# Patient Record
Sex: Male | Born: 1945 | Race: Black or African American | Hispanic: No | Smoking: Former smoker
Health system: Southern US, Community
[De-identification: ages and names within clinical notes are randomized; demographics above are authoritative.]

## PROBLEM LIST (undated history)

## (undated) DIAGNOSIS — E119 Type 2 diabetes mellitus without complications: Secondary | ICD-10-CM

## (undated) DIAGNOSIS — N400 Enlarged prostate without lower urinary tract symptoms: Secondary | ICD-10-CM

## (undated) DIAGNOSIS — I10 Essential (primary) hypertension: Secondary | ICD-10-CM

## (undated) DIAGNOSIS — C61 Malignant neoplasm of prostate: Secondary | ICD-10-CM

## (undated) DIAGNOSIS — Z87442 Personal history of urinary calculi: Secondary | ICD-10-CM

## (undated) DIAGNOSIS — Z720 Tobacco use: Secondary | ICD-10-CM

## (undated) DIAGNOSIS — F1010 Alcohol abuse, uncomplicated: Secondary | ICD-10-CM

## (undated) HISTORY — DX: Alcohol abuse, uncomplicated: F10.10

## (undated) HISTORY — DX: Essential (primary) hypertension: I10

## (undated) HISTORY — PX: APPENDECTOMY: SHX54

## (undated) HISTORY — DX: Malignant neoplasm of prostate: C61

## (undated) HISTORY — DX: Personal history of urinary calculi: Z87.442

## (undated) HISTORY — PX: RADIOACTIVE SEED IMPLANT: SHX5150

## (undated) HISTORY — PX: LITHOTRIPSY: SUR834

## (undated) HISTORY — DX: Benign prostatic hyperplasia without lower urinary tract symptoms: N40.0

## (undated) HISTORY — DX: Tobacco use: Z72.0

---

## 2003-04-27 ENCOUNTER — Ambulatory Visit: Admission: RE | Admit: 2003-04-27 | Discharge: 2003-07-26 | Payer: Self-pay | Admitting: Radiation Oncology

## 2003-05-06 ENCOUNTER — Encounter: Admission: RE | Admit: 2003-05-06 | Discharge: 2003-05-06 | Payer: Self-pay | Admitting: Urology

## 2003-06-18 DIAGNOSIS — C61 Malignant neoplasm of prostate: Secondary | ICD-10-CM

## 2003-06-18 HISTORY — DX: Malignant neoplasm of prostate: C61

## 2003-06-23 ENCOUNTER — Ambulatory Visit (HOSPITAL_COMMUNITY): Admission: RE | Admit: 2003-06-23 | Discharge: 2003-06-23 | Payer: Self-pay | Admitting: Urology

## 2003-06-23 ENCOUNTER — Ambulatory Visit (HOSPITAL_BASED_OUTPATIENT_CLINIC_OR_DEPARTMENT_OTHER): Admission: RE | Admit: 2003-06-23 | Discharge: 2003-06-23 | Payer: Self-pay | Admitting: Urology

## 2003-07-09 ENCOUNTER — Emergency Department (HOSPITAL_COMMUNITY): Admission: EM | Admit: 2003-07-09 | Discharge: 2003-07-09 | Payer: Self-pay | Admitting: Emergency Medicine

## 2006-02-14 ENCOUNTER — Inpatient Hospital Stay (HOSPITAL_COMMUNITY): Admission: AC | Admit: 2006-02-14 | Discharge: 2006-02-17 | Payer: Self-pay

## 2008-03-01 ENCOUNTER — Observation Stay (HOSPITAL_COMMUNITY): Admission: EM | Admit: 2008-03-01 | Discharge: 2008-03-01 | Payer: Self-pay | Admitting: Emergency Medicine

## 2008-07-26 ENCOUNTER — Encounter: Admission: RE | Admit: 2008-07-26 | Discharge: 2008-07-26 | Payer: Self-pay | Admitting: Family Medicine

## 2009-05-29 ENCOUNTER — Emergency Department (HOSPITAL_COMMUNITY): Admission: EM | Admit: 2009-05-29 | Discharge: 2009-05-29 | Payer: Self-pay | Admitting: Emergency Medicine

## 2010-02-19 ENCOUNTER — Observation Stay (HOSPITAL_COMMUNITY): Admission: EM | Admit: 2010-02-19 | Discharge: 2010-02-21 | Payer: Self-pay | Admitting: Emergency Medicine

## 2010-02-19 ENCOUNTER — Ambulatory Visit: Payer: Self-pay | Admitting: Internal Medicine

## 2010-02-20 ENCOUNTER — Encounter: Payer: Self-pay | Admitting: Internal Medicine

## 2010-02-20 LAB — CONVERTED CEMR LAB: PSA: 0.04 ng/mL

## 2010-02-23 ENCOUNTER — Encounter: Payer: Self-pay | Admitting: Internal Medicine

## 2010-02-23 DIAGNOSIS — C61 Malignant neoplasm of prostate: Secondary | ICD-10-CM | POA: Insufficient documentation

## 2010-02-23 DIAGNOSIS — F1010 Alcohol abuse, uncomplicated: Secondary | ICD-10-CM | POA: Insufficient documentation

## 2010-02-23 DIAGNOSIS — I10 Essential (primary) hypertension: Secondary | ICD-10-CM | POA: Insufficient documentation

## 2010-02-23 DIAGNOSIS — F17200 Nicotine dependence, unspecified, uncomplicated: Secondary | ICD-10-CM

## 2010-02-23 DIAGNOSIS — Z87898 Personal history of other specified conditions: Secondary | ICD-10-CM

## 2010-02-23 DIAGNOSIS — R55 Syncope and collapse: Secondary | ICD-10-CM

## 2010-02-26 ENCOUNTER — Ambulatory Visit: Payer: Self-pay | Admitting: Internal Medicine

## 2010-03-09 ENCOUNTER — Ambulatory Visit: Payer: Self-pay | Admitting: Internal Medicine

## 2010-03-10 LAB — CONVERTED CEMR LAB
HDL: 63 mg/dL (ref >39)
Triglycerides: 126 mg/dL (ref <150)

## 2010-03-27 ENCOUNTER — Telehealth: Payer: Self-pay | Admitting: Internal Medicine

## 2010-04-09 ENCOUNTER — Telehealth: Payer: Self-pay | Admitting: Internal Medicine

## 2010-07-17 NOTE — Progress Notes (Signed)
Summary: med refill/gp  Phone Note Refill Request Message from:  Hospice nurse on April 09, 2010 9:50 AM  Refills Requested: Medication #1:  METOPROLOL TARTRATE 50 MG TABS Take 1 tablet by mouth two times a day  Method Requested: Electronic Initial call taken by: Chinita Pester RN,  April 09, 2010 9:50 AM  Follow-up for Phone Call        Rx faxed to pharmacy Follow-up by: Mariea Stable MD,  April 09, 2010 10:38 AM    Prescriptions: METOPROLOL TARTRATE 50 MG TABS (METOPROLOL TARTRATE) Take 1 tablet by mouth two times a day  #60 x 2   Entered and Authorized by:   Mariea Stable MD   Signed by:   Mariea Stable MD on 04/09/2010   Method used:   [facility]        [facility]  [address] [phone]* (retail)       [address]       [place], [place]  [id number]       Ph: [phone] or [phone]       Fax: [phone]   RxID:   [id number]

## 2010-07-17 NOTE — Miscellaneous (Signed)
Summary: Clinical Problems Update - from hospital discharge 02/21/10  Clinical Lists Changes  Problems: Added new problem of ALCOHOL ABUSE (ICD-305.00) Added new problem of ESSENTIAL HYPERTENSION (ICD-401.9) Added new problem of BENIGN PROSTATIC HYPERTROPHY, HX OF (ICD-V13.8) Added new problem of Hospitalized for  SYNCOPE (ICD-780.2) - Thought to be vasovagal and/or orthostatic in nature secondary to increased dosage of doxazosin and dehydration Added new problem of ADENOCARCINOMA, PROSTATE (ICD-185) Added new problem of TOBACCO ABUSE (ICD-305.1) Medications: Added new medication of FLOMAX 0.4 MG CAPS (TAMSULOSIN HCL) Take 1 tablet by mouth once a day Added new medication of FINASTERIDE 5 MG TABS (FINASTERIDE) Take 1 tablet by mouth once a day Added new medication of METOPROLOL TARTRATE 50 MG TABS (METOPROLOL TARTRATE) Take 1 tablet by mouth two times a day Added new medication of LISINOPRIL 40 MG TABS (LISINOPRIL) Take 1 tablet by mouth once a day Observations: Added new observation of NKA: T (02/23/2010 15:46) Added new observation of PSA: 0.04 ng/mL (02/20/2010 15:46)

## 2010-07-17 NOTE — Progress Notes (Signed)
Summary: med refill/gp  Phone Note Refill Request Message from:  Fax from Pharmacy on March 27, 2010 9:52 AM  Refills Requested: Medication #1:  FINASTERIDE 5 MG TABS Take 1 tablet by mouth once a day   Last Refilled: 02/21/2010  Medication #2:  FLOMAX 0.4 MG CAPS Take 1 tablet by mouth once a day   Last Refilled: 02/21/2010 Last appt/ 9/12.   Method Requested: Electronic Initial call taken by: Chinita Pester RN,  March 27, 2010 9:52 AM  Follow-up for Phone Call        Rx faxed to pharmacy Follow-up by: Mariea Stable MD,  March 27, 2010 10:04 AM    Prescriptions: FINASTERIDE 5 MG TABS (FINASTERIDE) Take 1 tablet by mouth once a day  #30 x 2   Entered and Authorized by:   Mariea Stable MD   Signed by:   Mariea Stable MD on 03/27/2010   Method used:   [facility]        [facility] [phone]* (retail)       [address]       [place], [place]  [address]       Ph: [phone] or [phone]       Fax: [phone]   RxID:   [id number] FLOMAX 0.4 MG CAPS (TAMSULOSIN HCL) Take 1 tablet by mouth once a day  #30 x 2   Entered and Authorized by:   Mariea Stable MD   Signed by:   Mariea Stable MD on 03/27/2010   Method used:   [facility]        [facility]  [address] [phone]* (retail)       [address]       [place], [place]  [id number]       Ph: [phone] or [phone]       Fax: [phone]   RxID:   [id number]

## 2010-07-17 NOTE — Assessment & Plan Note (Signed)
Summary: HFU for syncope, HTN, BPH   Vital Signs:  Patient profile:   65 year old male Height:      66 inches (167.64 cm) Weight:      136.1 pounds (61.86 kg) BMI:     22.05 Temp:     97.0 degrees F (36.11 degrees C) oral Pulse rate:   68 / minute BP supine:   136 / 88 BP sitting:   138 / 86  (left arm) BP standing:   128 / 82 Cuff size:   regular  Vitals Entered By: Theotis Barrio NT II (February 26, 2010 2:08 PM) CC: Hospital follow-up of syncope, requesting flu shot today.  Is Patient Diabetic? No Pain Assessment Patient in pain? no      Nutritional Status BMI of 19 -24 = normal  Have you ever been in a relationship where you felt threatened, hurt or afraid?No   Does patient need assistance? Functional Status Self care Ambulation Normal Comments HOSPITAL FOLLOW UP APPT   Primary Care Provider:  Johnette Abraham DO  CC:  Hospital follow-up of syncope and requesting flu shot today. Marland Kitchen  History of Present Illness: Patient is a 65 year old man with a PMHx of HTN, BPH, remote history of prostate cancer, and recent hospitalization for syncope 09/05-09/07/11, who presents to clinic today to establish care and for hospital follow-up of the following conditions:  1) Hospital follow-up: Patient was hospitalized from 09/05-09/07/11 at [facility] for workup of syncope x2 episodes that occured after patient urinated. Prior to admission patient admitted to increasing dosage of Doxazosin to 8mg  twice daily, and had multiple episodes of diarrhea and vomiting after syncopal episodes. Cardiac enzymes negative x 3 . Orthostatic vital signs inpatient were positive, and syncopal episodes contributed to by increased medication and orthostatic hypotension due to volume depletion (secondary to diarrhea and vomiting). Medications were adjusted inpatient (discontinued of Amlodipine 10mg  daily, Metoprolol-HCTZ 50-25mg  daily, Doxazosin 4mg  qHS, Flexeril), Patient also notes he did not continue with  discharge Lisinopril because he was not given a prescription for this medication.  Since discharge, patient states he is feeling much better without recurrent episodes of dizziness or lightheadedness, no syncopal episodes. No diarrhea, vomiting, abdominal pain.  2) BPH - states previously having difficulty with urination, which is why he had increased his Doxazosin dosage. However, since changing to Finasteride and Tamsulosin, he is having good stream, no difficulty voiding, no feeling of urinary retention, dysuria, hematuria.  3) Hypertension - taking Metoprolol two times a day, does not check blood pressure regularly. No chest pain, difficulty breathing, shortness of breath, claudication.   Preventive Screening-Counseling & Management  Alcohol-Tobacco     Smoking Status: current  Caffeine-Diet-Exercise     Does Patient Exercise: yes     Type of exercise: WALKING     Times/week: 5      Drug Use:  no.    Current Medications (verified): 1)  Flomax 0.4 Mg Caps (Tamsulosin Hcl) .... Take 1 Tablet By Mouth Once A Day 2)  Finasteride 5 Mg Tabs (Finasteride) .... Take 1 Tablet By Mouth Once A Day 3)  Metoprolol Tartrate 50 Mg Tabs (Metoprolol Tartrate) .... Take 1 Tablet By Mouth Two Times A Day  Allergies (verified): No Known Drug Allergies  Past History:  Past Medical History: 1)  Hypertension since 2005 2)  Prostate Cancer - diagnosed 2005 Clinical stage T1C, Gleason grade 6.  - Status-post Transperineal radioactive seed implantation with I-125 isotope in 2005. - Followed yearly by oncology,  Dr. Laverle Patter, at [facility]  3)  Benign Prostatic Hypertrophy since 2000-2001 4)  Tobacco Abuse - off and on x 4-5 years (2005 start) - 1 pack per month 5)  Remote history of Alcohol abuse since teenage years - since 2010 drinking 40 oz beer daily (greater amount previously) 6)  Remote history of nephrolithiasis requiring lithotripsy - unknown date  Past Surgical History: None  Family  History: Father - deceased, unknown Mother - deceased, unknown 23 siblings - unknown medical problems Maternal 1st cousin - leukemia 5 kids (3 daughters, 2 sons) - no known medical problems   Social History: Occupation: Administrator, Civil Service - Corporate investment banker Married, 5 children Current Smoker Alcohol use-yes Drug use-no Regular exercise-yes Smoking Status:  current Does Patient Exercise:  yes Drug Use:  no  Review of Systems       Per HPI  Physical Exam  General:  Well-developed,well-nourished,in no acute distress; alert,appropriate and cooperative throughout examination Head:  Normocephalic and atraumatic without obvious abnormalities. No apparent alopecia or balding. Neck:  No deformities, masses, or tenderness noted. Lungs:  Normal respiratory effort, chest expands symmetrically. Lungs are clear to auscultation, no crackles or wheezes. Heart:  Normal rate and regular rhythm. S1 and S2 normal without gallop, murmur, click, rub or other extra sounds. Abdomen:  Bowel sounds positive,abdomen soft and non-tender without masses, organomegaly or hernias noted. Msk:  No deformity or scoliosis noted of thoracic or lumbar spine.   Pulses:  R and L carotid,radial,femoral,dorsalis pedis and posterior tibial pulses are full and equal bilaterally   Impression & Recommendations:  Problem # 1:  Hosp for SYNCOPE (ICD-780.2) No further syncopal episodes, dizziness, or lightheadedness. As detailed in discharge summary, syncopal episode was likely secondary to vasovagal versus orthostatic hypotension secondary to medication misuse and/or volume depletion secondary to diarrhea and vomiting. - Patient instructed to follow medication prescriptions as instructed and to avoid increasing medications without medical professional approval.  Problem # 2:  ESSENTIAL HYPERTENSION (ICD-401.9) Well controlled.  - Will continue to monitor and consider adding back Lisinopril as needed if blood pressure becomes  poorly controlled. - Will add Aspirin 81mg  TBEC daily  The following medications were removed from the medication list:    Lisinopril 40 Mg Tabs (Lisinopril) .Marland Kitchen... Take 1 tablet by mouth once a day His updated medication list for this problem includes:    Metoprolol Tartrate 50 Mg Tabs (Metoprolol tartrate) .Marland Kitchen... Take 1 tablet by mouth two times a day  Problem # 3:  BENIGN PROSTATIC HYPERTROPHY, HX OF (ICD-V13.8) Well controlled. Followed by Urology.  - Patient instructed to let Urologist know about medication changes encountered during hospitalization, since patient on multiple medications for BPH currently, may consider changing to either Finasteride or Tamsulosin per discretion of Urologist.  - Recommend keep urology follow-up 04/2010 - Requested paperwork from Urologist regarding PSA testing  Problem # 4:  TOBACCO ABUSE (ICD-305.1) Recommended to stop smoking and patient does not request medication help at this time, will let me know.  Problem # 5:  PREVENTIVE HEALTH CARE (ICD-V70.0) Requested paperwork from PCP, Urologist regarding health screening (including prior colonoscopy, PSA, tetanus, PPD results) - Will schedule for appropriate labs/ testing as indicated after records received and reviewed.  - Influenza and Pneumococcal vaccinations today - Plan Fasting Lipid Profile prior to next visit in 3 months.   Future Orders: T-Lipid Profile [phone]) ... 05/28/2010  Complete Medication List: 1)  Flomax 0.4 Mg Caps (Tamsulosin hcl) .... Take 1 tablet by mouth once a  day 2)  Finasteride 5 Mg Tabs (Finasteride) .... Take 1 tablet by mouth once a day 3)  Metoprolol Tartrate 50 Mg Tabs (Metoprolol tartrate) .... Take 1 tablet by mouth two times a day 4)  Aspir-low 81 Mg Tbec (Aspirin) .... Take 1 tablet by mouth once a day  Other Orders: Influenza Vaccine NON MCR (04540) Pneumococcal Vaccine (98119) Admin 1st Vaccine (14782)  Patient Instructions: 1)  Please follow-up with  your Urologist Dr. Laverle Patter at [facility] regarding your prostate cancer, prostate enlargement, and the medication changes that were made to your prostate medications.  2)  Please send paperwork from your Urology visit to our clinic. 3)  We will request paperwork to be sent from your old primary care provider, at which time we can determine when your next screening tests (including colonoscopy, PSA, etc.) need to be scheduled.  4)  Please continue current medications as directed. 5)  As discussed, please stop smoking, as it is very bad for your health. 6)  Please consider decreasing the amount of alcohol your currently use daily, as it can be bad for your health and liver.  7)  Please follow-up with me in 3 months time for a routine follow-up, before the visit, please get a fasting cholesterol panel (so you cannot eat or drink anything after midnight the night before your visit, then come in the morning for a visit). Prescriptions: ASPIR-LOW 81 MG TBEC (ASPIRIN) Take 1 tablet by mouth once a day  #30 x 0   Entered and Authorized by:   Johnette Abraham DO   Signed by:   Johnette Abraham DO on 02/26/2010   Method used:   Telephoned to ...         RxID:   [id number]  Process Orders Check Orders Results:     [facility]: ABN not required for this insurance Tests Sent for requisitioning (February 27, 2010 6:01 PM):     05/28/2010: [facility] -- T-Lipid Profile [phone] (signed)     Prevention & Chronic Care Immunizations   Influenza vaccine: Fluvax Non-MCR  (02/26/2010)    Tetanus booster: Not documented    Pneumococcal vaccine: Pneumovax  (02/26/2010)    H. zoster vaccine: Not documented  Colorectal Screening   Hemoccult: Not documented    Colonoscopy: Not documented  Other Screening   PSA: 0.04  (02/20/2010)   Smoking status: current  (02/26/2010)  Lipids   Total Cholesterol: Not documented   LDL: Not documented   LDL  Direct: Not documented   HDL: Not documented   Triglycerides: Not documented  Hypertension   Last Blood Pressure: 138 / 86  (02/26/2010)   Serum creatinine: Not documented   Serum potassium Not documented  Self-Management Support :   Personal Goals (by the next clinic visit) :      Personal blood pressure goal: 140/90  (02/26/2010)   Patient will work on the following items until the next clinic visit to reach self-care goals:     Medications and monitoring: take my medicines every day  (02/26/2010)     Eating: eat more vegetables, use fresh or frozen vegetables, eat foods that are low in salt, eat baked foods instead of fried foods, eat fruit for snacks and desserts, limit or avoid alcohol  (02/26/2010)     Activity: take a 30 minute walk every day  (02/26/2010)    Hypertension self-management support: Resources for patients handout  (02/26/2010)    Self-management comments: JPATIENT STATES THAT HE WALKS TO  WORK EVERYDAY      Resource handout printed.   Nursing Instructions: Give Flu vaccine today Give Pneumovax today    Pneumovax Vaccine    Vaccine Type: Pneumovax    Site: right deltoid    Mfr: Merck    Dose: 0.5 ml    Route: IM    Given by: Chinita Pester RN    Exp. Date: 08/30/2011    Lot #: [id number]    VIS given: 05/22/09 version given February 26, 2010.  Influenza Vaccine    Vaccine Type: Fluvax Non-MCR    Site: left deltoid    Mfr: GlaxoSmithKline    Dose: 0.5 ml    Route: IM    Given by: Chinita Pester RN    Exp. Date: 12/15/2010    Lot #: [id number]    VIS given: 01/09/10 version given February 26, 2010.  Flu Vaccine Consent Questions    Do you have a history of severe allergic reactions to this vaccine? no    Any prior history of allergic reactions to egg and/or gelatin? no    Do you have a sensitivity to the preservative Thimersol? no    Do you have a past history of Guillan-Barre Syndrome? no    Do you currently have an acute febrile illness? no     Have you ever had a severe reaction to latex? no    Vaccine information given and explained to patient? yes

## 2010-08-16 ENCOUNTER — Other Ambulatory Visit: Payer: Self-pay | Admitting: Internal Medicine

## 2010-08-30 LAB — BASIC METABOLIC PANEL
BUN: 9 mg/dL (ref 6–23)
Chloride: 99 mEq/L (ref 96–112)
GFR calc non Af Amer: >60 mL/min (ref >60)
Potassium: 3.7 mEq/L (ref 3.5–5.1)
Sodium: 138 mEq/L (ref 135–145)

## 2010-08-30 LAB — RAPID URINE DRUG SCREEN, HOSP PERFORMED
Amphetamines: NOT DETECTED
Barbiturates: NOT DETECTED
Benzodiazepines: NOT DETECTED
Cocaine: NOT DETECTED
Opiates: NOT DETECTED
Tetrahydrocannabinol: NOT DETECTED

## 2010-08-30 LAB — CBC
HCT: 38.7 % — ABNORMAL LOW (ref 39.0–52.0)
HCT: 40.4 % (ref 39.0–52.0)
Hemoglobin: 13.6 g/dL (ref 13.0–17.0)
Hemoglobin: 13.8 g/dL (ref 13.0–17.0)
MCH: 33.7 pg (ref 26.0–34.0)
MCH: 34.3 pg — ABNORMAL HIGH (ref 26.0–34.0)
MCHC: 34.2 g/dL (ref 30.0–36.0)
MCHC: 35.1 g/dL (ref 30.0–36.0)
MCV: 97.7 fL (ref 78.0–100.0)
MCV: 98.5 fL (ref 78.0–100.0)
Platelets: 186 K/uL (ref 150–400)
Platelets: 188 K/uL (ref 150–400)
RBC: 3.96 MIL/uL — ABNORMAL LOW (ref 4.22–5.81)
RBC: 4.1 MIL/uL — ABNORMAL LOW (ref 4.22–5.81)
RDW: 13 % (ref 11.5–15.5)
RDW: 13.2 % (ref 11.5–15.5)
WBC: 4.7 K/uL (ref 4.0–10.5)
WBC: 4.8 10*3/uL (ref 4.0–10.5)

## 2010-08-30 LAB — POCT CARDIAC MARKERS
CKMB, poc: <1 ng/mL — ABNORMAL LOW (ref 1.0–8.0)
Myoglobin, poc: 112 ng/mL (ref 12–200)
Troponin i, poc: <0.05 ng/mL (ref 0.00–0.09)

## 2010-08-30 LAB — CARDIAC PANEL(CRET KIN+CKTOT+MB+TROPI)
CK, MB: 1.9 ng/mL (ref 0.3–4.0)
CK, MB: 2.6 ng/mL (ref 0.3–4.0)
Relative Index: 1 (ref 0.0–2.5)
Relative Index: 1.1 (ref 0.0–2.5)
Total CK: 194 U/L (ref 7–232)
Total CK: 240 U/L — ABNORMAL HIGH (ref 7–232)
Troponin I: 0.01 ng/mL (ref 0.00–0.06)
Troponin I: 0.02 ng/mL (ref 0.00–0.06)

## 2010-08-30 LAB — BASIC METABOLIC PANEL WITH GFR
CO2: 32 meq/L (ref 19–32)
Calcium: 9.1 mg/dL (ref 8.4–10.5)
Creatinine, Ser: 1.1 mg/dL (ref 0.4–1.5)
GFR calc Af Amer: >60 mL/min (ref >60)
Glucose, Bld: 110 mg/dL — ABNORMAL HIGH (ref 70–99)

## 2010-08-30 LAB — ETHANOL: Alcohol, Ethyl (B): <5 mg/dL (ref 0–10)

## 2010-08-30 LAB — COMPREHENSIVE METABOLIC PANEL WITH GFR
ALT: 15 U/L (ref 0–53)
Albumin: 3.6 g/dL (ref 3.5–5.2)
Alkaline Phosphatase: 40 U/L (ref 39–117)
BUN: 11 mg/dL (ref 6–23)
Chloride: 98 meq/L (ref 96–112)
Glucose, Bld: 94 mg/dL (ref 70–99)
Potassium: 4 meq/L (ref 3.5–5.1)
Total Bilirubin: 0.7 mg/dL (ref 0.3–1.2)

## 2010-08-30 LAB — COMPREHENSIVE METABOLIC PANEL
AST: 24 U/L (ref 0–37)
CO2: 28 mEq/L (ref 19–32)
Calcium: 9 mg/dL (ref 8.4–10.5)
Creatinine, Ser: 1.15 mg/dL (ref 0.4–1.5)
GFR calc Af Amer: >60 mL/min (ref >60)
GFR calc non Af Amer: >60 mL/min (ref >60)
Sodium: 137 mEq/L (ref 135–145)
Total Protein: 6.6 g/dL (ref 6.0–8.3)

## 2010-08-30 LAB — DIFFERENTIAL
Basophils Absolute: 0 10*3/uL (ref 0.0–0.1)
Basophils Relative: 0 % (ref 0–1)
Eosinophils Absolute: 0.1 K/uL (ref 0.0–0.7)
Eosinophils Relative: 2 % (ref 0–5)
Lymphocytes Relative: 15 % (ref 12–46)
Lymphs Abs: 0.7 10*3/uL (ref 0.7–4.0)
Monocytes Absolute: 0.4 10*3/uL (ref 0.1–1.0)
Monocytes Relative: 7 % (ref 3–12)
Neutro Abs: 3.6 10*3/uL (ref 1.7–7.7)
Neutrophils Relative %: 75 % (ref 43–77)

## 2010-08-30 LAB — TROPONIN I: Troponin I: 0.01 ng/mL (ref 0.00–0.06)

## 2010-08-30 LAB — GLUCOSE, CAPILLARY: Glucose-Capillary: 123 mg/dL — ABNORMAL HIGH (ref 70–99)

## 2010-08-30 LAB — VITAMIN B12: Vitamin B-12: 225 pg/mL (ref 211–911)

## 2010-08-30 LAB — CK TOTAL AND CKMB (NOT AT ARMC)
CK, MB: 1.6 ng/mL (ref 0.3–4.0)
Total CK: 185 U/L (ref 7–232)

## 2010-09-18 LAB — CBC
HCT: 44 % (ref 39.0–52.0)
MCHC: 34.4 g/dL (ref 30.0–36.0)
Platelets: 141 10*3/uL — ABNORMAL LOW (ref 150–400)
RDW: 13.3 % (ref 11.5–15.5)

## 2010-09-18 LAB — COMPREHENSIVE METABOLIC PANEL
Albumin: 3.8 g/dL (ref 3.5–5.2)
Alkaline Phosphatase: 54 U/L (ref 39–117)
BUN: 15 mg/dL (ref 6–23)
Calcium: 9.4 mg/dL (ref 8.4–10.5)
Creatinine, Ser: 1.4 mg/dL (ref 0.4–1.5)
Glucose, Bld: 224 mg/dL — ABNORMAL HIGH (ref 70–99)
Potassium: 2.9 mEq/L — ABNORMAL LOW (ref 3.5–5.1)
Total Protein: 7.9 g/dL (ref 6.0–8.3)

## 2010-09-18 LAB — URINE MICROSCOPIC-ADD ON

## 2010-09-18 LAB — URINALYSIS, ROUTINE W REFLEX MICROSCOPIC
Glucose, UA: NEGATIVE mg/dL
Hgb urine dipstick: NEGATIVE
Ketones, ur: 15 mg/dL — AB
Leukocytes, UA: NEGATIVE
Nitrite: NEGATIVE
Protein, ur: 30 mg/dL — AB
Specific Gravity, Urine: 1.026 (ref 1.005–1.030)
Urobilinogen, UA: 1 mg/dL (ref 0.0–1.0)
pH: 5.5 (ref 5.0–8.0)

## 2010-09-18 LAB — DIFFERENTIAL
Lymphocytes Relative: 5 % — ABNORMAL LOW (ref 12–46)
Lymphs Abs: 0.3 10*3/uL — ABNORMAL LOW (ref 0.7–4.0)
Monocytes Absolute: 0.1 10*3/uL (ref 0.1–1.0)
Monocytes Relative: 2 % — ABNORMAL LOW (ref 3–12)
Neutro Abs: 5.1 10*3/uL (ref 1.7–7.7)
Neutrophils Relative %: 93 % — ABNORMAL HIGH (ref 43–77)

## 2010-09-18 LAB — MAGNESIUM: Magnesium: 1.5 mg/dL (ref 1.5–2.5)

## 2010-10-06 ENCOUNTER — Other Ambulatory Visit: Payer: Self-pay | Admitting: Internal Medicine

## 2010-10-11 ENCOUNTER — Other Ambulatory Visit: Payer: Self-pay | Admitting: Internal Medicine
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
## 2010-11-02 NOTE — Op Note (Signed)
NAMEVERDELL, DYKMAN                           ACCOUNT NO.:  [id number]   MEDICAL RECORD NO.:  [id number]                   PATIENT TYPE:  AMB   LOCATION:  [facility]                                 FACILITY:  [facility]   PHYSICIAN:  Claudette Laws, M.D.               DATE OF BIRTH:  05-26-1946   DATE OF PROCEDURE:  06/23/2003  DATE OF DISCHARGE:                                 OPERATIVE REPORT   PREOPERATIVE DIAGNOSIS:  Clinical stage T1C, Gleason grade 6 carcinoma of  the prostate gland.   POSTOPERATIVE DIAGNOSIS:  Clinical stage T1C, Gleason grade 6 carcinoma of  the prostate gland.   OPERATION:  1. Transperineal radioactive seed implantation with I-125 isotope.  2. Cystoscopy.   SURGEON:  Claudette Laws, M.D.   PROCEDURE:  The patient was prepped and draped in the dorsal lithotomy  position under spinal anesthesia.  A Foley catheter was placed.  A rectal  tube was in place, and then with Dr. Chipper Herb, radiation oncologist,  using the preop volume study, seeds were placed transperineal in the  standard fashion using the ultrasound rectal probe and over-arm C-arm for  placement.  A total of 27 needles were used and 86 seeds.  The procedure  went well.  At the end of the procedure cystoscopy was performed.  He had a  normal anterior urethra and normal prostate.  The bladder itself looked  normal, no tumors, no calculi, no seeds in the gland.  Normal ureteral  orifices.   I replaced a #16 [place], 5 mL Foley catheter, which the patient will remove  tomorrow.  He was then taken back to the recovery room in satisfactory  condition.                                               Claudette Laws, M.D.    RFS/MEDQ  D:  06/23/2003  T:  06/23/2003  Job:  [id number]
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
## 2010-11-20 ENCOUNTER — Encounter: Payer: Self-pay | Admitting: Internal Medicine

## 2010-11-20 ENCOUNTER — Other Ambulatory Visit: Payer: Self-pay | Admitting: Internal Medicine

## 2010-11-27 ENCOUNTER — Other Ambulatory Visit: Payer: Self-pay | Admitting: Internal Medicine

## 2011-01-04 ENCOUNTER — Other Ambulatory Visit: Payer: Self-pay | Admitting: Internal Medicine

## 2011-03-18 LAB — BASIC METABOLIC PANEL WITH GFR
BUN: 7
CO2: 28
Calcium: 9
Chloride: 105
Creatinine, Ser: 1.13
GFR calc non Af Amer: >60
Glucose, Bld: 114 — ABNORMAL HIGH
Potassium: 3.5
Sodium: 141

## 2011-03-18 LAB — CBC
Hemoglobin: 11.9 — ABNORMAL LOW
MCHC: 34.9
MCV: 101.3 — ABNORMAL HIGH
RBC: 3.37 — ABNORMAL LOW
WBC: 5.4

## 2011-03-20 LAB — POCT I-STAT, CHEM 8
BUN: 12
Calcium, Ion: 1.04 — ABNORMAL LOW
Chloride: 104
Glucose, Bld: 109 — ABNORMAL HIGH
HCT: 40
Potassium: 3.5

## 2011-03-20 LAB — PROTIME-INR
INR: 1
Prothrombin Time: 13.2

## 2012-03-24 ENCOUNTER — Emergency Department (INDEPENDENT_AMBULATORY_CARE_PROVIDER_SITE_OTHER)
Admission: EM | Admit: 2012-03-24 | Discharge: 2012-03-24 | Disposition: A | Discharge status: Home / Self Care | Payer: Medicare Other | Source: Home / Self Care | Attending: Family Medicine | Admitting: Family Medicine

## 2012-03-24 ENCOUNTER — Encounter (HOSPITAL_COMMUNITY): Payer: Self-pay | Admitting: Emergency Medicine

## 2012-03-24 DIAGNOSIS — I10 Essential (primary) hypertension: Secondary | ICD-10-CM

## 2012-03-24 LAB — POCT I-STAT, CHEM 8
BUN: 8 mg/dL (ref 6–23)
Calcium, Ion: 1.13 mmol/L (ref 1.13–1.30)
Chloride: 103 mEq/L (ref 96–112)
Creatinine, Ser: 1.2 mg/dL (ref 0.50–1.35)
Glucose, Bld: 95 mg/dL (ref 70–99)
HCT: 43 % (ref 39.0–52.0)
Hemoglobin: 14.6 g/dL (ref 13.0–17.0)
Sodium: 139 mEq/L (ref 135–145)

## 2012-03-24 MED ORDER — METOPROLOL TARTRATE 50 MG PO TABS: 50.0000 mg | ORAL_TABLET | Freq: Two times a day (BID) | ORAL | Status: DC

## 2012-03-24 MED ORDER — CLONIDINE HCL 0.1 MG PO TABS
0.1000 mg | ORAL_TABLET | Freq: Once | ORAL | Status: AC
  Administered 2012-03-24: 0.1 mg via ORAL

## 2012-03-24 MED ORDER — FUROSEMIDE 40 MG PO TABS
40.0000 mg | ORAL_TABLET | Freq: Once | ORAL | Status: AC
  Administered 2012-03-24: 40 mg via ORAL

## 2012-03-24 MED ORDER — LISINOPRIL-HYDROCHLOROTHIAZIDE 20-12.5 MG PO TABS: 1.0000 | ORAL_TABLET | Freq: Every day | ORAL | Status: DC

## 2012-03-24 MED ORDER — CLONIDINE HCL 0.1 MG PO TABS
ORAL_TABLET | ORAL | Status: AC
  Filled 2012-03-24: qty 1

## 2012-03-24 MED ORDER — FUROSEMIDE 40 MG PO TABS
ORAL_TABLET | ORAL | Status: AC
  Filled 2012-03-24: qty 1

## 2012-03-24 NOTE — ED Provider Notes (Signed)
History     CSN: [id number]  Arrival date & time 03/24/12  1055   First MD Initiated Contact with Patient 03/24/12 1058      Chief Complaint  Patient presents with  . Hypertension    (Consider location/radiation/quality/duration/timing/severity/associated sxs/prior treatment) Patient is a 66 y.o. male presenting with hypertension. The history is provided by the patient.  Hypertension This is a chronic problem. The current episode started more than 1 week ago (has been out of bp med for over 1 mo, found today to have very high bp and sl headache.). The problem has been gradually worsening. Associated symptoms include headaches.    Past Medical History  Diagnosis Date  . Hypertension   . Prostate cancer 2005  . BPH (benign prostatic hypertrophy) 2000-2001  . Tobacco abuse   . Alcohol abuse   . History of nephrolithiasis   . Kidney stones     Past Surgical History  Procedure Date  . Lithotripsy   . Appendectomy     Family History  Problem Relation Age of Onset  . Cancer Cousin     History  Substance Use Topics  . Smoking status: Current Every Day Smoker  . Smokeless tobacco: Not on file  . Alcohol Use: Yes      Review of Systems  Constitutional: Negative.   Respiratory: Negative.   Cardiovascular: Negative.   Gastrointestinal: Negative.   Neurological: Positive for headaches.    Allergies  Review of patient's allergies indicates no known allergies.  Home Medications   Current Outpatient Rx  Name Route Sig Dispense Refill  . ASPIRIN 81 MG PO TBEC Oral Take 81 mg by mouth daily.      Marland Kitchen FINASTERIDE 5 MG PO TABS  TAKE 1 TABLET BY MOUTH EVERY DAY 30 tablet 2  . LISINOPRIL-HYDROCHLOROTHIAZIDE 20-12.5 MG PO TABS Oral Take 1 tablet by mouth daily. 30 tablet 1  . METOPROLOL TARTRATE 50 MG PO TABS  TAKE 1 TABLET BY MOUTH TWICE A DAY 60 tablet 2  . METOPROLOL TARTRATE 50 MG PO TABS Oral Take 1 tablet (50 mg total) by mouth 2 (two) times daily. 30 tablet 1  .  TAMSULOSIN HCL 0.4 MG PO CAPS Oral Take by mouth daily.      Marland Kitchen TAMSULOSIN HCL 0.4 MG PO CAPS  TAKE ONE CAPSULE BY MOUTH EVERY DAY 30 capsule 2  . TAMSULOSIN HCL 0.4 MG PO CAPS  TAKE ONE CAPSULE BY MOUTH EVERY DAY 30 capsule 2    BP 229/124  Pulse 84  Temp 97.9 F (36.6 C) (Oral)  Resp 18  SpO2 98%  Physical Exam  Nursing note and vitals reviewed. Constitutional: He is oriented to person, place, and time. He appears well-developed and well-nourished.  HENT:  Head: Normocephalic.  Left Ear: External ear normal.  Mouth/Throat: Oropharynx is clear and moist.  Eyes: Conjunctivae normal are normal. Pupils are equal, round, and reactive to light.  Neck: Normal range of motion. Neck supple.  Cardiovascular: Normal rate, regular rhythm, normal heart sounds and intact distal pulses.   Pulmonary/Chest: Effort normal and breath sounds normal.  Musculoskeletal: He exhibits no edema.  Neurological: He is alert and oriented to person, place, and time.  Skin: Skin is warm and dry.  Psychiatric: He has a normal mood and affect.    ED Course  Procedures (including critical care time)   Labs Reviewed  POCT I-STAT, CHEM 8   No results found.   1. Hypertension, essential  MDM  i-stat wnl.        Linna Hoff, MD 03/24/12 [phone]

## 2012-03-24 NOTE — ED Notes (Signed)
Patient he has been out of blood pressure medicine for a month.  [facility]- sent patient here for evaluation.  C/o minor headache for a few weeks

## 2012-06-18 NOTE — ED Notes (Signed)
Call from friend, asking for BP medication refill; was advised to f/u w MD at hospice for his continued medication refills as this is not a service we provide

## 2012-08-03 ENCOUNTER — Emergency Department (INDEPENDENT_AMBULATORY_CARE_PROVIDER_SITE_OTHER)
Admission: EM | Admit: 2012-08-03 | Discharge: 2012-08-03 | Disposition: A | Discharge status: Home / Self Care | Payer: Medicare Other | Source: Home / Self Care

## 2012-08-03 ENCOUNTER — Encounter (HOSPITAL_COMMUNITY): Payer: Self-pay | Admitting: Emergency Medicine

## 2012-08-03 DIAGNOSIS — I10 Essential (primary) hypertension: Secondary | ICD-10-CM

## 2012-08-03 MED ORDER — LISINOPRIL-HYDROCHLOROTHIAZIDE 20-12.5 MG PO TABS: 1.0000 | ORAL_TABLET | Freq: Every day | ORAL | Status: DC

## 2012-08-03 MED ORDER — METOPROLOL TARTRATE 50 MG PO TABS: ORAL_TABLET | ORAL | Status: DC

## 2012-08-03 NOTE — ED Notes (Signed)
Adv Hayden Rasmussen, NP of abn vitals

## 2012-08-03 NOTE — ED Notes (Signed)
Waiting for provider to sign up for pt to give abn vitals.

## 2012-08-03 NOTE — ED Notes (Signed)
Pt is here for hypertension and to have meds refilled.  Denies any medical problems Takes Toprol 50mg  and Lisinopril/hctz 20/12.5mg   He is alert w/no signs of acute distress.

## 2012-08-03 NOTE — ED Provider Notes (Signed)
History     CSN: [id number]  Arrival date & time 08/03/12  1111   First MD Initiated Contact with Patient 08/03/12 1252      Chief Complaint  Patient presents with  . Hypertension    (Consider location/radiation/quality/duration/timing/severity/associated sxs/prior treatment) HPI Comments: 67 year old male who states he is here for his routine monthly refills of antihypertensives. He had been seeing a physician at another location and for whatever reason came to the urgent care in October was given prescriptions for his medications by another provider at [facility] urgent care center. He Is requesting refills of lisinopril and metoprolol. His only complaint is that of a mild headache on review of systems. Otherwise no neurologic complaints or deficits. Denies chest pain, shortness of breath, abdominal pain, back pain, edema or other complaints.  Patient is a 67 y.o. male presenting with hypertension.  Hypertension Associated symptoms include headaches. Pertinent negatives include no chest pain and no shortness of breath.    Past Medical History  Diagnosis Date  . Hypertension   . Prostate cancer 2005  . BPH (benign prostatic hypertrophy) 2000-2001  . Tobacco abuse   . Alcohol abuse   . History of nephrolithiasis   . Kidney stones     Past Surgical History  Procedure Laterality Date  . Lithotripsy    . Appendectomy      Family History  Problem Relation Age of Onset  . Cancer Cousin     History  Substance Use Topics  . Smoking status: Current Every Day Smoker  . Smokeless tobacco: Not on file  . Alcohol Use: Yes      Review of Systems  Constitutional: Negative for fever, diaphoresis, activity change and fatigue.  HENT: Negative for ear pain, congestion, sore throat, neck pain and neck stiffness.   Eyes: Negative.   Respiratory: Negative for cough, chest tightness, shortness of breath and wheezing.   Cardiovascular: Negative for chest pain, palpitations and  leg swelling.  Gastrointestinal: Negative.   Genitourinary: Negative.   Skin: Negative for color change and rash.  Neurological: Positive for headaches.       Minor headache today only.  Psychiatric/Behavioral: Negative.  Negative for behavioral problems.    Allergies  Review of patient's allergies indicates no known allergies.  Home Medications   Current Outpatient Rx  Name  Route  Sig  Dispense  Refill  . lisinopril-hydrochlorothiazide (PRINZIDE,ZESTORETIC) 20-12.5 MG per tablet   Oral   Take 1 tablet by mouth daily.   30 tablet   1   . metoprolol (LOPRESSOR) 50 MG tablet      TAKE 1 TABLET BY MOUTH TWICE A DAY   60 tablet   2   . aspirin (ASPIR-LOW) 81 MG EC tablet   Oral   Take 81 mg by mouth daily.           . finasteride (PROSCAR) 5 MG tablet      TAKE 1 TABLET BY MOUTH EVERY DAY   30 tablet   2   . lisinopril-hydrochlorothiazide (PRINZIDE) 20-12.5 MG per tablet   Oral   Take 1 tablet by mouth daily.   30 tablet   0   . metoprolol (LOPRESSOR) 50 MG tablet   Oral   Take 1 tablet (50 mg total) by mouth 2 (two) times daily.   30 tablet   1   . metoprolol (LOPRESSOR) 50 MG tablet      Take one tablet BID for BP   60 tablet   0   .  Tamsulosin HCl (FLOMAX) 0.4 MG CAPS   Oral   Take by mouth daily.           . Tamsulosin HCl (FLOMAX) 0.4 MG CAPS      TAKE ONE CAPSULE BY MOUTH EVERY DAY   30 capsule   2   . Tamsulosin HCl (FLOMAX) 0.4 MG CAPS      TAKE ONE CAPSULE BY MOUTH EVERY DAY   30 capsule   2     BP 175/95  Pulse 80  Temp(Src) 98.6 F (37 C) (Oral)  Resp 20  SpO2 99%  Physical Exam  Nursing note and vitals reviewed. Constitutional: He is oriented to person, place, and time. He appears well-developed and well-nourished. No distress.  HENT:  Head: Normocephalic and atraumatic.  Eyes: Conjunctivae and EOM are normal.  Neck: Normal range of motion. Neck supple.  Cardiovascular: Normal rate, regular rhythm and normal heart  sounds.   Pulmonary/Chest: Effort normal and breath sounds normal. No respiratory distress. He has no wheezes. He has no rales.  Musculoskeletal: Normal range of motion. He exhibits no edema and no tenderness.  Lymphadenopathy:    He has no cervical adenopathy.  Neurological: He is alert and oriented to person, place, and time. No cranial nerve deficit.  Skin: Skin is warm and dry. No rash noted.  Psychiatric: He has a normal mood and affect.    ED Course  Procedures (including critical care time)  Labs Reviewed - No data to display No results found.   1. HTN (hypertension)       MDM  Patient is stable discharged asymptomatic. Refill Zestoretic 20/12.5 one daily for blood pressure Metoprolol tartrate 50 mg twice a day for blood pressure He is to obtain a primary care physician as soon as possible. He does have Medicare and Medicaid to my understanding.          Hayden Rasmussen, NP 08/03/12 1330

## 2012-08-03 NOTE — ED Provider Notes (Signed)
Medical screening examination/treatment/procedure(s) were performed by non-physician practitioner and as supervising physician I was immediately available for consultation/collaboration.  Leslee Home, M.D.  Reuben Likes, MD 08/03/12 2138

## 2014-09-01 ENCOUNTER — Other Ambulatory Visit: Payer: Self-pay

## 2014-09-01 ENCOUNTER — Encounter (HOSPITAL_COMMUNITY): Payer: Self-pay

## 2014-09-01 ENCOUNTER — Emergency Department (HOSPITAL_COMMUNITY): Payer: Medicare PPO

## 2014-09-01 ENCOUNTER — Emergency Department (HOSPITAL_COMMUNITY)
Admission: EM | Admit: 2014-09-01 | Discharge: 2014-09-02 | Disposition: A | Discharge status: Home / Self Care | Payer: Medicare PPO | Attending: Emergency Medicine | Admitting: Emergency Medicine

## 2014-09-01 DIAGNOSIS — Y9289 Other specified places as the place of occurrence of the external cause: Secondary | ICD-10-CM | POA: Insufficient documentation

## 2014-09-01 DIAGNOSIS — R079 Chest pain, unspecified: Secondary | ICD-10-CM

## 2014-09-01 DIAGNOSIS — F10120 Alcohol abuse with intoxication, uncomplicated: Secondary | ICD-10-CM | POA: Diagnosis not present

## 2014-09-01 DIAGNOSIS — S29001A Unspecified injury of muscle and tendon of front wall of thorax, initial encounter: Secondary | ICD-10-CM | POA: Insufficient documentation

## 2014-09-01 DIAGNOSIS — Z720 Tobacco use: Secondary | ICD-10-CM | POA: Insufficient documentation

## 2014-09-01 DIAGNOSIS — N400 Enlarged prostate without lower urinary tract symptoms: Secondary | ICD-10-CM | POA: Diagnosis not present

## 2014-09-01 DIAGNOSIS — Z79899 Other long term (current) drug therapy: Secondary | ICD-10-CM | POA: Diagnosis not present

## 2014-09-01 DIAGNOSIS — Z7982 Long term (current) use of aspirin: Secondary | ICD-10-CM | POA: Insufficient documentation

## 2014-09-01 DIAGNOSIS — Y998 Other external cause status: Secondary | ICD-10-CM | POA: Insufficient documentation

## 2014-09-01 DIAGNOSIS — Z8546 Personal history of malignant neoplasm of prostate: Secondary | ICD-10-CM | POA: Diagnosis not present

## 2014-09-01 DIAGNOSIS — F1010 Alcohol abuse, uncomplicated: Secondary | ICD-10-CM

## 2014-09-01 DIAGNOSIS — Z87442 Personal history of urinary calculi: Secondary | ICD-10-CM | POA: Diagnosis not present

## 2014-09-01 DIAGNOSIS — Y9389 Activity, other specified: Secondary | ICD-10-CM | POA: Diagnosis not present

## 2014-09-01 LAB — COMPREHENSIVE METABOLIC PANEL
ALT: 37 U/L (ref 0–53)
AST: 51 U/L — ABNORMAL HIGH (ref 0–37)
Albumin: 4.1 g/dL (ref 3.5–5.2)
Alkaline Phosphatase: 40 U/L (ref 39–117)
Anion gap: 12 (ref 5–15)
BUN: 17 mg/dL (ref 6–23)
CALCIUM: 8.9 mg/dL (ref 8.4–10.5)
CHLORIDE: 108 mmol/L (ref 96–112)
CO2: 22 mmol/L (ref 19–32)
CREATININE: 1.52 mg/dL — AB (ref 0.50–1.35)
GFR calc Af Amer: 52 mL/min — ABNORMAL LOW (ref >90)
GFR, EST NON AFRICAN AMERICAN: 45 mL/min — AB (ref >90)
Glucose, Bld: 103 mg/dL — ABNORMAL HIGH (ref 70–99)
POTASSIUM: 3.8 mmol/L (ref 3.5–5.1)
SODIUM: 142 mmol/L (ref 135–145)
Total Bilirubin: 0.4 mg/dL (ref 0.3–1.2)
Total Protein: 7.5 g/dL (ref 6.0–8.3)

## 2014-09-01 LAB — TROPONIN I: Troponin I: <0.03 ng/mL (ref <0.031)

## 2014-09-01 LAB — CBC
HCT: 39.5 % (ref 39.0–52.0)
Hemoglobin: 13.1 g/dL (ref 13.0–17.0)
MCH: 32.8 pg (ref 26.0–34.0)
MCHC: 33.2 g/dL (ref 30.0–36.0)
MCV: 99 fL (ref 78.0–100.0)
Platelets: 205 10*3/uL (ref 150–400)
RBC: 3.99 MIL/uL — AB (ref 4.22–5.81)
RDW: 14.3 % (ref 11.5–15.5)
WBC: 4.7 10*3/uL (ref 4.0–10.5)

## 2014-09-01 LAB — ETHANOL: ALCOHOL ETHYL (B): 287 mg/dL — AB (ref 0–9)

## 2014-09-01 LAB — I-STAT TROPONIN, ED: TROPONIN I, POC: 0.01 ng/mL (ref 0.00–0.08)
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
## 2014-09-01 NOTE — ED Notes (Signed)
Patient transported to X-ray 

## 2014-09-02 NOTE — Discharge Instructions (Signed)
Alcohol Intoxication °Alcohol intoxication occurs when the amount of alcohol that a person has consumed impairs his or her ability to mentally and physically function. Alcohol directly impairs the normal chemical activity of the brain. Drinking large amounts of alcohol can lead to changes in mental function and behavior, and it can cause many physical effects that can be harmful.  °Alcohol intoxication can range in severity from mild to very severe. Various factors can affect the level of intoxication that occurs, such as the person's age, gender, weight, frequency of alcohol consumption, and the presence of other medical conditions (such as diabetes, seizures, or heart conditions). Dangerous levels of alcohol intoxication may occur when people drink large amounts of alcohol in a short period (binge drinking). Alcohol can also be especially dangerous when combined with certain prescription medicines or "recreational" drugs. °SIGNS AND SYMPTOMS °Some common signs and symptoms of mild alcohol intoxication include: °· Loss of coordination. °· Changes in mood and behavior. °· Impaired judgment. °· Slurred speech. °As alcohol intoxication progresses to more severe levels, other signs and symptoms will appear. These may include: °· Vomiting. °· Confusion and impaired memory. °· Slowed breathing. °· Seizures. °· Loss of consciousness. °DIAGNOSIS  °Your health care provider will take a medical history and perform a physical exam. You will be asked about the amount and type of alcohol you have consumed. Blood tests will be done to measure the concentration of alcohol in your blood. In many places, your blood alcohol level must be lower than 80 mg/dL (0.08%) to legally drive. However, many dangerous effects of alcohol can occur at much lower levels.  °TREATMENT  °People with alcohol intoxication often do not require treatment. Most of the effects of alcohol intoxication are temporary, and they go away as the alcohol naturally  leaves the body. Your health care provider will monitor your condition until you are stable enough to go home. Fluids are sometimes given through an IV access tube to help prevent dehydration.  °HOME CARE INSTRUCTIONS °· Do not drive after drinking alcohol. °· Stay hydrated. Drink enough water and fluids to keep your urine clear or pale yellow. Avoid caffeine.   °· Only take over-the-counter or prescription medicines as directed by your health care provider.   °SEEK MEDICAL CARE IF:  °· You have persistent vomiting.   °· You do not feel better after a few days. °· You have frequent alcohol intoxication. Your health care provider can help determine if you should see a substance use treatment counselor. °SEEK IMMEDIATE MEDICAL CARE IF:  °· You become shaky or tremble when you try to stop drinking.   °· You shake uncontrollably (seizure).   °· You throw up (vomit) blood. This may be bright red or may look like black coffee grounds.   °· You have blood in your stool. This may be bright red or may appear as a black, tarry, bad smelling stool.   °· You become lightheaded or faint.   °MAKE SURE YOU:  °· Understand these instructions. °· Will watch your condition. °· Will get help right away if you are not doing well or get worse. °Document Released: 03/13/2005 Document Revised: 02/03/2013 Document Reviewed: 11/06/2012 °[employer]® Patient Information ©2015 [employer]. This information is not intended to replace advice given to you by your health care provider. Make sure you discuss any questions you have with your health care provider. ° °Chest Wall Pain °Chest wall pain is pain in or around the bones and muscles of your chest. It may take up to 6 weeks to   get better. It may take longer if you must stay physically active in your work and activities.  °CAUSES  °Chest wall pain may happen on its own. However, it may be caused by: °· A viral illness like the  flu. °· Injury. °· Coughing. °· Exercise. °· Arthritis. °· Fibromyalgia. °· Shingles. °HOME CARE INSTRUCTIONS  °· Avoid overtiring physical activity. Try not to strain or perform activities that cause pain. This includes any activities using your chest or your abdominal and side muscles, especially if heavy weights are used. °· Put ice on the sore area. °¨ Put ice in a plastic bag. °¨ Place a towel between your skin and the bag. °¨ Leave the ice on for 15-20 minutes per hour while awake for the first 2 days. °· Only take over-the-counter or prescription medicines for pain, discomfort, or fever as directed by your caregiver. °SEEK IMMEDIATE MEDICAL CARE IF:  °· Your pain increases, or you are very uncomfortable. °· You have a fever. °· Your chest pain becomes worse. °· You have new, unexplained symptoms. °· You have nausea or vomiting. °· You feel sweaty or lightheaded. °· You have a cough with phlegm (sputum), or you cough up blood. °MAKE SURE YOU:  °· Understand these instructions. °· Will watch your condition. °· Will get help right away if you are not doing well or get worse. °Document Released: 06/03/2005 Document Revised: 08/26/2011 Document Reviewed: 01/28/2011 °ExitCare® Patient Information ©2015 [employer]. This information is not intended to replace advice given to you by your health care provider. Make sure you discuss any questions you have with your health care provider. ° °

## 2015-01-25 ENCOUNTER — Emergency Department (HOSPITAL_COMMUNITY): Payer: Medicare PPO

## 2015-01-25 ENCOUNTER — Encounter (HOSPITAL_COMMUNITY): Payer: Self-pay | Admitting: Emergency Medicine

## 2015-01-25 ENCOUNTER — Inpatient Hospital Stay (HOSPITAL_COMMUNITY)
Admission: EM | Admit: 2015-01-25 | Discharge: 2015-01-29 | DRG: 305 | Disposition: A | Discharge status: Home / Self Care | Payer: Medicare PPO | Attending: Internal Medicine | Admitting: Internal Medicine

## 2015-01-25 DIAGNOSIS — R0789 Other chest pain: Secondary | ICD-10-CM | POA: Diagnosis present

## 2015-01-25 DIAGNOSIS — R079 Chest pain, unspecified: Secondary | ICD-10-CM | POA: Diagnosis present

## 2015-01-25 DIAGNOSIS — L309 Dermatitis, unspecified: Secondary | ICD-10-CM | POA: Diagnosis not present

## 2015-01-25 DIAGNOSIS — R946 Abnormal results of thyroid function studies: Secondary | ICD-10-CM | POA: Diagnosis present

## 2015-01-25 DIAGNOSIS — Z8546 Personal history of malignant neoplasm of prostate: Secondary | ICD-10-CM | POA: Diagnosis not present

## 2015-01-25 DIAGNOSIS — Z79899 Other long term (current) drug therapy: Secondary | ICD-10-CM | POA: Diagnosis not present

## 2015-01-25 DIAGNOSIS — Z87442 Personal history of urinary calculi: Secondary | ICD-10-CM

## 2015-01-25 DIAGNOSIS — Z9119 Patient's noncompliance with other medical treatment and regimen: Secondary | ICD-10-CM | POA: Diagnosis not present

## 2015-01-25 DIAGNOSIS — Z720 Tobacco use: Secondary | ICD-10-CM | POA: Diagnosis not present

## 2015-01-25 DIAGNOSIS — R9431 Abnormal electrocardiogram [ECG] [EKG]: Secondary | ICD-10-CM | POA: Diagnosis present

## 2015-01-25 DIAGNOSIS — R509 Fever, unspecified: Secondary | ICD-10-CM | POA: Diagnosis not present

## 2015-01-25 DIAGNOSIS — N400 Enlarged prostate without lower urinary tract symptoms: Secondary | ICD-10-CM | POA: Diagnosis present

## 2015-01-25 DIAGNOSIS — I10 Essential (primary) hypertension: Principal | ICD-10-CM | POA: Diagnosis present

## 2015-01-25 DIAGNOSIS — N179 Acute kidney failure, unspecified: Secondary | ICD-10-CM | POA: Insufficient documentation

## 2015-01-25 DIAGNOSIS — F1010 Alcohol abuse, uncomplicated: Secondary | ICD-10-CM | POA: Diagnosis present

## 2015-01-25 DIAGNOSIS — R21 Rash and other nonspecific skin eruption: Secondary | ICD-10-CM | POA: Diagnosis not present

## 2015-01-25 DIAGNOSIS — Z91199 Patient's noncompliance with other medical treatment and regimen due to unspecified reason: Secondary | ICD-10-CM | POA: Insufficient documentation

## 2015-01-25 DIAGNOSIS — I160 Hypertensive urgency: Secondary | ICD-10-CM

## 2015-01-25 DIAGNOSIS — F17210 Nicotine dependence, cigarettes, uncomplicated: Secondary | ICD-10-CM | POA: Diagnosis present

## 2015-01-25 DIAGNOSIS — Z9114 Patient's other noncompliance with medication regimen: Secondary | ICD-10-CM | POA: Diagnosis present

## 2015-01-25 LAB — COMPREHENSIVE METABOLIC PANEL
ALT: 11 U/L — ABNORMAL LOW (ref 17–63)
AST: 19 U/L (ref 15–41)
Albumin: 4.2 g/dL (ref 3.5–5.0)
Alkaline Phosphatase: 41 U/L (ref 38–126)
Anion gap: 11 (ref 5–15)
BUN: 12 mg/dL (ref 6–20)
CO2: 23 mmol/L (ref 22–32)
CREATININE: 1.11 mg/dL (ref 0.61–1.24)
Calcium: 9.3 mg/dL (ref 8.9–10.3)
Chloride: 103 mmol/L (ref 101–111)
GLUCOSE: 107 mg/dL — AB (ref 65–99)
Potassium: 3.8 mmol/L (ref 3.5–5.1)
Sodium: 137 mmol/L (ref 135–145)
Total Bilirubin: 0.7 mg/dL (ref 0.3–1.2)
Total Protein: 7.8 g/dL (ref 6.5–8.1)

## 2015-01-25 LAB — CBC
HCT: 41.1 % (ref 39.0–52.0)
Hemoglobin: 14 g/dL (ref 13.0–17.0)
MCH: 32.4 pg (ref 26.0–34.0)
MCHC: 34.1 g/dL (ref 30.0–36.0)
MCV: 95.1 fL (ref 78.0–100.0)
PLATELETS: 197 10*3/uL (ref 150–400)
RBC: 4.32 MIL/uL (ref 4.22–5.81)
RDW: 13.1 % (ref 11.5–15.5)
WBC: 8.8 10*3/uL (ref 4.0–10.5)

## 2015-01-25 LAB — CBC WITH DIFFERENTIAL/PLATELET
BASOS ABS: 0 10*3/uL (ref 0.0–0.1)
BASOS PCT: 0 % (ref 0–1)
Eosinophils Absolute: 0.1 10*3/uL (ref 0.0–0.7)
Eosinophils Relative: 1 % (ref 0–5)
HEMATOCRIT: 41.5 % (ref 39.0–52.0)
Hemoglobin: 13.9 g/dL (ref 13.0–17.0)
Lymphocytes Relative: 19 % (ref 12–46)
Lymphs Abs: 1.4 10*3/uL (ref 0.7–4.0)
MCH: 32.2 pg (ref 26.0–34.0)
MCHC: 33.5 g/dL (ref 30.0–36.0)
MCV: 96.1 fL (ref 78.0–100.0)
Monocytes Absolute: 0.6 10*3/uL (ref 0.1–1.0)
Monocytes Relative: 8 % (ref 3–12)
Neutro Abs: 5.6 10*3/uL (ref 1.7–7.7)
Neutrophils Relative %: 72 % (ref 43–77)
PLATELETS: 201 10*3/uL (ref 150–400)
RBC: 4.32 MIL/uL (ref 4.22–5.81)
RDW: 13 % (ref 11.5–15.5)
WBC: 7.7 10*3/uL (ref 4.0–10.5)

## 2015-01-25 LAB — I-STAT TROPONIN, ED: TROPONIN I, POC: 0 ng/mL (ref 0.00–0.08)

## 2015-01-25 LAB — APTT: aPTT: 29 seconds (ref 24–37)

## 2015-01-25 LAB — PROTIME-INR
INR: 1.05 (ref 0.00–1.49)
Prothrombin Time: 13.9 seconds (ref 11.6–15.2)

## 2015-01-25 LAB — MAGNESIUM: Magnesium: 1.4 mg/dL — ABNORMAL LOW (ref 1.7–2.4)

## 2015-01-25 LAB — CREATININE, SERUM
Creatinine, Ser: 1.12 mg/dL (ref 0.61–1.24)
GFR calc Af Amer: >60 mL/min (ref >60)

## 2015-01-25 LAB — BRAIN NATRIURETIC PEPTIDE: B Natriuretic Peptide: 61.9 pg/mL (ref 0.0–100.0)

## 2015-01-25 LAB — TSH: TSH: 10.286 u[IU]/mL — ABNORMAL HIGH (ref 0.350–4.500)

## 2015-01-25 LAB — PHOSPHORUS: Phosphorus: 2.6 mg/dL (ref 2.5–4.6)

## 2015-01-25 LAB — MRSA PCR SCREENING: MRSA by PCR: NEGATIVE

## 2015-01-25 LAB — TROPONIN I
Troponin I: <0.03 ng/mL (ref <0.031)
Troponin I: <0.03 ng/mL (ref <0.031)

## 2015-01-25 MED ORDER — ONDANSETRON HCL 4 MG/2ML IJ SOLN: 4.0000 mg | Freq: Three times a day (TID) | INTRAMUSCULAR | Status: DC | PRN

## 2015-01-25 MED ORDER — ACETAMINOPHEN 650 MG RE SUPP: 650.0000 mg | Freq: Four times a day (QID) | RECTAL | Status: DC | PRN

## 2015-01-25 MED ORDER — NITROGLYCERIN 0.4 MG SL SUBL
SUBLINGUAL_TABLET | SUBLINGUAL | Status: AC
  Filled 2015-01-25: qty 1

## 2015-01-25 MED ORDER — FENTANYL CITRATE (PF) 100 MCG/2ML IJ SOLN
50.0000 ug | Freq: Once | INTRAMUSCULAR | Status: AC
  Administered 2015-01-25: 50 ug via INTRAVENOUS
  Filled 2015-01-25: qty 2

## 2015-01-25 MED ORDER — ONDANSETRON HCL 4 MG PO TABS: 4.0000 mg | ORAL_TABLET | Freq: Four times a day (QID) | ORAL | Status: DC | PRN

## 2015-01-25 MED ORDER — ACETAMINOPHEN 325 MG PO TABS
650.0000 mg | ORAL_TABLET | Freq: Four times a day (QID) | ORAL | Status: DC | PRN
  Administered 2015-01-26 – 2015-01-28 (×5): 650 mg via ORAL
  Filled 2015-01-25 (×6): qty 2

## 2015-01-25 MED ORDER — ONDANSETRON HCL 4 MG/2ML IJ SOLN
4.0000 mg | Freq: Once | INTRAMUSCULAR | Status: AC
  Administered 2015-01-25: 4 mg via INTRAVENOUS
  Filled 2015-01-25: qty 2

## 2015-01-25 MED ORDER — SODIUM CHLORIDE 0.9 % IV SOLN: INTRAVENOUS | Status: DC

## 2015-01-25 MED ORDER — NITROGLYCERIN 0.4 MG SL SUBL
0.4000 mg | SUBLINGUAL_TABLET | SUBLINGUAL | Status: DC | PRN
  Administered 2015-01-25 – 2015-01-28 (×4): 0.4 mg via SUBLINGUAL
  Filled 2015-01-25 (×2): qty 1

## 2015-01-25 MED ORDER — LABETALOL HCL 5 MG/ML IV SOLN
0.5000 mg/min | INTRAVENOUS | Status: DC
  Administered 2015-01-25: 0.5 mg/min via INTRAVENOUS
  Administered 2015-01-25: 3 mg/min via INTRAVENOUS
  Administered 2015-01-26: 2 mg/min via INTRAVENOUS
  Filled 2015-01-25 (×3): qty 100

## 2015-01-25 MED ORDER — LABETALOL HCL 5 MG/ML IV SOLN
10.0000 mg | Freq: Once | INTRAVENOUS | Status: AC
  Administered 2015-01-25: 10 mg via INTRAVENOUS
  Filled 2015-01-25: qty 4

## 2015-01-25 MED ORDER — SODIUM CHLORIDE 0.9 % IJ SOLN
3.0000 mL | Freq: Two times a day (BID) | INTRAMUSCULAR | Status: DC
  Administered 2015-01-25 – 2015-01-28 (×4): 3 mL via INTRAVENOUS

## 2015-01-25 MED ORDER — ONDANSETRON HCL 4 MG/2ML IJ SOLN
4.0000 mg | Freq: Four times a day (QID) | INTRAMUSCULAR | Status: DC | PRN
  Administered 2015-01-29: 4 mg via INTRAVENOUS
  Filled 2015-01-25: qty 2

## 2015-01-25 MED ORDER — ONDANSETRON HCL 4 MG/2ML IJ SOLN
INTRAMUSCULAR | Status: AC
  Filled 2015-01-25: qty 2

## 2015-01-25 MED ORDER — LABETALOL HCL 5 MG/ML IV SOLN
10.0000 mg | INTRAVENOUS | Status: DC | PRN
  Administered 2015-01-27 – 2015-01-29 (×5): 10 mg via INTRAVENOUS
  Filled 2015-01-25 (×8): qty 4

## 2015-01-25 MED ORDER — SODIUM CHLORIDE 0.9 % IJ SOLN: 3.0000 mL | INTRAMUSCULAR | Status: DC | PRN

## 2015-01-25 MED ORDER — IOHEXOL 350 MG/ML SOLN
100.0000 mL | Freq: Once | INTRAVENOUS | Status: AC | PRN
  Administered 2015-01-25: 100 mL via INTRAVENOUS

## 2015-01-25 MED ORDER — SODIUM CHLORIDE 0.9 % IV SOLN: 250.0000 mL | INTRAVENOUS | Status: DC | PRN

## 2015-01-25 MED ORDER — HYDROMORPHONE HCL 1 MG/ML IJ SOLN
1.0000 mg | INTRAMUSCULAR | Status: AC | PRN
  Administered 2015-01-25: 1 mg via INTRAVENOUS
  Filled 2015-01-25: qty 1

## 2015-01-25 MED ORDER — ENOXAPARIN SODIUM 40 MG/0.4ML ~~LOC~~ SOLN
40.0000 mg | SUBCUTANEOUS | Status: DC
  Administered 2015-01-25 – 2015-01-28 (×4): 40 mg via SUBCUTANEOUS
  Filled 2015-01-25 (×5): qty 0.4

## 2015-01-25 MED ORDER — SODIUM CHLORIDE 0.9 % IV BOLUS (SEPSIS)
1000.0000 mL | Freq: Once | INTRAVENOUS | Status: AC
  Administered 2015-01-25: 1000 mL via INTRAVENOUS

## 2015-01-25 MED ORDER — ASPIRIN 81 MG PO CHEW
324.0000 mg | CHEWABLE_TABLET | Freq: Once | ORAL | Status: AC
  Administered 2015-01-25: 324 mg via ORAL
  Filled 2015-01-25: qty 4

## 2015-01-25 MED ORDER — METOPROLOL TARTRATE 50 MG PO TABS
50.0000 mg | ORAL_TABLET | Freq: Two times a day (BID) | ORAL | Status: DC
  Administered 2015-01-25 – 2015-01-29 (×8): 50 mg via ORAL
  Filled 2015-01-25 (×2): qty 2
  Filled 2015-01-25 (×6): qty 1

## 2015-01-25 MED ORDER — MORPHINE SULFATE 2 MG/ML IJ SOLN
2.0000 mg | INTRAMUSCULAR | Status: DC | PRN
  Administered 2015-01-27 – 2015-01-28 (×2): 2 mg via INTRAVENOUS
  Filled 2015-01-25 (×2): qty 1
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
## 2015-01-25 NOTE — ED Notes (Signed)
UNABLE TO DRAW LABS RN MADE AWARE
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
## 2015-01-25 NOTE — ED Notes (Signed)
Pt c/o throbbing pain in right side of chest that has gotten worse over the past week.  Pt states that pain radiates to right shoulder blade.  Pt has PMH HTN but is out of his BP medications for 3 months.

## 2015-01-25 NOTE — ED Notes (Signed)
Arbie Cookey Agnes(daughter) [phone]

## 2015-01-25 NOTE — ED Notes (Signed)
Patient transported to CT 

## 2015-01-26 ENCOUNTER — Inpatient Hospital Stay (HOSPITAL_COMMUNITY): Payer: Medicare PPO

## 2015-01-26 ENCOUNTER — Encounter (HOSPITAL_COMMUNITY): Payer: Self-pay | Admitting: Cardiology

## 2015-01-26 ENCOUNTER — Other Ambulatory Visit: Payer: Self-pay | Admitting: Cardiology

## 2015-01-26 DIAGNOSIS — N179 Acute kidney failure, unspecified: Secondary | ICD-10-CM

## 2015-01-26 DIAGNOSIS — Z91199 Patient's noncompliance with other medical treatment and regimen due to unspecified reason: Secondary | ICD-10-CM | POA: Insufficient documentation

## 2015-01-26 DIAGNOSIS — Z9119 Patient's noncompliance with other medical treatment and regimen: Secondary | ICD-10-CM | POA: Insufficient documentation

## 2015-01-26 DIAGNOSIS — R079 Chest pain, unspecified: Secondary | ICD-10-CM

## 2015-01-26 DIAGNOSIS — I10 Essential (primary) hypertension: Secondary | ICD-10-CM

## 2015-01-26 LAB — CBC
HEMATOCRIT: 35.6 % — AB (ref 39.0–52.0)
HEMOGLOBIN: 11.6 g/dL — AB (ref 13.0–17.0)
MCH: 31 pg (ref 26.0–34.0)
MCHC: 32.6 g/dL (ref 30.0–36.0)
MCV: 95.2 fL (ref 78.0–100.0)
PLATELETS: 180 10*3/uL (ref 150–400)
RBC: 3.74 MIL/uL — AB (ref 4.22–5.81)
RDW: 13.2 % (ref 11.5–15.5)
WBC: 8.2 10*3/uL (ref 4.0–10.5)

## 2015-01-26 LAB — LIPID PANEL
CHOLESTEROL: 163 mg/dL (ref 0–200)
HDL: 35 mg/dL — ABNORMAL LOW (ref >40)
LDL Cholesterol: 103 mg/dL — ABNORMAL HIGH (ref 0–99)
TRIGLYCERIDES: 127 mg/dL (ref <150)
Total CHOL/HDL Ratio: 4.7 RATIO
VLDL: 25 mg/dL (ref 0–40)

## 2015-01-26 LAB — BASIC METABOLIC PANEL
Anion gap: 10 (ref 5–15)
BUN: 14 mg/dL (ref 6–20)
CALCIUM: 8.6 mg/dL — AB (ref 8.9–10.3)
CO2: 24 mmol/L (ref 22–32)
Chloride: 100 mmol/L — ABNORMAL LOW (ref 101–111)
Creatinine, Ser: 1.61 mg/dL — ABNORMAL HIGH (ref 0.61–1.24)
GFR calc Af Amer: 48 mL/min — ABNORMAL LOW (ref >60)
GFR calc non Af Amer: 42 mL/min — ABNORMAL LOW (ref >60)
GLUCOSE: 144 mg/dL — AB (ref 65–99)
Potassium: 3.6 mmol/L (ref 3.5–5.1)
Sodium: 134 mmol/L — ABNORMAL LOW (ref 135–145)

## 2015-01-26 LAB — T4, FREE: Free T4: 0.73 ng/dL (ref 0.61–1.12)

## 2015-01-26 LAB — TROPONIN I
Troponin I: <0.03 ng/mL (ref <0.031)
Troponin I: <0.03 ng/mL (ref <0.031)

## 2015-01-26 MED ORDER — SODIUM CHLORIDE 0.45 % IV SOLN
INTRAVENOUS | Status: DC
  Administered 2015-01-26 – 2015-01-27 (×3): via INTRAVENOUS

## 2015-01-26 MED ORDER — DIPHENHYDRAMINE HCL 25 MG PO CAPS
25.0000 mg | ORAL_CAPSULE | Freq: Four times a day (QID) | ORAL | Status: DC | PRN
  Administered 2015-01-26 – 2015-01-29 (×4): 25 mg via ORAL
  Filled 2015-01-26 (×4): qty 1
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
## 2015-01-26 NOTE — Progress Notes (Signed)
Echocardiogram 2D Echocardiogram has been performed.  Ricardo Johnson 01/26/2015, 1:19 PM
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
## 2015-01-26 NOTE — Care Management Note (Signed)
Case Management Note  Patient Details  Name: Ricardo Johnson MRN: [id number] Date of Birth: 1945-09-10  Subjective/Objective:       hypertensive crisis with chest pain             Action/Plan:Date:  January 26, 2015 U.R. performed for needs and level of care. Will continue to follow for Case Management needs.  Velva Harman, RN, BSN, [place]   [phone]   Expected Discharge Date:   (unknown)               Expected Discharge Plan:  Home/Self Care  In-House Referral:  NA  Discharge planning Services  CM Consult  Post Acute Care Choice:  NA Choice offered to:  NA  DME Arranged:  N/A DME Agency:  NA  HH Arranged:  NA HH Agency:  NA  Status of Service:  In process, will continue to follow  Medicare Important Message Given:    Date Medicare IM Given:    Medicare IM give by:    Date Additional Medicare IM Given:    Additional Medicare Important Message give by:     If discussed at [place] of Stay Meetings, dates discussed:    Additional Comments:  Leeroy Cha, RN 01/26/2015, 10:27 AM

## 2015-01-27 ENCOUNTER — Inpatient Hospital Stay (HOSPITAL_COMMUNITY): Payer: Medicare PPO

## 2015-01-27 LAB — HEMOGLOBIN A1C
Hgb A1c MFr Bld: 6.1 % — ABNORMAL HIGH (ref 4.8–5.6)
MEAN PLASMA GLUCOSE: 128 mg/dL

## 2015-01-27 LAB — CBC
HCT: 35.5 % — ABNORMAL LOW (ref 39.0–52.0)
HEMOGLOBIN: 12.2 g/dL — AB (ref 13.0–17.0)
MCH: 32.8 pg (ref 26.0–34.0)
MCHC: 34.4 g/dL (ref 30.0–36.0)
MCV: 95.4 fL (ref 78.0–100.0)
Platelets: 182 10*3/uL (ref 150–400)
RBC: 3.72 MIL/uL — AB (ref 4.22–5.81)
RDW: 13.1 % (ref 11.5–15.5)
WBC: 7.7 10*3/uL (ref 4.0–10.5)

## 2015-01-27 LAB — BASIC METABOLIC PANEL
Anion gap: 9 (ref 5–15)
BUN: 15 mg/dL (ref 6–20)
CO2: 25 mmol/L (ref 22–32)
Calcium: 8.7 mg/dL — ABNORMAL LOW (ref 8.9–10.3)
Chloride: 103 mmol/L (ref 101–111)
Creatinine, Ser: 1.31 mg/dL — ABNORMAL HIGH (ref 0.61–1.24)
GFR calc Af Amer: >60 mL/min (ref >60)
GFR calc non Af Amer: 54 mL/min — ABNORMAL LOW (ref >60)
Glucose, Bld: 119 mg/dL — ABNORMAL HIGH (ref 65–99)
POTASSIUM: 3.6 mmol/L (ref 3.5–5.1)
SODIUM: 137 mmol/L (ref 135–145)

## 2015-01-27 LAB — URINE MICROSCOPIC-ADD ON

## 2015-01-27 LAB — URINALYSIS, ROUTINE W REFLEX MICROSCOPIC
Bilirubin Urine: NEGATIVE
GLUCOSE, UA: NEGATIVE mg/dL
Hgb urine dipstick: NEGATIVE
KETONES UR: NEGATIVE mg/dL
LEUKOCYTES UA: NEGATIVE
NITRITE: NEGATIVE
PH: 5.5 (ref 5.0–8.0)
Protein, ur: 30 mg/dL — AB
Specific Gravity, Urine: 1.018 (ref 1.005–1.030)
UROBILINOGEN UA: 1 mg/dL (ref 0.0–1.0)

## 2015-01-27 LAB — LACTIC ACID, PLASMA: Lactic Acid, Venous: 1.1 mmol/L (ref 0.5–2.0)

## 2015-01-27 LAB — MAGNESIUM: MAGNESIUM: 1.5 mg/dL — AB (ref 1.7–2.4)

## 2015-01-27 MED ORDER — MAGNESIUM SULFATE 2 GM/50ML IV SOLN
2.0000 g | Freq: Once | INTRAVENOUS | Status: AC
  Administered 2015-01-27: 2 g via INTRAVENOUS
  Filled 2015-01-27: qty 50

## 2015-01-27 NOTE — Care Management Important Message (Signed)
Important Message  Patient Details  Name: Ricardo Johnson MRN: [id number] Date of Birth: 1945/11/25   Medicare Important Message Given:  [facility] notification given    Camillo Flaming 01/27/2015, 10:29 AMImportant Message  Patient Details  Name: Ricardo Johnson MRN: [id number] Date of Birth: Aug 12, 1945   Medicare Important Message Given:  Yes-second notification given    Camillo Flaming 01/27/2015, 10:28 AM
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
## 2015-01-28 LAB — CBC
HCT: 36.3 % — ABNORMAL LOW (ref 39.0–52.0)
Hemoglobin: 12 g/dL — ABNORMAL LOW (ref 13.0–17.0)
MCH: 31.6 pg (ref 26.0–34.0)
MCHC: 33.1 g/dL (ref 30.0–36.0)
MCV: 95.5 fL (ref 78.0–100.0)
Platelets: 189 10*3/uL (ref 150–400)
RBC: 3.8 MIL/uL — AB (ref 4.22–5.81)
RDW: 13 % (ref 11.5–15.5)
WBC: 5.4 10*3/uL (ref 4.0–10.5)

## 2015-01-28 LAB — BASIC METABOLIC PANEL
ANION GAP: 8 (ref 5–15)
BUN: 11 mg/dL (ref 6–20)
CALCIUM: 9 mg/dL (ref 8.9–10.3)
CO2: 27 mmol/L (ref 22–32)
CREATININE: 1.25 mg/dL — AB (ref 0.61–1.24)
Chloride: 102 mmol/L (ref 101–111)
GFR, EST NON AFRICAN AMERICAN: 57 mL/min — AB (ref >60)
GLUCOSE: 113 mg/dL — AB (ref 65–99)
Potassium: 3.5 mmol/L (ref 3.5–5.1)
Sodium: 137 mmol/L (ref 135–145)

## 2015-01-28 LAB — TROPONIN I
Troponin I: <0.03 ng/mL (ref <0.031)
Troponin I: <0.03 ng/mL (ref <0.031)

## 2015-01-28 LAB — MAGNESIUM: Magnesium: 1.7 mg/dL (ref 1.7–2.4)

## 2015-01-28 MED ORDER — MAGNESIUM SULFATE 2 GM/50ML IV SOLN
2.0000 g | Freq: Once | INTRAVENOUS | Status: AC
  Administered 2015-01-28: 2 g via INTRAVENOUS
  Filled 2015-01-28: qty 50

## 2015-01-28 MED ORDER — FUROSEMIDE 10 MG/ML IJ SOLN
20.0000 mg | Freq: Once | INTRAMUSCULAR | Status: AC
  Administered 2015-01-28: 20 mg via INTRAVENOUS
  Filled 2015-01-28: qty 2
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
## 2015-01-28 NOTE — Progress Notes (Signed)
Co? CP 7/10 prn's given Ricardo Johnson) MD paged new orders received.now rates CP 5/10.will follow

## 2015-01-28 NOTE — Progress Notes (Signed)
BP rechecked improved after med-see VSs flow section. Will cont to monitor. SRP, RN
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
## 2015-01-28 NOTE — Progress Notes (Signed)
Pt with increased BP 204/94--MD notified--Labetolol given as ordered. Denies chest pain and discomfort, pt sitting in chair talking with family. SRP, RN

## 2015-01-29 LAB — BASIC METABOLIC PANEL
Anion gap: 8 (ref 5–15)
BUN: 16 mg/dL (ref 6–20)
CO2: 27 mmol/L (ref 22–32)
CREATININE: 1.29 mg/dL — AB (ref 0.61–1.24)
Calcium: 9.1 mg/dL (ref 8.9–10.3)
Chloride: 103 mmol/L (ref 101–111)
GFR calc Af Amer: >60 mL/min (ref >60)
GFR calc non Af Amer: 55 mL/min — ABNORMAL LOW (ref >60)
Glucose, Bld: 120 mg/dL — ABNORMAL HIGH (ref 65–99)
Potassium: 3.8 mmol/L (ref 3.5–5.1)
SODIUM: 138 mmol/L (ref 135–145)

## 2015-01-29 LAB — MAGNESIUM: Magnesium: 1.8 mg/dL (ref 1.7–2.4)

## 2015-01-29 MED ORDER — LISINOPRIL 5 MG PO TABS: 5.0000 mg | ORAL_TABLET | Freq: Every day | ORAL | Status: DC

## 2015-01-29 MED ORDER — METOPROLOL TARTRATE 50 MG PO TABS: 50.0000 mg | ORAL_TABLET | Freq: Two times a day (BID) | ORAL | Status: DC

## 2015-01-29 MED ORDER — LISINOPRIL 5 MG PO TABS
5.0000 mg | ORAL_TABLET | Freq: Every day | ORAL | Status: DC
  Administered 2015-01-29: 5 mg via ORAL
  Filled 2015-01-29: qty 1

## 2015-01-29 NOTE — Discharge Instructions (Signed)

## 2015-01-29 NOTE — Progress Notes (Signed)
Waiting for ride 

## 2015-01-29 NOTE — Progress Notes (Signed)
D/C'd w/ dtr via w/c.voices no c/o

## 2015-02-01 ENCOUNTER — Telehealth (HOSPITAL_COMMUNITY): Payer: Self-pay

## 2015-02-01 LAB — CULTURE, BLOOD (ROUTINE X 2)
CULTURE: NO GROWTH
CULTURE: NO GROWTH

## 2015-02-01 NOTE — Telephone Encounter (Signed)
Patient gave permission for me to speak with his daughter, Arbie Cookey. I called his daughter(Carol) and gave her detailed instructions per Myocardial Perfusion Study Information Sheet for test on 02-06-2015 at 1145. Patient's daugther Notified to arrive 15 minutes early, and that it is imperative to arrive on time for appointment to keep from having the test rescheduled. Patient's daughter verbalized understanding. Oletta Lamas, Haya Hemler A

## 2015-02-01 NOTE — Telephone Encounter (Signed)
I will notify Ebony. Pt daughter states that she took pt to see his primary physician yesterday and primary physician also requested a cardiac stress test. Pt daughter advised to reschedule appointment and call pt's primary care physician to report that one has already been scheduled.

## 2015-02-03 ENCOUNTER — Inpatient Hospital Stay (HOSPITAL_COMMUNITY): Admit: 2015-02-03 | Payer: Medicare PPO

## 2015-02-06 ENCOUNTER — Ambulatory Visit (HOSPITAL_COMMUNITY): Payer: Medicare PPO | Attending: Internal Medicine

## 2015-02-06 DIAGNOSIS — F17200 Nicotine dependence, unspecified, uncomplicated: Secondary | ICD-10-CM | POA: Insufficient documentation

## 2015-02-06 DIAGNOSIS — I10 Essential (primary) hypertension: Secondary | ICD-10-CM

## 2015-02-06 DIAGNOSIS — R079 Chest pain, unspecified: Secondary | ICD-10-CM | POA: Diagnosis present

## 2015-02-06 LAB — MYOCARDIAL PERFUSION IMAGING
CHL CUP RESTING HR STRESS: 62 {beats}/min
CSEPPHR: 100 {beats}/min
LV dias vol: 92 mL
LVSYSVOL: 38 mL
NUC STRESS TID: 0.99
RATE: 0.22
SDS: 2
SRS: 0
SSS: 2

## 2015-02-06 MED ORDER — TECHNETIUM TC 99M SESTAMIBI GENERIC - CARDIOLITE
30.7000 | Freq: Once | INTRAVENOUS | Status: AC | PRN
  Administered 2015-02-06: 30.7 via INTRAVENOUS

## 2015-02-06 MED ORDER — TECHNETIUM TC 99M SESTAMIBI GENERIC - CARDIOLITE
10.8000 | Freq: Once | INTRAVENOUS | Status: AC | PRN
  Administered 2015-02-06: 11 via INTRAVENOUS

## 2015-02-06 MED ORDER — REGADENOSON 0.4 MG/5ML IV SOLN
0.4000 mg | Freq: Once | INTRAVENOUS | Status: AC
  Administered 2015-02-06: 0.4 mg via INTRAVENOUS

## 2015-02-07 ENCOUNTER — Encounter: Payer: Self-pay | Admitting: *Deleted

## 2015-02-21 ENCOUNTER — Encounter: Payer: Self-pay | Admitting: Cardiovascular Disease

## 2015-02-21 ENCOUNTER — Ambulatory Visit (INDEPENDENT_AMBULATORY_CARE_PROVIDER_SITE_OTHER): Payer: Medicare PPO | Admitting: Cardiovascular Disease

## 2015-02-21 VITALS — BP 170/100 | HR 54 | Ht 66.0 in | Wt 152.0 lb

## 2015-02-21 DIAGNOSIS — Z79899 Other long term (current) drug therapy: Secondary | ICD-10-CM

## 2015-02-21 DIAGNOSIS — F17200 Nicotine dependence, unspecified, uncomplicated: Secondary | ICD-10-CM

## 2015-02-21 DIAGNOSIS — Z720 Tobacco use: Secondary | ICD-10-CM

## 2015-02-21 MED ORDER — AMLODIPINE BESYLATE 5 MG PO TABS: 5.0000 mg | ORAL_TABLET | Freq: Every day | ORAL | Status: DC

## 2015-02-21 MED ORDER — LISINOPRIL 20 MG PO TABS: 20.0000 mg | ORAL_TABLET | Freq: Every day | ORAL | Status: DC

## 2015-02-21 MED ORDER — METOPROLOL SUCCINATE ER 50 MG PO TB24: 50.0000 mg | ORAL_TABLET | Freq: Every day | ORAL | Status: DC

## 2015-02-21 NOTE — Assessment & Plan Note (Signed)
History of accelerated hypertension on lisinopril and metoprolol. His blood pressure today is 170/100. He does admit to medication compliance and salt avoidance. He does not check his blood pressure at home. I'm going to add 5 mg of amlodipine and have him come back to be checked in 6 weeks by a mid-level provider.

## 2015-02-21 NOTE — Patient Instructions (Signed)
Medication Instructions:   Start Amlodipine 5mg  daily.  Take this medication in addition to your current medications.   Labwork:  Your physician recommends that you return for lab work in: 2-3 weeks   Testing/Procedures:  n/a  Follow-Up:  6 weeks with Erasmo Downer, our pharmacist, for blood pressure management 6 months with an extender  1 year with Dr Gwenlyn Found.   Any Other Special Instructions Will Be Listed Below (If Applicable).

## 2015-02-21 NOTE — Assessment & Plan Note (Signed)
Recently discontinued 3 weeks ago

## 2015-02-21 NOTE — Progress Notes (Signed)
02/21/2015 Courtney Paris Barco   [birth year]  [id number]  Primary Physician No primary care provider on file. Primary Cardiologist: Lorretta Harp MD Renae Gloss   HPI:  [name] returns today for post hospital follow-up. He is a 69-year-old thin-appearing widowed African-American male father of 36 is retired from the [employer]. He was admitted in early August last month with chest pain and axillary hypertension. He ruled out for myocardial infarction. A 2-D echo was normal and subsequent outpatient Myoview stress test was normal as well. He does have a history of hypertension and recently discontinued tobacco abuse.   Current Outpatient Prescriptions  Medication Sig Dispense Refill  . baclofen (LIORESAL) 10 MG tablet Take 10 mg by mouth 3 (three) times daily as needed.    . finasteride (PROSCAR) 5 MG tablet TAKE 1 TABLET BY MOUTH EVERY DAY 30 tablet 2  . FLUoxetine (PROZAC) 20 MG capsule Take 20 mg by mouth daily.    Marland Kitchen lisinopril (PRINIVIL,ZESTRIL) 5 MG tablet Take 1 tablet (5 mg total) by mouth daily. (Patient taking differently: Take 20 mg by mouth daily. ) 30 tablet 0  . metoprolol succinate (TOPROL-XL) 50 MG 24 hr tablet Take 50 mg by mouth daily.    . naproxen sodium (ANAPROX) 220 MG tablet Take 220 mg by mouth 2 (two) times daily with a meal.    . omeprazole (PRILOSEC) 20 MG capsule Take 20 mg by mouth daily.    . Tamsulosin HCl (FLOMAX) 0.4 MG CAPS TAKE ONE CAPSULE BY MOUTH EVERY DAY 30 capsule 2   No current facility-administered medications for this visit.    No Known Allergies  Social History   Social History  . Marital Status: Married    Spouse Name: N/A  . Number of Children: N/A  . Years of Education: N/A   Occupational History  . Not on file.   Social History Main Topics  . Smoking status: Current Every Day Smoker  . Smokeless tobacco: Never Used  . Alcohol Use: Yes  . Drug Use: No  . Sexual Activity: Not on file   Other Topics Concern    . Not on file   Social History Narrative   Occupation: Cytogeneticist- Geologist, engineering   Married, 5 children   Regular exercise- yes     Review of Systems: General: negative for chills, fever, night sweats or weight changes.  Cardiovascular: negative for chest pain, dyspnea on exertion, edema, orthopnea, palpitations, paroxysmal nocturnal dyspnea or shortness of breath Dermatological: negative for rash Respiratory: negative for cough or wheezing Urologic: negative for hematuria Abdominal: negative for nausea, vomiting, diarrhea, bright red blood per rectum, melena, or hematemesis Neurologic: negative for visual changes, syncope, or dizziness All other systems reviewed and are otherwise negative except as noted above.    Blood pressure 170/100, pulse 54, height 5\' 6"  (1.676 m), weight 152 lb (68.947 kg).  General appearance: alert and no distress Neck: no adenopathy, no carotid bruit, no JVD, supple, symmetrical, trachea midline and thyroid not enlarged, symmetric, no tenderness/mass/nodules Lungs: clear to auscultation bilaterally Heart: regular rate and rhythm, S1, S2 normal, no murmur, click, rub or gallop Extremities: extremities normal, atraumatic, no cyanosis or edema Pulses: 2+ and symmetric Skin: Skin color, texture, turgor normal. No rashes or lesions Neurologic: Grossly normal  EKG not performed today  ASSESSMENT AND PLAN:   TOBACCO ABUSE Recently discontinued 3 weeks ago  Pain in the chest Mr. Lonia Farber had a recent admission for chest pain and accelerated  hypertension in early August. He had a Myoview stress test performed and after discharge as an outpatient which was entirely normal.  Essential hypertension History of accelerated hypertension on lisinopril and metoprolol. His blood pressure today is 170/100. He does admit to medication compliance and salt avoidance. He does not check his blood pressure at home. I'm going to add 5 mg of amlodipine and have him come  back to be checked in 6 weeks by a mid-level provider.      Lorretta Harp MD FACP,FACC,FAHA, [facility] 02/21/2015 11:03 AM

## 2015-02-21 NOTE — Assessment & Plan Note (Signed)
[name] had a recent admission for chest pain and accelerated hypertension in early August. He had a Myoview stress test performed and after discharge as an outpatient which was entirely normal.

## 2015-04-06 ENCOUNTER — Encounter: Payer: Self-pay | Admitting: Pharmacist Clinician (PhC)/ Clinical Pharmacy Specialist

## 2015-04-06 ENCOUNTER — Ambulatory Visit (INDEPENDENT_AMBULATORY_CARE_PROVIDER_SITE_OTHER): Payer: Medicare PPO | Admitting: Pharmacist Clinician (PhC)/ Clinical Pharmacy Specialist

## 2015-04-06 VITALS — BP 160/90 | HR 68 | Ht 66.0 in | Wt 151.3 lb

## 2015-04-06 DIAGNOSIS — I10 Essential (primary) hypertension: Secondary | ICD-10-CM

## 2015-04-06 NOTE — Patient Instructions (Addendum)
Return for a a follow up appointment in 1 month  Your blood pressure today is 160/90  (goal is to stay less than 150/90)  If you need a refill on your cardiac medications before your next appointment, please call your pharmacy.  Take your BP meds as follows:    AM: lisinopril 20 mg, metoprolol 50 mg, lisinopril 20 mg   Bring all of your meds, your BP cuff and your record of home blood pressures to your next appointment.  Exercise as you're able, try to walk approximately 30 minutes per day.  Keep salt intake to a minimum, especially watch canned and prepared boxed foods.  Eat more fresh fruits and vegetables and fewer canned items.  Avoid eating in fast food restaurants.    HOW TO TAKE YOUR BLOOD PRESSURE: . Rest 5 minutes before taking your blood pressure. .  Don't smoke or drink caffeinated beverages for at least 30 minutes before. . Take your blood pressure before (not after) you eat. . Sit comfortably with your back supported and both feet on the floor (don't cross your legs). . Elevate your arm to heart level on a table or a desk. . Use the proper sized cuff. It should fit smoothly and snugly around your bare upper arm. There should be enough room to slip a fingertip under the cuff. The bottom edge of the cuff should be 1 inch above the crease of the elbow. . Ideally, take 3 measurements at one sitting and record the average.

## 2015-04-06 NOTE — Assessment & Plan Note (Addendum)
Patient had some confusion about medications.  When he was prescribed the amlodipine he then stopped his lisinopril.   Pressure elevated today, so will have patient re-start the lisinopril and it was explained to him clearly and with written instructions that he needs to take all three medications.  Would like to divide them to 2 each morning and 1 each evening, but he currently has no evening medications, and I don't want to risk a decreased compliance by having him take meds twice daily.  I will see him back in a month for follow up.

## 2015-04-06 NOTE — Progress Notes (Signed)
     04/06/2015 Raynold Blankenbaker Cavanaugh 05/17/1946 [id number]   HPI:  Ricardo Johnson is a 69 y.o. male patient of Dr Gwenlyn Found, with a PMH below who presents today for hypertension clinic evaluation.  He was hospitalized in August with a BP of 201/120.  When he saw Dr. Gwenlyn Found he was started on amlodipine 5 mg in addition to his metoprolol succ 50 mg and lisinopril 20 mg.  Unfortunately, he had some confusion about medications, and stopped the lisinopril when he started the amlodipine.    Cardiac Hx: hypertension;  Myoview stress test showed normal (02/2015)  Social Hx: he quit smoking after hospitalization in August of this year, drinks occasional beer or soda, and 1 cup of coffee per day  Exercise: walks 3-4 blocks to store most days of the week  Home BP readings: no home cuff, did take BP at pharmacy recently, thought it was normal  Current antihypertensive medications: amlodipine 5 mg and metoprolol succ 50 mg both qam   Current Outpatient Prescriptions  Medication Sig Dispense Refill  . amLODipine (NORVASC) 5 MG tablet Take 1 tablet (5 mg total) by mouth daily. 30 tablet 11  . baclofen (LIORESAL) 10 MG tablet Take 10 mg by mouth 3 (three) times daily as needed.    . finasteride (PROSCAR) 5 MG tablet TAKE 1 TABLET BY MOUTH EVERY DAY 30 tablet 2  . FLUoxetine (PROZAC) 20 MG capsule Take 20 mg by mouth daily.    Marland Kitchen lisinopril (PRINIVIL,ZESTRIL) 20 MG tablet Take 1 tablet (20 mg total) by mouth daily. 30 tablet 11  . metoprolol succinate (TOPROL-XL) 50 MG 24 hr tablet Take 1 tablet (50 mg total) by mouth daily. 30 tablet 11  . naproxen sodium (ANAPROX) 220 MG tablet Take 220 mg by mouth 2 (two) times daily with a meal.    . omeprazole (PRILOSEC) 20 MG capsule Take 20 mg by mouth daily.    . Tamsulosin HCl (FLOMAX) 0.4 MG CAPS TAKE ONE CAPSULE BY MOUTH EVERY DAY 30 capsule 2   No current facility-administered medications for this visit.    No Known Allergies  Past Medical History  Diagnosis  Date  . Hypertension   . Prostate cancer ([place]) 2005  . BPH (benign prostatic hypertrophy) 2000-2001  . Tobacco abuse   . Alcohol abuse   . History of nephrolithiasis   . Kidney stones     Blood pressure 160/90, pulse 68, height 5\' 6"  (1.676 m), weight 151 lb 4.8 oz (68.629 kg).    Tommy Medal PharmD CPP [facility]

## 2015-05-04 ENCOUNTER — Encounter: Payer: Self-pay | Admitting: Pharmacist Clinician (PhC)/ Clinical Pharmacy Specialist

## 2015-05-04 ENCOUNTER — Ambulatory Visit (INDEPENDENT_AMBULATORY_CARE_PROVIDER_SITE_OTHER): Payer: Medicare PPO | Admitting: Pharmacist Clinician (PhC)/ Clinical Pharmacy Specialist

## 2015-05-04 VITALS — BP 156/92 | HR 80 | Ht 66.0 in | Wt 156.0 lb

## 2015-05-04 DIAGNOSIS — I10 Essential (primary) hypertension: Secondary | ICD-10-CM | POA: Diagnosis not present

## 2015-05-04 MED ORDER — LISINOPRIL 40 MG PO TABS: 40.0000 mg | ORAL_TABLET | Freq: Every day | ORAL | Status: DC

## 2015-05-04 NOTE — Assessment & Plan Note (Signed)
Blood pressure still elevated today at 156/92.  Patient reports no problems with medications, so will increase lisinopril to 40 mg daily.  Patient was asked to go to lab for BMET just after Thanksgiving.  Will see him back in January for follow up.

## 2015-05-04 NOTE — Patient Instructions (Signed)
Return for a a follow up appointment in 2 months - we will call you in December for an appointment  Your blood pressure today is 156/92  Go to the lab the week after Thanksgiving and get your blood drawn  Take your BP meds as follows:increase lisinopril to 40 mg each day; continue all your other medications  Bring all of your meds, your BP cuff and your record of home blood pressures to your next appointment.  Exercise as you're able, try to walk approximately 30 minutes per day.  Keep salt intake to a minimum, especially watch canned and prepared boxed foods.  Eat more fresh fruits and vegetables and fewer canned items.  Avoid eating in fast food restaurants.

## 2015-05-04 NOTE — Progress Notes (Signed)
     05/04/2015 Ricardo Johnson 1945/11/27 [id number]   HPI:  Ricardo Johnson is a 69 y.o. male patient of Dr Gwenlyn Found, with a PMH below who presents today for hypertension clinic follow up.  He was hospitalized in August with a BP of 201/120.  When he saw Dr. Gwenlyn Found he was started on amlodipine 5 mg in addition to his metoprolol succ 50 mg and lisinopril 20 mg.  Unfortunately, he had some confusion about medications, and stopped the lisinopril when he started the amlodipine.  Since I saw him last he has been taking all three medications each morning and states no problems with side effects or compliance.    Cardiac Hx: hypertension;  Myoview stress test showed normal (02/2015)  Social Hx: he quit smoking after hospitalization in August of this year, drinks occasional beer or soda, and 1 cup of coffee most days  Diet:  Eats mostly at home, cooks himself; does eat canned vegetables, but has decreased pork, eating more chicken (wings) and [place]  Exercise: walks 3-4 blocks to store most days of the week  Home BP readings: no home cuff, did take BP at pharmacy recently, thought it was normal  Current antihypertensive medications: amlodipine 5 mg, lisinopril 20 mg and metoprolol succ 50 mg all qam   Current Outpatient Prescriptions  Medication Sig Dispense Refill  . amLODipine (NORVASC) 5 MG tablet Take 1 tablet (5 mg total) by mouth daily. 30 tablet 11  . baclofen (LIORESAL) 10 MG tablet Take 10 mg by mouth 3 (three) times daily as needed.    . finasteride (PROSCAR) 5 MG tablet TAKE 1 TABLET BY MOUTH EVERY DAY 30 tablet 2  . FLUoxetine (PROZAC) 20 MG capsule Take 20 mg by mouth daily.    Marland Kitchen lisinopril (PRINIVIL,ZESTRIL) 40 MG tablet Take 1 tablet (40 mg total) by mouth daily. 90 tablet 2  . metoprolol succinate (TOPROL-XL) 50 MG 24 hr tablet Take 1 tablet (50 mg total) by mouth daily. 30 tablet 11  . naproxen sodium (ANAPROX) 220 MG tablet Take 220 mg by mouth 2 (two) times daily with a meal.      . omeprazole (PRILOSEC) 20 MG capsule Take 20 mg by mouth daily.    . Tamsulosin HCl (FLOMAX) 0.4 MG CAPS TAKE ONE CAPSULE BY MOUTH EVERY DAY 30 capsule 2   No current facility-administered medications for this visit.    No Known Allergies  Past Medical History  Diagnosis Date  . Hypertension   . Prostate cancer ([place]) 2005  . BPH (benign prostatic hypertrophy) 2000-2001  . Tobacco abuse   . Alcohol abuse   . History of nephrolithiasis   . Kidney stones     Blood pressure 156/92, pulse 80, height 5\' 6"  (1.676 m), weight 156 lb (70.761 kg).    Tommy Medal PharmD CPP [facility]

## 2015-07-07 DIAGNOSIS — E784 Other hyperlipidemia: Secondary | ICD-10-CM | POA: Diagnosis not present

## 2015-07-07 DIAGNOSIS — I10 Essential (primary) hypertension: Secondary | ICD-10-CM | POA: Diagnosis not present

## 2015-07-07 DIAGNOSIS — E538 Deficiency of other specified B group vitamins: Secondary | ICD-10-CM | POA: Diagnosis not present

## 2015-07-07 DIAGNOSIS — N289 Disorder of kidney and ureter, unspecified: Secondary | ICD-10-CM | POA: Diagnosis not present

## 2015-07-07 DIAGNOSIS — R300 Dysuria: Secondary | ICD-10-CM | POA: Diagnosis not present

## 2015-07-07 DIAGNOSIS — E039 Hypothyroidism, unspecified: Secondary | ICD-10-CM | POA: Diagnosis not present

## 2015-07-07 DIAGNOSIS — E559 Vitamin D deficiency, unspecified: Secondary | ICD-10-CM | POA: Diagnosis not present

## 2015-07-07 DIAGNOSIS — E7211 Homocystinuria: Secondary | ICD-10-CM | POA: Diagnosis not present

## 2015-07-07 DIAGNOSIS — Z720 Tobacco use: Secondary | ICD-10-CM | POA: Diagnosis not present

## 2015-07-07 DIAGNOSIS — R7301 Impaired fasting glucose: Secondary | ICD-10-CM | POA: Diagnosis not present

## 2015-07-07 DIAGNOSIS — R319 Hematuria, unspecified: Secondary | ICD-10-CM | POA: Diagnosis not present

## 2015-08-14 DIAGNOSIS — E7211 Homocystinuria: Secondary | ICD-10-CM | POA: Diagnosis not present

## 2015-08-14 DIAGNOSIS — I10 Essential (primary) hypertension: Secondary | ICD-10-CM | POA: Diagnosis not present

## 2015-08-24 DIAGNOSIS — E7211 Homocystinuria: Secondary | ICD-10-CM | POA: Diagnosis not present

## 2015-08-24 DIAGNOSIS — I10 Essential (primary) hypertension: Secondary | ICD-10-CM | POA: Diagnosis not present

## 2015-08-25 DIAGNOSIS — E538 Deficiency of other specified B group vitamins: Secondary | ICD-10-CM | POA: Diagnosis not present

## 2015-08-25 DIAGNOSIS — E7211 Homocystinuria: Secondary | ICD-10-CM | POA: Diagnosis not present

## 2015-08-25 DIAGNOSIS — M25571 Pain in right ankle and joints of right foot: Secondary | ICD-10-CM | POA: Diagnosis not present

## 2015-08-25 DIAGNOSIS — M109 Gout, unspecified: Secondary | ICD-10-CM | POA: Diagnosis not present

## 2015-08-25 DIAGNOSIS — N289 Disorder of kidney and ureter, unspecified: Secondary | ICD-10-CM | POA: Diagnosis not present

## 2015-08-25 DIAGNOSIS — I10 Essential (primary) hypertension: Secondary | ICD-10-CM | POA: Diagnosis not present

## 2015-08-25 DIAGNOSIS — E784 Other hyperlipidemia: Secondary | ICD-10-CM | POA: Diagnosis not present

## 2015-08-25 DIAGNOSIS — Z720 Tobacco use: Secondary | ICD-10-CM | POA: Diagnosis not present

## 2015-08-25 DIAGNOSIS — E559 Vitamin D deficiency, unspecified: Secondary | ICD-10-CM | POA: Diagnosis not present

## 2015-10-05 DIAGNOSIS — E7211 Homocystinuria: Secondary | ICD-10-CM | POA: Diagnosis not present

## 2015-10-05 DIAGNOSIS — I10 Essential (primary) hypertension: Secondary | ICD-10-CM | POA: Diagnosis not present

## 2015-10-10 DIAGNOSIS — N289 Disorder of kidney and ureter, unspecified: Secondary | ICD-10-CM | POA: Diagnosis not present

## 2015-10-10 DIAGNOSIS — E039 Hypothyroidism, unspecified: Secondary | ICD-10-CM | POA: Diagnosis not present

## 2015-10-10 DIAGNOSIS — E784 Other hyperlipidemia: Secondary | ICD-10-CM | POA: Diagnosis not present

## 2015-10-10 DIAGNOSIS — E7211 Homocystinuria: Secondary | ICD-10-CM | POA: Diagnosis not present

## 2015-10-10 DIAGNOSIS — Z720 Tobacco use: Secondary | ICD-10-CM | POA: Diagnosis not present

## 2015-10-10 DIAGNOSIS — Z0000 Encounter for general adult medical examination without abnormal findings: Secondary | ICD-10-CM | POA: Diagnosis not present

## 2015-10-10 DIAGNOSIS — E559 Vitamin D deficiency, unspecified: Secondary | ICD-10-CM | POA: Diagnosis not present

## 2015-10-10 DIAGNOSIS — M109 Gout, unspecified: Secondary | ICD-10-CM | POA: Diagnosis not present

## 2015-10-10 DIAGNOSIS — I10 Essential (primary) hypertension: Secondary | ICD-10-CM | POA: Diagnosis not present

## 2015-10-10 DIAGNOSIS — E538 Deficiency of other specified B group vitamins: Secondary | ICD-10-CM | POA: Diagnosis not present

## 2015-10-30 ENCOUNTER — Emergency Department (HOSPITAL_COMMUNITY)
Admission: EM | Admit: 2015-10-30 | Discharge: 2015-10-30 | Disposition: A | Discharge status: Home / Self Care | Payer: Commercial Managed Care - HMO | Attending: Emergency Medicine | Admitting: Emergency Medicine

## 2015-10-30 ENCOUNTER — Encounter (HOSPITAL_COMMUNITY): Payer: Self-pay

## 2015-10-30 DIAGNOSIS — Z791 Long term (current) use of non-steroidal anti-inflammatories (NSAID): Secondary | ICD-10-CM | POA: Insufficient documentation

## 2015-10-30 DIAGNOSIS — I10 Essential (primary) hypertension: Secondary | ICD-10-CM | POA: Diagnosis not present

## 2015-10-30 DIAGNOSIS — R300 Dysuria: Secondary | ICD-10-CM

## 2015-10-30 DIAGNOSIS — Z79899 Other long term (current) drug therapy: Secondary | ICD-10-CM | POA: Diagnosis not present

## 2015-10-30 DIAGNOSIS — Z87891 Personal history of nicotine dependence: Secondary | ICD-10-CM | POA: Insufficient documentation

## 2015-10-30 DIAGNOSIS — R339 Retention of urine, unspecified: Secondary | ICD-10-CM | POA: Diagnosis present

## 2015-10-30 DIAGNOSIS — C61 Malignant neoplasm of prostate: Secondary | ICD-10-CM | POA: Insufficient documentation

## 2015-10-30 LAB — URINALYSIS, ROUTINE W REFLEX MICROSCOPIC
BILIRUBIN URINE: NEGATIVE
GLUCOSE, UA: NEGATIVE mg/dL
Ketones, ur: NEGATIVE mg/dL
Leukocytes, UA: NEGATIVE
Nitrite: NEGATIVE
PH: 5 (ref 5.0–8.0)
Protein, ur: NEGATIVE mg/dL
SPECIFIC GRAVITY, URINE: 1.005 (ref 1.005–1.030)

## 2015-10-30 LAB — URINE MICROSCOPIC-ADD ON: BACTERIA UA: NONE SEEN

## 2015-10-30 MED ORDER — PHENAZOPYRIDINE HCL 200 MG PO TABS
200.0000 mg | ORAL_TABLET | Freq: Once | ORAL | Status: AC
  Administered 2015-10-30: 200 mg via ORAL
  Filled 2015-10-30: qty 1

## 2015-10-30 MED ORDER — PHENAZOPYRIDINE HCL 200 MG PO TABS: 200.0000 mg | ORAL_TABLET | Freq: Three times a day (TID) | ORAL | Status: DC

## 2015-10-30 NOTE — ED Notes (Signed)
Pt complains of urinary retention for a month, has an appointment on Wednesday at [facility]rology [facility]

## 2015-10-30 NOTE — Discharge Instructions (Signed)
Please read and follow all provided instructions.  Your diagnoses today include:  1. Dysuria     Tests performed today include:  Vital signs. See below for your results today.   Urine test - no infection  Medications prescribed:   Pyridium - medication for urinary tract infection symptoms.   This medication will turn your urine orange. This is normal.    Home care instructions:  Follow any educational materials contained in this packet.  Follow-up instructions: Please follow-up with your primary care provider as needed for further evaluation of your symptoms.  Return instructions:   Please return to the Emergency Department if you experience worsening symptoms.   Please return if you have any other emergent concerns.  Additional Information:  Your vital signs today were: BP 155/100 mmHg   Pulse 95   Temp(Src) 98.1 F (36.7 C) (Oral)   Resp 20   Ht 5\' 7"  (1.702 m)   Wt 72.576 kg   BMI 25.05 kg/m2   SpO2 98% If your blood pressure (BP) was elevated above 135/85 this visit, please have this repeated by your doctor within one month. ---------------

## 2015-10-30 NOTE — ED Provider Notes (Signed)
Plains of burning on urination at penis for one month. Unchanged today. He denies feeling of urinary retention and states he's able to empty his bladder adequately He's been evaluated by his primary care physician for same complaint. No treatment rendered. Has appointment with urology in 2 days. On exam patient is alert nontoxic. Abdomen soft nontender genitalia normal male. No urethral discharge  Orlie Dakin, MD 10/30/15 2120

## 2015-10-30 NOTE — ED Provider Notes (Signed)
CSN: [id number]     Arrival date & time 10/30/15  1926 History   First MD Initiated Contact with Patient 10/30/15 2002     Chief Complaint  Patient presents with  . Urinary Retention     (Consider location/radiation/quality/duration/timing/severity/associated sxs/prior Treatment) HPI Comments: Patient with history of prostate CA, BPH on tamsulosin/finasteride -- presents with c/o burning with urination x 1 month. States he is going frequently but this is not new. He is urinating a normal amount. No fever, N/V. No rectal pain or pain with defecation. No CP, SOB, abd pain. The onset of this condition was acute. The course is constant. Aggravating factors: urination. Alleviating factors: none. He has appointment with his urologist in 2 days.     The history is provided by the patient and medical records.    Past Medical History  Diagnosis Date  . Hypertension   . Prostate cancer ([place]) 2005  . BPH (benign prostatic hypertrophy) 2000-2001  . Tobacco abuse   . Alcohol abuse   . History of nephrolithiasis   . Kidney stones    Past Surgical History  Procedure Laterality Date  . Lithotripsy    . Appendectomy     Family History  Problem Relation Age of Onset  . Cancer Cousin   . Heart disease Mother   . Heart disease Father    Social History  Substance Use Topics  . Smoking status: Former Smoker    Quit date: 01/30/2015  . Smokeless tobacco: Never Used  . Alcohol Use: Yes    Review of Systems  Constitutional: Negative for fever.  HENT: Negative for rhinorrhea and sore throat.   Eyes: Negative for redness.  Respiratory: Negative for cough.   Cardiovascular: Negative for chest pain.  Gastrointestinal: Negative for nausea, vomiting, abdominal pain and diarrhea.  Genitourinary: Positive for dysuria and frequency. Negative for hematuria, discharge, penile swelling, scrotal swelling, genital sores, penile pain and testicular pain.  Musculoskeletal: Negative for myalgias.  Skin:  Negative for rash.  Neurological: Negative for headaches.      Allergies  Review of patient's allergies indicates no known allergies.  Home Medications   Prior to Admission medications   Medication Sig Start Date End Date Taking? Authorizing Provider  amLODipine (NORVASC) 5 MG tablet Take 1 tablet (5 mg total) by mouth daily. 02/21/15   Lorretta Harp, MD  baclofen (LIORESAL) 10 MG tablet Take 10 mg by mouth 3 (three) times daily as needed. 01/31/15   Historical Provider, MD  finasteride (PROSCAR) 5 MG tablet TAKE 1 TABLET BY MOUTH EVERY DAY 01/04/11   Maitri S Kalia-Reynolds, DO  FLUoxetine (PROZAC) 20 MG capsule Take 20 mg by mouth daily. 01/31/15   Historical Provider, MD  lisinopril (PRINIVIL,ZESTRIL) 40 MG tablet Take 1 tablet (40 mg total) by mouth daily. 05/04/15   Lorretta Harp, MD  metoprolol succinate (TOPROL-XL) 50 MG 24 hr tablet Take 1 tablet (50 mg total) by mouth daily. 02/21/15   Lorretta Harp, MD  naproxen sodium (ANAPROX) 220 MG tablet Take 220 mg by mouth 2 (two) times daily with a meal.    Historical Provider, MD  omeprazole (PRILOSEC) 20 MG capsule Take 20 mg by mouth daily. 02/02/15   Historical Provider, MD  Tamsulosin HCl (FLOMAX) 0.4 MG CAPS TAKE ONE CAPSULE BY MOUTH EVERY DAY 11/27/10   Maitri S Kalia-Reynolds, DO   BP 159/104 mmHg  Pulse 107  Temp(Src) 98.6 F (37 C) (Oral)  Resp 18  Ht 5\' 7"  (1.702 m)  Wt 72.576 kg  BMI 25.05 kg/m2  SpO2 99% Physical Exam  Constitutional: He appears well-developed and well-nourished.  HENT:  Head: Normocephalic and atraumatic.  Eyes: Conjunctivae are normal. Right eye exhibits no discharge. Left eye exhibits no discharge.  Neck: Normal range of motion. Neck supple.  Cardiovascular: Normal rate, regular rhythm and normal heart sounds.   Pulmonary/Chest: Effort normal and breath sounds normal.  Abdominal: Soft. There is no tenderness.  Genitourinary: Penis normal. Right testis shows no mass, no swelling and no  tenderness. Left testis shows no mass, no swelling and no tenderness. Circumcised. No penile tenderness. No discharge found.  Neurological: He is alert.  Skin: Skin is warm and dry.  Psychiatric: He has a normal mood and affect.  Nursing note and vitals reviewed.   ED Course  Procedures (including critical care time) Labs Review Labs Reviewed  URINALYSIS, ROUTINE W REFLEX MICROSCOPIC (NOT AT [facility]) - Abnormal; Notable for the following:    Hgb urine dipstick TRACE (*)    All other components within normal limits  URINE MICROSCOPIC-ADD ON - Abnormal; Notable for the following:    Squamous Epithelial / LPF 0-5 (*)    All other components within normal limits  URINE CULTURE    8:10 PM Patient seen and examined. Work-up initiated.    Vital signs reviewed and are as follows: BP 159/104 mmHg  Pulse 107  Temp(Src) 98.6 F (37 C) (Oral)  Resp 18  Ht 5\' 7"  (1.702 m)  Wt 72.576 kg  BMI 25.05 kg/m2  SpO2 99%  8:39 PM 133cc on bladder scan.   9:42 PM Patient discussed with and seen by Dr. Winfred Leeds. Encouraged urology follow-up as planned.   Patient urged to return with worsening symptoms or other concerns. Patient verbalized understanding and agrees with plan.    MDM   Final diagnoses:  Dysuria   Dysuria: UA neg, patient has baseline obstructive symptoms, doubt prostatitis, GU exam is normal, patient is able to urinate and has post-void of 133cc. Follow-up as planned, pyridium to see if this helps with symptoms. Abd soft and non-tender.    Carlisle Cater, PA-C 10/30/15 2144  Orlie Dakin, MD 10/31/15 [phone]

## 2015-10-31 DIAGNOSIS — I10 Essential (primary) hypertension: Secondary | ICD-10-CM | POA: Diagnosis not present

## 2015-10-31 DIAGNOSIS — E559 Vitamin D deficiency, unspecified: Secondary | ICD-10-CM | POA: Diagnosis not present

## 2015-11-01 LAB — URINE CULTURE: Culture: 3000 — AB

## 2015-11-21 DIAGNOSIS — E7211 Homocystinuria: Secondary | ICD-10-CM | POA: Diagnosis not present

## 2015-11-21 DIAGNOSIS — I10 Essential (primary) hypertension: Secondary | ICD-10-CM | POA: Diagnosis not present

## 2016-01-05 DIAGNOSIS — E7211 Homocystinuria: Secondary | ICD-10-CM | POA: Diagnosis not present

## 2016-01-05 DIAGNOSIS — I10 Essential (primary) hypertension: Secondary | ICD-10-CM | POA: Diagnosis not present

## 2016-01-12 DIAGNOSIS — E784 Other hyperlipidemia: Secondary | ICD-10-CM | POA: Diagnosis not present

## 2016-01-12 DIAGNOSIS — I10 Essential (primary) hypertension: Secondary | ICD-10-CM | POA: Diagnosis not present

## 2016-01-12 DIAGNOSIS — N289 Disorder of kidney and ureter, unspecified: Secondary | ICD-10-CM | POA: Diagnosis not present

## 2016-01-12 DIAGNOSIS — E7211 Homocystinuria: Secondary | ICD-10-CM | POA: Diagnosis not present

## 2016-01-12 DIAGNOSIS — Z720 Tobacco use: Secondary | ICD-10-CM | POA: Diagnosis not present

## 2016-01-12 DIAGNOSIS — E538 Deficiency of other specified B group vitamins: Secondary | ICD-10-CM | POA: Diagnosis not present

## 2016-01-12 DIAGNOSIS — E559 Vitamin D deficiency, unspecified: Secondary | ICD-10-CM | POA: Diagnosis not present

## 2016-01-12 DIAGNOSIS — E039 Hypothyroidism, unspecified: Secondary | ICD-10-CM | POA: Diagnosis not present

## 2016-01-12 DIAGNOSIS — M109 Gout, unspecified: Secondary | ICD-10-CM | POA: Diagnosis not present

## 2016-01-30 DIAGNOSIS — I10 Essential (primary) hypertension: Secondary | ICD-10-CM | POA: Diagnosis not present

## 2016-01-30 DIAGNOSIS — E7211 Homocystinuria: Secondary | ICD-10-CM | POA: Diagnosis not present

## 2016-02-01 DIAGNOSIS — E039 Hypothyroidism, unspecified: Secondary | ICD-10-CM | POA: Diagnosis not present

## 2016-02-01 DIAGNOSIS — N289 Disorder of kidney and ureter, unspecified: Secondary | ICD-10-CM | POA: Diagnosis not present

## 2016-02-01 DIAGNOSIS — M109 Gout, unspecified: Secondary | ICD-10-CM | POA: Diagnosis not present

## 2016-02-01 DIAGNOSIS — Z01118 Encounter for examination of ears and hearing with other abnormal findings: Secondary | ICD-10-CM | POA: Diagnosis not present

## 2016-02-01 DIAGNOSIS — R05 Cough: Secondary | ICD-10-CM | POA: Diagnosis not present

## 2016-02-01 DIAGNOSIS — E784 Other hyperlipidemia: Secondary | ICD-10-CM | POA: Diagnosis not present

## 2016-02-01 DIAGNOSIS — H538 Other visual disturbances: Secondary | ICD-10-CM | POA: Diagnosis not present

## 2016-02-01 DIAGNOSIS — Z131 Encounter for screening for diabetes mellitus: Secondary | ICD-10-CM | POA: Diagnosis not present

## 2016-02-01 DIAGNOSIS — I10 Essential (primary) hypertension: Secondary | ICD-10-CM | POA: Diagnosis not present

## 2016-02-01 DIAGNOSIS — Z0000 Encounter for general adult medical examination without abnormal findings: Secondary | ICD-10-CM | POA: Diagnosis not present

## 2016-02-14 DIAGNOSIS — C61 Malignant neoplasm of prostate: Secondary | ICD-10-CM | POA: Diagnosis not present

## 2016-02-14 DIAGNOSIS — N401 Enlarged prostate with lower urinary tract symptoms: Secondary | ICD-10-CM | POA: Diagnosis not present

## 2016-02-14 DIAGNOSIS — R300 Dysuria: Secondary | ICD-10-CM | POA: Diagnosis not present

## 2016-02-14 DIAGNOSIS — R350 Frequency of micturition: Secondary | ICD-10-CM | POA: Diagnosis not present

## 2016-03-07 DIAGNOSIS — I129 Hypertensive chronic kidney disease with stage 1 through stage 4 chronic kidney disease, or unspecified chronic kidney disease: Secondary | ICD-10-CM | POA: Diagnosis not present

## 2016-03-07 DIAGNOSIS — N183 Chronic kidney disease, stage 3 (moderate): Secondary | ICD-10-CM | POA: Diagnosis not present

## 2016-03-07 DIAGNOSIS — R339 Retention of urine, unspecified: Secondary | ICD-10-CM | POA: Diagnosis not present

## 2016-03-07 DIAGNOSIS — E559 Vitamin D deficiency, unspecified: Secondary | ICD-10-CM | POA: Diagnosis not present

## 2016-03-07 DIAGNOSIS — Z720 Tobacco use: Secondary | ICD-10-CM | POA: Diagnosis not present

## 2016-03-11 DIAGNOSIS — N4822 Cellulitis of corpus cavernosum and penis: Secondary | ICD-10-CM | POA: Diagnosis not present

## 2016-03-11 DIAGNOSIS — Z113 Encounter for screening for infections with a predominantly sexual mode of transmission: Secondary | ICD-10-CM | POA: Diagnosis not present

## 2016-03-11 DIAGNOSIS — Z720 Tobacco use: Secondary | ICD-10-CM | POA: Diagnosis not present

## 2016-03-11 DIAGNOSIS — E559 Vitamin D deficiency, unspecified: Secondary | ICD-10-CM | POA: Diagnosis not present

## 2016-03-11 DIAGNOSIS — N289 Disorder of kidney and ureter, unspecified: Secondary | ICD-10-CM | POA: Diagnosis not present

## 2016-03-11 DIAGNOSIS — I10 Essential (primary) hypertension: Secondary | ICD-10-CM | POA: Diagnosis not present

## 2016-03-11 DIAGNOSIS — M109 Gout, unspecified: Secondary | ICD-10-CM | POA: Diagnosis not present

## 2016-03-11 DIAGNOSIS — E784 Other hyperlipidemia: Secondary | ICD-10-CM | POA: Diagnosis not present

## 2016-03-11 DIAGNOSIS — E039 Hypothyroidism, unspecified: Secondary | ICD-10-CM | POA: Diagnosis not present

## 2016-03-15 DIAGNOSIS — I10 Essential (primary) hypertension: Secondary | ICD-10-CM | POA: Diagnosis not present

## 2016-03-15 DIAGNOSIS — E7211 Homocystinuria: Secondary | ICD-10-CM | POA: Diagnosis not present

## 2016-04-11 DIAGNOSIS — E7211 Homocystinuria: Secondary | ICD-10-CM | POA: Diagnosis not present

## 2016-04-11 DIAGNOSIS — I10 Essential (primary) hypertension: Secondary | ICD-10-CM | POA: Diagnosis not present

## 2016-04-22 DIAGNOSIS — E784 Other hyperlipidemia: Secondary | ICD-10-CM | POA: Diagnosis not present

## 2016-04-22 DIAGNOSIS — R739 Hyperglycemia, unspecified: Secondary | ICD-10-CM | POA: Diagnosis not present

## 2016-04-22 DIAGNOSIS — I10 Essential (primary) hypertension: Secondary | ICD-10-CM | POA: Diagnosis not present

## 2016-04-22 DIAGNOSIS — E039 Hypothyroidism, unspecified: Secondary | ICD-10-CM | POA: Diagnosis not present

## 2016-04-22 DIAGNOSIS — E7211 Homocystinuria: Secondary | ICD-10-CM | POA: Diagnosis not present

## 2016-04-22 DIAGNOSIS — E559 Vitamin D deficiency, unspecified: Secondary | ICD-10-CM | POA: Diagnosis not present

## 2016-04-22 DIAGNOSIS — Z720 Tobacco use: Secondary | ICD-10-CM | POA: Diagnosis not present

## 2016-04-22 DIAGNOSIS — M109 Gout, unspecified: Secondary | ICD-10-CM | POA: Diagnosis not present

## 2016-04-22 DIAGNOSIS — N289 Disorder of kidney and ureter, unspecified: Secondary | ICD-10-CM | POA: Diagnosis not present

## 2016-05-08 DIAGNOSIS — I10 Essential (primary) hypertension: Secondary | ICD-10-CM | POA: Diagnosis not present

## 2016-05-08 DIAGNOSIS — E7211 Homocystinuria: Secondary | ICD-10-CM | POA: Diagnosis not present

## 2016-05-21 DIAGNOSIS — E7211 Homocystinuria: Secondary | ICD-10-CM | POA: Diagnosis not present

## 2016-05-21 DIAGNOSIS — E784 Other hyperlipidemia: Secondary | ICD-10-CM | POA: Diagnosis not present

## 2016-05-21 DIAGNOSIS — E559 Vitamin D deficiency, unspecified: Secondary | ICD-10-CM | POA: Diagnosis not present

## 2016-05-21 DIAGNOSIS — R739 Hyperglycemia, unspecified: Secondary | ICD-10-CM | POA: Diagnosis not present

## 2016-05-21 DIAGNOSIS — M109 Gout, unspecified: Secondary | ICD-10-CM | POA: Diagnosis not present

## 2016-05-21 DIAGNOSIS — I10 Essential (primary) hypertension: Secondary | ICD-10-CM | POA: Diagnosis not present

## 2016-05-21 DIAGNOSIS — N289 Disorder of kidney and ureter, unspecified: Secondary | ICD-10-CM | POA: Diagnosis not present

## 2016-05-21 DIAGNOSIS — Z720 Tobacco use: Secondary | ICD-10-CM | POA: Diagnosis not present

## 2016-05-21 DIAGNOSIS — E039 Hypothyroidism, unspecified: Secondary | ICD-10-CM | POA: Diagnosis not present

## 2016-06-06 DIAGNOSIS — E7211 Homocystinuria: Secondary | ICD-10-CM | POA: Diagnosis not present

## 2016-06-06 DIAGNOSIS — I10 Essential (primary) hypertension: Secondary | ICD-10-CM | POA: Diagnosis not present

## 2016-06-19 DIAGNOSIS — N401 Enlarged prostate with lower urinary tract symptoms: Secondary | ICD-10-CM | POA: Diagnosis not present

## 2016-06-19 DIAGNOSIS — R3911 Hesitancy of micturition: Secondary | ICD-10-CM | POA: Diagnosis not present

## 2016-06-19 DIAGNOSIS — C61 Malignant neoplasm of prostate: Secondary | ICD-10-CM | POA: Diagnosis not present

## 2016-06-24 DIAGNOSIS — N183 Chronic kidney disease, stage 3 (moderate): Secondary | ICD-10-CM | POA: Diagnosis not present

## 2016-06-26 DIAGNOSIS — Z720 Tobacco use: Secondary | ICD-10-CM | POA: Diagnosis not present

## 2016-06-26 DIAGNOSIS — R339 Retention of urine, unspecified: Secondary | ICD-10-CM | POA: Diagnosis not present

## 2016-06-26 DIAGNOSIS — N183 Chronic kidney disease, stage 3 (moderate): Secondary | ICD-10-CM | POA: Diagnosis not present

## 2016-06-26 DIAGNOSIS — E559 Vitamin D deficiency, unspecified: Secondary | ICD-10-CM | POA: Diagnosis not present

## 2016-06-26 DIAGNOSIS — I129 Hypertensive chronic kidney disease with stage 1 through stage 4 chronic kidney disease, or unspecified chronic kidney disease: Secondary | ICD-10-CM | POA: Diagnosis not present

## 2016-07-05 DIAGNOSIS — R350 Frequency of micturition: Secondary | ICD-10-CM | POA: Diagnosis not present

## 2016-07-05 DIAGNOSIS — R3911 Hesitancy of micturition: Secondary | ICD-10-CM | POA: Diagnosis not present

## 2016-07-10 DIAGNOSIS — R3915 Urgency of urination: Secondary | ICD-10-CM | POA: Diagnosis not present

## 2016-07-10 DIAGNOSIS — N401 Enlarged prostate with lower urinary tract symptoms: Secondary | ICD-10-CM | POA: Diagnosis not present

## 2016-07-10 DIAGNOSIS — R300 Dysuria: Secondary | ICD-10-CM | POA: Diagnosis not present

## 2016-07-10 DIAGNOSIS — R350 Frequency of micturition: Secondary | ICD-10-CM | POA: Diagnosis not present

## 2016-07-17 ENCOUNTER — Other Ambulatory Visit: Payer: Self-pay | Admitting: Urology

## 2016-07-18 ENCOUNTER — Encounter (HOSPITAL_COMMUNITY): Payer: Self-pay | Admitting: *Deleted

## 2016-07-31 NOTE — Patient Instructions (Signed)
[place] Rhew Cooperman  07/31/2016   Your procedure is scheduled on: 08/05/16  Report to [facility] Main  Entrance take [place]  elevators to 3rd floor to  [place] at     [phone].  Call this number if you have problems the morning of surgery [phone]   Remember: ONLY 1 PERSON MAY GO WITH YOU TO SHORT STAY TO GET  READY MORNING OF [place].  Do not eat food or drink liquids :After Midnight.     Take these medicines the morning of surgery with A SIP OF WATER: AMLODIPINE, SYNTHROID, METOPROLOL, FLOMAX, PROSCAR                                You may not have any metal on your body including hair pins and              piercings  Do not wear jewelry,  lotions, powders or perfumes, deodorant                         Men may shave face and neck.   Do not bring valuables to the hospital. [place].  Contacts, dentures or bridgework may not be worn into surgery.  Leave suitcase in the car. After surgery it may be brought to your room.                  Please read over the following fact sheets you were given: _____________________________________________________________________             [facility] - Preparing for Surgery Before surgery, you can play an important role.  Because skin is not sterile, your skin needs to be as free of germs as possible.  You can reduce the number of germs on your skin by washing with CHG (chlorahexidine gluconate) soap before surgery.  CHG is an antiseptic cleaner which kills germs and bonds with the skin to continue killing germs even after washing. Please DO NOT use if you have an allergy to CHG or antibacterial soaps.  If your skin becomes reddened/irritated stop using the CHG and inform your nurse when you arrive at Short Stay. Do not shave (including legs and underarms) for at least 48 hours prior to the first CHG shower.  You may shave your face/neck. Please follow these  instructions carefully:  1.  Shower with CHG Soap the night before surgery and the  morning of Surgery.  2.  If you choose to wash your hair, wash your hair first as usual with your  normal  shampoo.  3.  After you shampoo, rinse your hair and body thoroughly to remove the  shampoo.                           4.  Use CHG as you would any other liquid soap.  You can apply chg directly  to the skin and wash                       Gently with a scrungie or clean washcloth.  5.  Apply the CHG Soap to your body ONLY FROM THE NECK DOWN.  Do not use on face/ open                           Wound or open sores. Avoid contact with eyes, ears mouth and genitals (private parts).                       Wash face,  Genitals (private parts) with your normal soap.             6.  Wash thoroughly, paying special attention to the area where your surgery  will be performed.  7.  Thoroughly rinse your body with warm water from the neck down.  8.  DO NOT shower/wash with your normal soap after using and rinsing off  the CHG Soap.                9.  Pat yourself dry with a clean towel.            10.  Wear clean pajamas.            11.  Place clean sheets on your bed the night of your first shower and do not  sleep with pets. Day of Surgery : Do not apply any lotions/deodorants the morning of surgery.  Please wear clean clothes to the hospital/surgery center.  FAILURE TO FOLLOW THESE INSTRUCTIONS MAY RESULT IN THE CANCELLATION OF YOUR SURGERY PATIENT SIGNATURE_________________________________  NURSE SIGNATURE__________________________________  ________________________________________________________________________

## 2016-08-01 ENCOUNTER — Encounter (HOSPITAL_COMMUNITY): Payer: Self-pay

## 2016-08-01 ENCOUNTER — Encounter (HOSPITAL_COMMUNITY)
Admission: RE | Admit: 2016-08-01 | Discharge: 2016-08-01 | Disposition: A | Discharge status: Home / Self Care | Payer: Medicare HMO | Source: Ambulatory Visit | Attending: Urology | Admitting: Urology

## 2016-08-01 DIAGNOSIS — Z01812 Encounter for preprocedural laboratory examination: Secondary | ICD-10-CM | POA: Diagnosis not present

## 2016-08-01 HISTORY — DX: Personal history of urinary calculi: Z87.442

## 2016-08-01 LAB — CBC
HEMATOCRIT: 39 % (ref 39.0–52.0)
Hemoglobin: 13.2 g/dL (ref 13.0–17.0)
MCH: 30.8 pg (ref 26.0–34.0)
MCHC: 33.8 g/dL (ref 30.0–36.0)
MCV: 90.9 fL (ref 78.0–100.0)
Platelets: 263 10*3/uL (ref 150–400)
RBC: 4.29 MIL/uL (ref 4.22–5.81)
RDW: 13.7 % (ref 11.5–15.5)
WBC: 9.1 10*3/uL (ref 4.0–10.5)

## 2016-08-01 LAB — COMPREHENSIVE METABOLIC PANEL
ALBUMIN: 4.1 g/dL (ref 3.5–5.0)
ALK PHOS: 52 U/L (ref 38–126)
ALT: 13 U/L — ABNORMAL LOW (ref 17–63)
AST: 17 U/L (ref 15–41)
Anion gap: 9 (ref 5–15)
BUN: 18 mg/dL (ref 6–20)
CO2: 28 mmol/L (ref 22–32)
Calcium: 10 mg/dL (ref 8.9–10.3)
Chloride: 104 mmol/L (ref 101–111)
Creatinine, Ser: 1.39 mg/dL — ABNORMAL HIGH (ref 0.61–1.24)
GFR calc Af Amer: 57 mL/min — ABNORMAL LOW (ref >60)
GFR calc non Af Amer: 49 mL/min — ABNORMAL LOW (ref >60)
GLUCOSE: 126 mg/dL — AB (ref 65–99)
Potassium: 4.1 mmol/L (ref 3.5–5.1)
SODIUM: 141 mmol/L (ref 135–145)
Total Bilirubin: 0.8 mg/dL (ref 0.3–1.2)
Total Protein: 7.7 g/dL (ref 6.5–8.1)

## 2016-08-01 NOTE — Progress Notes (Signed)
Stress test 01/2015 and echo 01/2015 ef 65-70%  In epic

## 2016-08-04 NOTE — H&P (Signed)
Office Visit Report     07/10/2016     --------------------------------------------------------------------------------     Ricardo Johnson   MRN: [id number]  PRIMARY CARE:  Ricardo Mccreedy, MD   DOB: 1945-08-24, 71 year old Male  REFERRING:  Ricardo Bring, MD   SSN: [id number]  PROVIDER:  Raynelle Johnson, M.D.     LOCATION:  [facility] [phone]     --------------------------------------------------------------------------------     CC/HPI: BPH/LUTS     Ricardo Johnson returns today for complaints of severe lower urinary tract symptoms including dysuria, frequency, urgency, weak stream, etc. He did undergo a urodynamic study last week. He is scheduled to undergo cystoscopy today.     His urodynamic study indicated severe bladder outlet obstruction with voiding pressures of over 250 cm of water with a flow of only 2 cc/s. He did have a decreased bladder capacity and hypersensitive bladder.        ALLERGIES: No Allergies       MEDICATIONS: Finasteride 5 mg tablet 1 tablet PO Daily   Levothyroxine Sodium 100 mcg tablet   Amlodipine Besylate 10 mg tablet   Lisinopril-Hydrochlorothiazide 20 mg-25 mg tablet   Metoprolol Tartrate 25 MG Oral Tablet Oral   Tamsulosin HCl - 0.4 MG Oral Capsule 0 Oral        GU PSH: Complex cystometrogram, w/ void pressure and urethral pressure profile studies, any technique - 07/05/2016  Complex Uroflow - 07/05/2016  Emg surf Electrd - 07/05/2016  Inject For cystogram - 07/05/2016  Intrabd voidng Press - 07/05/2016  PLACE RT DEVICE/MARKER, PROS - 2009         PSH Notes: Prostate Place Interstitial Dev For Radiation Guide Multiple     NON-GU PSH: None     GU PMH: Urinary Hesitancy - 06/19/2016  BPH w/LUTS, Benign prostatic hyperplasia (BPH) with straining on urination - 10/28/2015  Dysuria, Dysuria - 10/28/2015  Other microscopic hematuria, Microscopic hematuria - 10/28/2015  Prostate Cancer, Prostate cancer - 10/28/2015  ED, arterial  insufficiency, Erectile dysfunction due to arterial insufficiency - 2014  Prostate Cancer, History, Prostate Cancer - 2014  Urinary Tract Inf, Unspec site, Urinary tract infection - 2014         PMH Notes:     1) Prostate cancer: He is s/p radiation seed implantation in January 2005 under the care of Dr. Joelyn Johnson. He has had no evidence for disease recurrence thus far. He has been non-compliant with recommended follow up.     TNM stage: cT1c Nx Mx   Gleason score: 3+3=6   PSA nadir: 0.03 (Dec 2011)     2) BPH/LUTS: He has been treated with medical therapy with good results until December 2011 when his symptoms were noted to be markedly worse including frequency, urgency, and nocturia (up to 10 times per night). He was non-compliant with f/u and again presented to me in August 2017 with worsening LUTS (IPSS 33). He was not on finasteride at that time and this was restarted but without improvement.     Current treatment: Tamsulosin 0.4 mg, Finasteride 5 mg     3) Erectile dysfunction: He has had a good response to Levitra in the past.     Current treatment: Levitra 20 mg     4) Microscopic hematuria: This was incidentally noted in December 2011. He has a history of smoking and did receive pelvic radiation for prostate cancer. He underwent a complete urologic evaluation in February 2012 including CT imaging  which demonstrated bilateral renal calculi and diffuse bladder thickening but cystoscopy and cytology were negative.     5) Urolithiasis: He has incidentally detected asymptomatic bilateral renal calculi.        NON-GU PMH: Personal history of other diseases of the circulatory system, History of hypertension - 2014  Personal history of other endocrine, nutritional and metabolic disease, History of hypercholesterolemia - 2014  Heartburn       FAMILY HISTORY: 3 daughters - Ricardo Johnson  5 sons - Ricardo Johnson     SOCIAL HISTORY: Marital Status: Widowed  Current Smoking Status: Patient has never  smoked.   Social Drinker.   Drinks 1 caffeinated drink per day.       Notes: Marital History - Currently Married, Alcohol Use, Occupation:, Tobacco Use     REVIEW OF SYSTEMS:     GU Review Male:   Patient reports hard to postpone urination, burning/ pain with urination, get up at night to urinate, trouble starting your streams, and have to strain to urinate . Patient denies frequent urination, leakage of urine, and stream starts and stops.   Gastrointestinal (Lower):   Patient denies diarrhea and constipation.   Gastrointestinal (Upper):   Patient denies nausea and vomiting.   Constitutional:   Patient denies fever, night sweats, weight loss, and fatigue.   Skin:   Patient denies skin rash/ lesion and itching.   Eyes:   Patient denies blurred vision and double vision.   Ears/ Nose/ Throat:   Patient denies sore throat and sinus problems.   Hematologic/Lymphatic:   Patient denies easy bruising and swollen glands.   Cardiovascular:   Patient denies leg swelling and chest pains.   Respiratory:   Patient denies cough and shortness of breath.   Endocrine:   Patient denies excessive thirst.   Musculoskeletal:   Patient reports back pain. Patient denies joint pain.   Neurological:   Patient denies headaches and dizziness.   Psychologic:   Patient denies depression and anxiety.     VITAL SIGNS:       07/10/2016 04:25 PM   Weight 160 lb / 72.57 kg   Height 66 in / 167.64 cm   BP 136/78 mmHg   Pulse 71 /min   BMI 25.8 kg/m     MULTI-SYSTEM PHYSICAL EXAMINATION:     Constitutional: Well-nourished. No physical deformities. Normally developed. Good grooming.   Neck: Neck symmetrical, not swollen. Normal tracheal position.   Respiratory: No labored breathing, no use of accessory muscles.    Cardiovascular: Normal temperature, normal extremity pulses, no swelling, no varicosities.   Lymphatic: No enlargement of neck, axillae, groin.   Skin: No paleness, no jaundice, no cyanosis. No lesion, no  ulcer, no rash.   Neurologic / Psychiatric: Oriented to time, oriented to place, oriented to person. No depression, no anxiety, no agitation.   Gastrointestinal: No mass, no tenderness, no rigidity, non obese abdomen.   Eyes: Normal conjunctivae. Normal eyelids.   Ears, Nose, Mouth, and Throat: Left ear no scars, no lesions, no masses. Right ear no scars, no lesions, no masses. Nose no scars, no lesions, no masses. Normal hearing. Normal lips.   Musculoskeletal: Normal gait and station of head and neck.        PAST DATA REVIEWED:   Source Of History:  Patient   Lab Test Review:   PSA   Urine Test Review:   Urinalysis   Urodynamics Review:   Review Urodynamics Tests    02/14/16 04/26/11 06/08/10 02/24/09 04/05/08 10/06/07  04/02/07 09/26/06   PSA   Total PSA 0.05 ng/dl 0.03  0.03  0.07  0.10  0.10  0.15  0.25        PROCEDURES:          Flexible Cystoscopy - 52000   Indication: LUTS  Risks, benefits, and potential complications of the procedure were discussed with the patient including infection, bleeding, voiding discomfort, urinary retention, fever, chills, sepsis, and others. All questions were answered. Informed consent was obtained. Sterile technique and intraurethral analgesia were used.   Meatus:  Normal size. Normal location. Normal condition.   Urethra:  No strictures.   External Sphincter:  Normal.   Prostate:  Examination of the prostatic urethra was obliterated toward the bladder neck with what appeared to be dystrophic calcification. After multiple attempts of trying to pass the flexible cystoscope into the bladder under direct visualization, this was unable to be performed and due to patient discomfort, I stopped the procedure.   Bladder Neck:  Obstructed.   Ureteral Orifices:  Normal location. Normal size. Normal shape. Effluxed clear urine.   Bladder:  No trabeculation. No tumors. Normal mucosa. No stones.         Chaperone: Karie Schwalbe, MS3  The procedure was well-tolerated  and without complications. Instructions were given to call the office immediately if questions or problems.            Urinalysis w/Scope - 81001  Dipstick Dipstick Cont'd Micro   Color: Orange Bilirubin: Neg WBC/hpf: 6 - 10/hpf   Appearance: Clear Ketones: Neg RBC/hpf: 3 - 10/hpf   Specific Gravity: 1.025 Blood: Trace Bacteria: Rare (0-9/hpf)   pH: 5.5 Protein: 1+ Cystals: NS (Not Seen)   Glucose: Neg Urobilinogen: 1.0 Casts: NS (Not Seen)     Nitrites: Invalid Trichomonas: Not Present     Leukocyte Esterase: Trace Mucous: Present       Epithelial Cells: NS (Not Seen)       Yeast: NS (Not Seen)       Sperm: Not Present       Notes:   COLOR INTERFERENCE       ASSESSMENT:       ICD-10 Details   1 GU:   Dysuria - R30.0    2   BPH w/LUTS - N40.1      PLAN:             Orders  Labs Urine Culture and Sensitivity             Schedule  Return Visit/Planned Activity: Other See Visit Notes              Note: Will call to schedule surgery             Document  Letter(s):  Created for Patient: Clinical Summary            Notes:   1. Bladder outlet obstruction: He appears to have severe bladder outlet obstruction based on his urodynamic functional study and anatomically on cystoscopy. I am unsure of the exact cause of his obstruction at this point although this may be related to dystrophic calcification or stones. I have recommended that he proceed with an operative procedure for cystoscopy under anesthesia and further evaluation of his posterior urethra and bladder. We discussed proceeding with a possible transurethral resection of obstructing tissue albeit carefully considering his history of radiation therapy. He understands that I would not proceed with a full formal TURP. We did review the potential risks of this  procedure including but not limited to bleeding, infection, incontinence, recurrence of symptoms, stricture formation, etc. He gives his informed consent to proceed.      2. Prostate cancer: His next PSA will be due for the summer of 2018.     Cc: Dr. Iona Beard Osei-Bonsu       * Signed by Ricardo Johnson, M.D. on 07/10/16 at 6:15 PM (EST)*

## 2016-08-05 ENCOUNTER — Encounter (HOSPITAL_COMMUNITY)
Admission: RE | Disposition: A | Discharge status: Home / Self Care | Payer: Self-pay | Source: Ambulatory Visit | Attending: Urology

## 2016-08-05 ENCOUNTER — Ambulatory Visit (HOSPITAL_COMMUNITY): Payer: Medicare HMO | Admitting: Registered Nurse

## 2016-08-05 ENCOUNTER — Encounter (HOSPITAL_COMMUNITY): Payer: Self-pay | Admitting: *Deleted

## 2016-08-05 ENCOUNTER — Ambulatory Visit (HOSPITAL_COMMUNITY)
Admission: RE | Admit: 2016-08-05 | Discharge: 2016-08-05 | Disposition: A | Discharge status: Home / Self Care | Payer: Medicare HMO | Source: Ambulatory Visit | Attending: Urology | Admitting: Urology

## 2016-08-05 DIAGNOSIS — E7800 Pure hypercholesterolemia, unspecified: Secondary | ICD-10-CM

## 2016-08-05 DIAGNOSIS — N320 Bladder-neck obstruction: Secondary | ICD-10-CM | POA: Diagnosis present

## 2016-08-05 DIAGNOSIS — R55 Syncope and collapse: Secondary | ICD-10-CM | POA: Diagnosis not present

## 2016-08-05 DIAGNOSIS — I10 Essential (primary) hypertension: Secondary | ICD-10-CM | POA: Diagnosis present

## 2016-08-05 DIAGNOSIS — N138 Other obstructive and reflux uropathy: Secondary | ICD-10-CM | POA: Insufficient documentation

## 2016-08-05 DIAGNOSIS — R3915 Urgency of urination: Secondary | ICD-10-CM | POA: Insufficient documentation

## 2016-08-05 DIAGNOSIS — R269 Unspecified abnormalities of gait and mobility: Secondary | ICD-10-CM | POA: Diagnosis not present

## 2016-08-05 DIAGNOSIS — R300 Dysuria: Secondary | ICD-10-CM | POA: Insufficient documentation

## 2016-08-05 DIAGNOSIS — E1165 Type 2 diabetes mellitus with hyperglycemia: Secondary | ICD-10-CM | POA: Diagnosis present

## 2016-08-05 DIAGNOSIS — Z8546 Personal history of malignant neoplasm of prostate: Secondary | ICD-10-CM | POA: Diagnosis not present

## 2016-08-05 DIAGNOSIS — R3129 Other microscopic hematuria: Secondary | ICD-10-CM

## 2016-08-05 DIAGNOSIS — R739 Hyperglycemia, unspecified: Secondary | ICD-10-CM | POA: Diagnosis not present

## 2016-08-05 DIAGNOSIS — N179 Acute kidney failure, unspecified: Secondary | ICD-10-CM | POA: Diagnosis present

## 2016-08-05 DIAGNOSIS — E876 Hypokalemia: Secondary | ICD-10-CM | POA: Diagnosis present

## 2016-08-05 DIAGNOSIS — N200 Calculus of kidney: Secondary | ICD-10-CM

## 2016-08-05 DIAGNOSIS — Z923 Personal history of irradiation: Secondary | ICD-10-CM | POA: Diagnosis not present

## 2016-08-05 DIAGNOSIS — R509 Fever, unspecified: Secondary | ICD-10-CM | POA: Diagnosis not present

## 2016-08-05 DIAGNOSIS — R3912 Poor urinary stream: Secondary | ICD-10-CM

## 2016-08-05 DIAGNOSIS — R404 Transient alteration of awareness: Secondary | ICD-10-CM | POA: Diagnosis not present

## 2016-08-05 DIAGNOSIS — Z87891 Personal history of nicotine dependence: Secondary | ICD-10-CM

## 2016-08-05 DIAGNOSIS — S199XXA Unspecified injury of neck, initial encounter: Secondary | ICD-10-CM | POA: Diagnosis not present

## 2016-08-05 DIAGNOSIS — Z79899 Other long term (current) drug therapy: Secondary | ICD-10-CM | POA: Diagnosis not present

## 2016-08-05 DIAGNOSIS — N3001 Acute cystitis with hematuria: Secondary | ICD-10-CM | POA: Diagnosis not present

## 2016-08-05 DIAGNOSIS — S0990XA Unspecified injury of head, initial encounter: Secondary | ICD-10-CM | POA: Diagnosis not present

## 2016-08-05 DIAGNOSIS — N390 Urinary tract infection, site not specified: Secondary | ICD-10-CM | POA: Diagnosis present

## 2016-08-05 DIAGNOSIS — N529 Male erectile dysfunction, unspecified: Secondary | ICD-10-CM | POA: Insufficient documentation

## 2016-08-05 DIAGNOSIS — R339 Retention of urine, unspecified: Secondary | ICD-10-CM | POA: Diagnosis present

## 2016-08-05 DIAGNOSIS — C61 Malignant neoplasm of prostate: Secondary | ICD-10-CM | POA: Diagnosis present

## 2016-08-05 DIAGNOSIS — N400 Enlarged prostate without lower urinary tract symptoms: Secondary | ICD-10-CM | POA: Diagnosis not present

## 2016-08-05 DIAGNOSIS — A419 Sepsis, unspecified organism: Secondary | ICD-10-CM | POA: Diagnosis not present

## 2016-08-05 DIAGNOSIS — R350 Frequency of micturition: Secondary | ICD-10-CM

## 2016-08-05 DIAGNOSIS — Z9049 Acquired absence of other specified parts of digestive tract: Secondary | ICD-10-CM | POA: Diagnosis not present

## 2016-08-05 DIAGNOSIS — E039 Hypothyroidism, unspecified: Secondary | ICD-10-CM

## 2016-08-05 DIAGNOSIS — Z9889 Other specified postprocedural states: Secondary | ICD-10-CM | POA: Diagnosis not present

## 2016-08-05 DIAGNOSIS — Z87442 Personal history of urinary calculi: Secondary | ICD-10-CM | POA: Diagnosis not present

## 2016-08-05 DIAGNOSIS — F10129 Alcohol abuse with intoxication, unspecified: Secondary | ICD-10-CM | POA: Diagnosis not present

## 2016-08-05 DIAGNOSIS — R531 Weakness: Secondary | ICD-10-CM | POA: Diagnosis not present

## 2016-08-05 DIAGNOSIS — Z9119 Patient's noncompliance with other medical treatment and regimen: Secondary | ICD-10-CM

## 2016-08-05 DIAGNOSIS — R110 Nausea: Secondary | ICD-10-CM | POA: Diagnosis not present

## 2016-08-05 DIAGNOSIS — Z8249 Family history of ischemic heart disease and other diseases of the circulatory system: Secondary | ICD-10-CM | POA: Diagnosis not present

## 2016-08-05 DIAGNOSIS — R3914 Feeling of incomplete bladder emptying: Secondary | ICD-10-CM | POA: Diagnosis not present

## 2016-08-05 DIAGNOSIS — D649 Anemia, unspecified: Secondary | ICD-10-CM | POA: Diagnosis present

## 2016-08-05 DIAGNOSIS — R338 Other retention of urine: Secondary | ICD-10-CM | POA: Diagnosis not present

## 2016-08-05 DIAGNOSIS — N3000 Acute cystitis without hematuria: Secondary | ICD-10-CM | POA: Diagnosis not present

## 2016-08-05 DIAGNOSIS — N401 Enlarged prostate with lower urinary tract symptoms: Secondary | ICD-10-CM | POA: Diagnosis present

## 2016-08-05 HISTORY — PX: CYSTOSCOPY: SHX5120

## 2016-08-05 SURGERY — CYSTOSCOPY
Anesthesia: General

## 2016-08-05 MED ORDER — CIPROFLOXACIN IN D5W 400 MG/200ML IV SOLN
400.0000 mg | INTRAVENOUS | Status: AC
  Administered 2016-08-05: 400 mg via INTRAVENOUS

## 2016-08-05 MED ORDER — MIDAZOLAM HCL 2 MG/2ML IJ SOLN
INTRAMUSCULAR | Status: AC
  Filled 2016-08-05: qty 2

## 2016-08-05 MED ORDER — HYDROMORPHONE HCL 1 MG/ML IJ SOLN
INTRAMUSCULAR | Status: AC
  Filled 2016-08-05: qty 1

## 2016-08-05 MED ORDER — LABETALOL HCL 5 MG/ML IV SOLN
INTRAVENOUS | Status: AC
  Filled 2016-08-05: qty 4

## 2016-08-05 MED ORDER — FENTANYL CITRATE (PF) 100 MCG/2ML IJ SOLN
INTRAMUSCULAR | Status: AC
  Filled 2016-08-05: qty 2

## 2016-08-05 MED ORDER — STERILE WATER FOR IRRIGATION IR SOLN
Status: DC | PRN
  Administered 2016-08-05: 7000 mL via INTRAVESICAL

## 2016-08-05 MED ORDER — FENTANYL CITRATE (PF) 250 MCG/5ML IJ SOLN
INTRAMUSCULAR | Status: DC | PRN
  Administered 2016-08-05: 25 ug via INTRAVENOUS
  Administered 2016-08-05: 50 ug via INTRAVENOUS
  Administered 2016-08-05: 100 ug via INTRAVENOUS
  Administered 2016-08-05: 25 ug via INTRAVENOUS

## 2016-08-05 MED ORDER — LIDOCAINE 2% (20 MG/ML) 5 ML SYRINGE
INTRAMUSCULAR | Status: DC | PRN
  Administered 2016-08-05: 60 mg via INTRAVENOUS

## 2016-08-05 MED ORDER — PROPOFOL 10 MG/ML IV BOLUS
INTRAVENOUS | Status: AC
  Filled 2016-08-05: qty 20

## 2016-08-05 MED ORDER — HYDROCODONE-ACETAMINOPHEN 5-325 MG PO TABS: 1.0000 | ORAL_TABLET | Freq: Four times a day (QID) | ORAL | 0 refills | Status: DC | PRN

## 2016-08-05 MED ORDER — SODIUM CHLORIDE 0.9 % IR SOLN
Status: DC | PRN
  Administered 2016-08-05: 6000 mL via INTRAVESICAL

## 2016-08-05 MED ORDER — HYDROMORPHONE HCL 1 MG/ML IJ SOLN
0.2500 mg | INTRAMUSCULAR | Status: DC | PRN
  Administered 2016-08-05 (×4): 0.5 mg via INTRAVENOUS

## 2016-08-05 MED ORDER — LACTATED RINGERS IV SOLN
INTRAVENOUS | Status: DC
  Administered 2016-08-05: 1000 mL via INTRAVENOUS

## 2016-08-05 MED ORDER — LABETALOL HCL 5 MG/ML IV SOLN
5.0000 mg | Freq: Once | INTRAVENOUS | Status: AC
  Administered 2016-08-05: 5 mg via INTRAVENOUS

## 2016-08-05 MED ORDER — CIPROFLOXACIN IN D5W 400 MG/200ML IV SOLN
INTRAVENOUS | Status: AC
  Filled 2016-08-05: qty 200

## 2016-08-05 MED ORDER — ONDANSETRON HCL 4 MG/2ML IJ SOLN
INTRAMUSCULAR | Status: DC | PRN
  Administered 2016-08-05: 4 mg via INTRAVENOUS

## 2016-08-05 MED ORDER — HYDROCODONE-ACETAMINOPHEN 5-325 MG PO TABS
1.0000 | ORAL_TABLET | Freq: Four times a day (QID) | ORAL | Status: AC | PRN
  Administered 2016-08-05 (×2): 1 via ORAL
  Filled 2016-08-05: qty 1

## 2016-08-05 MED ORDER — HYDROCODONE-ACETAMINOPHEN 5-325 MG PO TABS
ORAL_TABLET | ORAL | Status: AC
  Filled 2016-08-05: qty 1

## 2016-08-05 MED ORDER — MEPERIDINE HCL 50 MG/ML IJ SOLN: 6.2500 mg | INTRAMUSCULAR | Status: DC | PRN

## 2016-08-05 MED ORDER — LACTATED RINGERS IV SOLN: INTRAVENOUS | Status: DC

## 2016-08-05 MED ORDER — PROMETHAZINE HCL 25 MG/ML IJ SOLN: 6.2500 mg | INTRAMUSCULAR | Status: DC | PRN

## 2016-08-05 MED ORDER — PROPOFOL 10 MG/ML IV BOLUS
INTRAVENOUS | Status: DC | PRN
  Administered 2016-08-05: 200 mg via INTRAVENOUS

## 2016-08-05 MED ORDER — MIDAZOLAM HCL 2 MG/2ML IJ SOLN
INTRAMUSCULAR | Status: DC | PRN
  Administered 2016-08-05: 2 mg via INTRAVENOUS

## 2016-08-05 SURGICAL SUPPLY — 19 items
BAG URINE DRAINAGE (UROLOGICAL SUPPLIES) ×1 IMPLANT
BAG URO CATCHER STRL LF (MISCELLANEOUS) ×3 IMPLANT
CATH HEMA 3WAY 30CC 22FR COUDE (CATHETERS) IMPLANT
CATH INTERMIT  6FR 70CM (CATHETERS) ×1 IMPLANT
CLOTH BEACON ORANGE TIMEOUT ST (SAFETY) ×3 IMPLANT
ELECT REM PT RETURN 9FT ADLT (ELECTROSURGICAL)
ELECTRODE REM PT RTRN 9FT ADLT (ELECTROSURGICAL) ×1 IMPLANT
GLOVE BIOGEL M STRL SZ7.5 (GLOVE) ×3 IMPLANT
GOWN STRL REUS W/TWL LRG LVL3 (GOWN DISPOSABLE) ×6 IMPLANT
GOWN STRL REUS W/TWL XL LVL3 (GOWN DISPOSABLE) ×3 IMPLANT
GUIDEWIRE STR DUAL SENSOR (WIRE) ×3 IMPLANT
HOLDER FOLEY CATH W/STRAP (MISCELLANEOUS) IMPLANT
LOOP CUT BIPOLAR 24F LRG (ELECTROSURGICAL) ×2 IMPLANT
MANIFOLD NEPTUNE II (INSTRUMENTS) ×3 IMPLANT
PACK CYSTO (CUSTOM PROCEDURE TRAY) ×3 IMPLANT
SET ASPIRATION TUBING (TUBING) IMPLANT
SYRINGE IRR TOOMEY STRL 70CC (SYRINGE) ×3 IMPLANT
TUBING CONNECTING 10 (TUBING) ×2 IMPLANT
TUBING CONNECTING 10' (TUBING) ×1

## 2016-08-05 NOTE — Anesthesia Preprocedure Evaluation (Addendum)
Anesthesia Evaluation  Patient identified by MRN, date of birth, ID band Patient awake    Reviewed: Allergy & Precautions, NPO status , Patient's Chart, lab work & pertinent test results, reviewed documented beta blocker date and time   Airway Mallampati: III     Mouth opening: Limited Mouth Opening  Dental  (+) Teeth Intact, Dental Advisory Given   Pulmonary former smoker,    breath sounds clear to auscultation       Cardiovascular hypertension, Pt. on medications and Pt. on home beta blockers  Rhythm:Regular Rate:Normal     Neuro/Psych PSYCHIATRIC DISORDERS negative neurological ROS     GI/Hepatic negative GI ROS, Neg liver ROS,   Endo/Other  Hypothyroidism   Renal/GU Renal disease  negative genitourinary   Musculoskeletal negative musculoskeletal ROS (+)   Abdominal Normal abdominal exam  (+)   Peds negative pediatric ROS (+)  Hematology negative hematology ROS (+)   Anesthesia Other Findings   Reproductive/Obstetrics negative OB ROS                            Lab Results  Component Value Date   WBC 9.1 08/01/2016   HGB 13.2 08/01/2016   HCT 39.0 08/01/2016   MCV 90.9 08/01/2016   PLT 263 08/01/2016   Lab Results  Component Value Date   CREATININE 1.39 (H) 08/01/2016   BUN 18 08/01/2016   NA 141 08/01/2016   K 4.1 08/01/2016   CL 104 08/01/2016   CO2 28 08/01/2016   Lab Results  Component Value Date   INR 1.05 01/25/2015   INR 1.0 02/29/2008   01/2015 EKG: normal sinus rhythm.  01/2015 Echo - Left ventricle: The cavity size was normal. Wall thickness was   increased in a pattern of moderate LVH. Systolic function was   vigorous. The estimated ejection fraction was in the range of 65%   to 70%. Wall motion was normal; there were no regional wall   motion abnormalities. Features are consistent with a pseudonormal   left ventricular filling pattern, with concomitant  abnormal   relaxation and increased filling pressure (grade 2 diastolic   dysfunction). - Right atrium: The atrium was mildly dilated.  Anesthesia Physical Anesthesia Plan  ASA: II  Anesthesia Plan: General   Post-op Pain Management:    Induction: Intravenous  Airway Management Planned: LMA  Additional Equipment:   Intra-op Plan:   Post-operative Plan: Extubation in OR  Informed Consent: I have reviewed the patients History and Physical, chart, labs and discussed the procedure including the risks, benefits and alternatives for the proposed anesthesia with the patient or authorized representative who has indicated his/her understanding and acceptance.   Dental advisory given  Plan Discussed with: CRNA  Anesthesia Plan Comments: (Possible fiberoptic intubation if OETT.   After induction, very limited mouth opening, recommend fiberoptic intubation in the future if OETT indicated. )      Anesthesia Quick Evaluation

## 2016-08-05 NOTE — Transfer of Care (Signed)
Immediate Anesthesia Transfer of Care Note  Patient: Ricardo Johnson  Procedure(s) Performed: Procedure(s): CYSTOSCOPY, URETHROSCOPY (N/A)  Patient Location: PACU  Anesthesia Type:General  Level of Consciousness: awake, alert , oriented and patient cooperative  Airway & Oxygen Therapy: Patient Spontanous Breathing and Patient connected to face mask oxygen  Post-op Assessment: Report given to RN, Post -op Vital signs reviewed and stable and Patient moving all extremities  Post vital signs: Reviewed and stable  Last Vitals:  Vitals:   08/05/16 0840  BP: (!) 151/87  Pulse: 86  Resp: 18  Temp: 36.6 C    Last Pain:  Vitals:   08/05/16 0840  TempSrc: Oral      Patients Stated Pain Goal: 3 ([id number])  Complications: No apparent anesthesia complications

## 2016-08-05 NOTE — Anesthesia Procedure Notes (Signed)
Procedure Name: LMA Insertion Date/Time: 08/05/2016 11:38 AM Performed by: Cynda Familia Pre-anesthesia Checklist: Patient identified, Emergency Drugs available, Suction available and Patient being monitored Patient Re-evaluated:Patient Re-evaluated prior to inductionOxygen Delivery Method: Circle System Utilized Preoxygenation: Pre-oxygenation with 100% oxygen Intubation Type: IV induction Ventilation: Mask ventilation without difficulty LMA: LMA inserted LMA Size: 4.0 Tube type: Oral Number of attempts: 1 Placement Confirmation: positive ETCO2 Tube secured with: Tape Dental Injury: Teeth and Oropharynx as per pre-operative assessment  Comments: Smooth IV induction by Smith Robert--  LMA CRNA atraumatic--- teeth and mouth as preop --- chipped teeth esp front --- VERY VERY LIMITED MOUTH OPENING--- Glidescope will not fit in mouth- in future may consider Fiberoptic intubation--- bilat BS Hollis--- Hollis to follow up post op

## 2016-08-05 NOTE — Discharge Instructions (Signed)
1. You may see some blood in the urine and may have some burning with urination for 48-72 hours. You also may notice that you have to urinate more frequently or urgently after your procedure which is normal.  °2. You should call should you develop an inability urinate, fever > 101, persistent nausea and vomiting that prevents you from eating or drinking to stay hydrated.  °

## 2016-08-05 NOTE — Anesthesia Postprocedure Evaluation (Addendum)
Anesthesia Post Note  Patient: Ricardo Johnson  Procedure(s) Performed: Procedure(s) (LRB): CYSTOSCOPY, URETHROSCOPY (N/A)  Patient location during evaluation: PACU Anesthesia Type: General Level of consciousness: awake Pain management: pain level controlled Vital Signs Assessment: post-procedure vital signs reviewed and stable Respiratory status: spontaneous breathing Cardiovascular status: stable Postop Assessment: no signs of nausea or vomiting Anesthetic complications: no        Last Vitals:  Vitals:   08/05/16 1315 08/05/16 1345  BP: (!) 181/96 (!) 201/87  Pulse: 87 73  Resp: 17 19  Temp:      Last Pain:  Vitals:   08/05/16 1345  TempSrc:   PainSc: 8    Pain Goal: Patients Stated Pain Goal: 3 (08/05/16 1018)               [place]

## 2016-08-05 NOTE — Op Note (Signed)
Preoperative diagnosis:  1. BPH/LUTS  Postoperative diagnosis: 1. BPH/LUTS  Procedure(s): 1. Cystourethroscopy  Surgeon: Dr. Roxy Horseman, Jr  Anesthesia: General  Complications: None  EBL: Minimal  Specimens: None  Intraoperative findings: Ricardo Johnson was noted to have an obliterated prostatic urethra with dystrophic calcification and necrotic tissue without any clear lumen noted into the bladder.  Indication: Ricardo Johnson is a 71 year old gentleman s/p brachytherapy for prostate cancer in 2005.  Ricardo Johnson presented with significant worsening of his LUTS over the past 6-12 months.  Evaluation revealed obstruction of the bladder on urodynamics and cystoscopy in the office demonstrated an abnormal prostatic urethra without the ability to access the bladder.  I recommended further evaluation in the OR with cystoscopy and resection of obstructing tissue at his bladder neck if indicated.  I discussed the potential benefits and risks of the procedure, side effects of the proposed treatment, the likelihood of the patient achieving the goals of the procedure, and any potential problems that might occur during the procedure or recuperation. Ricardo Johnson gave his informed consent to proceed.  Description of procedure:  Ricardo Johnson was taken to the OR and given preoperative antibiotics.  Ricardo Johnson was placed in the dorsal lithotomy position and prepped and draped in the usual sterile fashion.  A preoperative time out was performed.  It was noted by anesthesia that Ricardo Johnson was a difficult airway due to an inability to open his jaw very wide from a prior trauma injury to his jaw.  However, an LMA was able to be placed.   Cystourethroscopy was then performed with a 22 Fr cystoscope.  The anterior urethra was normal appearing.  The verumontanum was identified and inspection of the prostatic urethra was very abnormal.  Visualization was quite poor initially.  I then placed the 26 Fr resectoscope with continuous flow irrigation and switch the fluid  to water.  This allowed improved visualization.  The prostatic urethra was obliterated with no true lumen identified.  There was a large amount of yellow, necrotic tissue and dystrophic calcification in the prostatic urethra.  I spent about 25-30 minutes looking for a lumen and tried passing a guidewire to access the bladder but was unsuccessful.  After an extensive search for the bladder neck, I stopped the procedure not wanting to cause any worse problems that already existed.  I was not able to safely place a suprapubic tube percutaneously due to his prior abdominal surgery.  Ricardo Johnson was awakened and transferred to the PACU.  Ricardo Johnson tolerated the procedure well and without complications.  I called Interventional Radiology and the patient has been scheduled for elective SP tube placement under radiologic guidance considering his prior abdominal surgery.  I will then plan to take him back to the OR with further radiology evaluation (retrograde and antegrade contrast studies) and methylene blue instillation into the bladder that may assist with accessing the bladder transurethrally.

## 2016-08-05 NOTE — Interval H&P Note (Signed)
History and Physical Interval Note:  08/05/2016 10:44 AM  Ricardo Johnson  has presented today for surgery, with the diagnosis of BLADDER OUTLET OBSTRUCTION  The various methods of treatment have been discussed with the patient and family. After consideration of risks, benefits and other options for treatment, the patient has consented to  Procedure(s): TRANSURETHRAL RESECTION OF THE PROSTATE (TURP) (N/A) CYSTOSCOPY (N/A) as a surgical intervention .  The patient's history has been reviewed, patient examined, no change in status, stable for surgery.  I have reviewed the patient's chart and labs.  Questions were answered to the patient's satisfaction.     Emma Schupp,LES

## 2016-08-06 ENCOUNTER — Encounter (HOSPITAL_COMMUNITY): Payer: Self-pay | Admitting: Urology

## 2016-08-06 ENCOUNTER — Other Ambulatory Visit (HOSPITAL_COMMUNITY): Payer: Self-pay | Admitting: Urology

## 2016-08-06 DIAGNOSIS — N401 Enlarged prostate with lower urinary tract symptoms: Principal | ICD-10-CM

## 2016-08-06 DIAGNOSIS — N138 Other obstructive and reflux uropathy: Secondary | ICD-10-CM

## 2016-08-07 ENCOUNTER — Emergency Department (HOSPITAL_COMMUNITY): Payer: Medicare HMO

## 2016-08-07 ENCOUNTER — Inpatient Hospital Stay (HOSPITAL_COMMUNITY)
Admission: EM | Admit: 2016-08-07 | Discharge: 2016-08-10 | DRG: 690 | Disposition: A | Discharge status: Home / Self Care | Payer: Medicare HMO | Attending: Internal Medicine | Admitting: Internal Medicine

## 2016-08-07 ENCOUNTER — Encounter (HOSPITAL_COMMUNITY): Payer: Self-pay | Admitting: Emergency Medicine

## 2016-08-07 DIAGNOSIS — A419 Sepsis, unspecified organism: Secondary | ICD-10-CM

## 2016-08-07 DIAGNOSIS — R531 Weakness: Secondary | ICD-10-CM | POA: Diagnosis not present

## 2016-08-07 DIAGNOSIS — R3914 Feeling of incomplete bladder emptying: Secondary | ICD-10-CM | POA: Diagnosis not present

## 2016-08-07 DIAGNOSIS — N401 Enlarged prostate with lower urinary tract symptoms: Secondary | ICD-10-CM | POA: Diagnosis present

## 2016-08-07 DIAGNOSIS — R404 Transient alteration of awareness: Secondary | ICD-10-CM | POA: Diagnosis not present

## 2016-08-07 DIAGNOSIS — Z9049 Acquired absence of other specified parts of digestive tract: Secondary | ICD-10-CM

## 2016-08-07 DIAGNOSIS — N390 Urinary tract infection, site not specified: Secondary | ICD-10-CM | POA: Diagnosis present

## 2016-08-07 DIAGNOSIS — I10 Essential (primary) hypertension: Secondary | ICD-10-CM | POA: Diagnosis present

## 2016-08-07 DIAGNOSIS — W19XXXA Unspecified fall, initial encounter: Secondary | ICD-10-CM

## 2016-08-07 DIAGNOSIS — R269 Unspecified abnormalities of gait and mobility: Secondary | ICD-10-CM | POA: Diagnosis not present

## 2016-08-07 DIAGNOSIS — E039 Hypothyroidism, unspecified: Secondary | ICD-10-CM | POA: Diagnosis present

## 2016-08-07 DIAGNOSIS — S0990XA Unspecified injury of head, initial encounter: Secondary | ICD-10-CM | POA: Diagnosis not present

## 2016-08-07 DIAGNOSIS — C61 Malignant neoplasm of prostate: Secondary | ICD-10-CM | POA: Diagnosis present

## 2016-08-07 DIAGNOSIS — F10129 Alcohol abuse with intoxication, unspecified: Secondary | ICD-10-CM | POA: Diagnosis not present

## 2016-08-07 DIAGNOSIS — R338 Other retention of urine: Secondary | ICD-10-CM | POA: Diagnosis not present

## 2016-08-07 DIAGNOSIS — N179 Acute kidney failure, unspecified: Secondary | ICD-10-CM | POA: Diagnosis present

## 2016-08-07 DIAGNOSIS — Z8546 Personal history of malignant neoplasm of prostate: Secondary | ICD-10-CM | POA: Diagnosis not present

## 2016-08-07 DIAGNOSIS — N3001 Acute cystitis with hematuria: Secondary | ICD-10-CM | POA: Diagnosis not present

## 2016-08-07 DIAGNOSIS — Z9889 Other specified postprocedural states: Secondary | ICD-10-CM | POA: Diagnosis not present

## 2016-08-07 DIAGNOSIS — N320 Bladder-neck obstruction: Secondary | ICD-10-CM | POA: Diagnosis present

## 2016-08-07 DIAGNOSIS — Z87442 Personal history of urinary calculi: Secondary | ICD-10-CM | POA: Diagnosis not present

## 2016-08-07 DIAGNOSIS — Z79899 Other long term (current) drug therapy: Secondary | ICD-10-CM | POA: Diagnosis not present

## 2016-08-07 DIAGNOSIS — S199XXA Unspecified injury of neck, initial encounter: Secondary | ICD-10-CM | POA: Diagnosis not present

## 2016-08-07 DIAGNOSIS — R509 Fever, unspecified: Secondary | ICD-10-CM | POA: Diagnosis not present

## 2016-08-07 DIAGNOSIS — D649 Anemia, unspecified: Secondary | ICD-10-CM | POA: Diagnosis present

## 2016-08-07 DIAGNOSIS — R339 Retention of urine, unspecified: Secondary | ICD-10-CM | POA: Diagnosis present

## 2016-08-07 DIAGNOSIS — R739 Hyperglycemia, unspecified: Secondary | ICD-10-CM

## 2016-08-07 DIAGNOSIS — N3000 Acute cystitis without hematuria: Secondary | ICD-10-CM | POA: Diagnosis not present

## 2016-08-07 DIAGNOSIS — E876 Hypokalemia: Secondary | ICD-10-CM | POA: Diagnosis present

## 2016-08-07 DIAGNOSIS — Z8249 Family history of ischemic heart disease and other diseases of the circulatory system: Secondary | ICD-10-CM

## 2016-08-07 DIAGNOSIS — Z923 Personal history of irradiation: Secondary | ICD-10-CM | POA: Diagnosis not present

## 2016-08-07 DIAGNOSIS — F10920 Alcohol use, unspecified with intoxication, uncomplicated: Secondary | ICD-10-CM

## 2016-08-07 DIAGNOSIS — E119 Type 2 diabetes mellitus without complications: Secondary | ICD-10-CM

## 2016-08-07 DIAGNOSIS — Z87891 Personal history of nicotine dependence: Secondary | ICD-10-CM | POA: Diagnosis not present

## 2016-08-07 DIAGNOSIS — R55 Syncope and collapse: Secondary | ICD-10-CM | POA: Diagnosis present

## 2016-08-07 DIAGNOSIS — E1165 Type 2 diabetes mellitus with hyperglycemia: Secondary | ICD-10-CM | POA: Diagnosis present

## 2016-08-07 DIAGNOSIS — R110 Nausea: Secondary | ICD-10-CM | POA: Diagnosis not present

## 2016-08-07 LAB — CBC WITH DIFFERENTIAL/PLATELET
BASOS ABS: 0 10*3/uL (ref 0.0–0.1)
BASOS PCT: 0 %
EOS ABS: 0 10*3/uL (ref 0.0–0.7)
Eosinophils Relative: 0 %
HCT: 34.5 % — ABNORMAL LOW (ref 39.0–52.0)
HEMOGLOBIN: 11.8 g/dL — AB (ref 13.0–17.0)
Lymphocytes Relative: 9 %
Lymphs Abs: 1.3 10*3/uL (ref 0.7–4.0)
MCH: 31 pg (ref 26.0–34.0)
MCHC: 34.2 g/dL (ref 30.0–36.0)
MCV: 90.6 fL (ref 78.0–100.0)
MONOS PCT: 7 %
Monocytes Absolute: 1 10*3/uL (ref 0.1–1.0)
NEUTROS PCT: 84 %
Neutro Abs: 12.4 10*3/uL — ABNORMAL HIGH (ref 1.7–7.7)
Platelets: 202 10*3/uL (ref 150–400)
RBC: 3.81 MIL/uL — AB (ref 4.22–5.81)
RDW: 13.9 % (ref 11.5–15.5)
WBC: 14.7 10*3/uL — AB (ref 4.0–10.5)

## 2016-08-07 LAB — INFLUENZA PANEL BY PCR (TYPE A & B)
INFLAPCR: NEGATIVE
INFLBPCR: NEGATIVE

## 2016-08-07 LAB — CREATININE, SERUM
Creatinine, Ser: 1.52 mg/dL — ABNORMAL HIGH (ref 0.61–1.24)
GFR calc non Af Amer: 44 mL/min — ABNORMAL LOW (ref >60)
GFR, EST AFRICAN AMERICAN: 51 mL/min — AB (ref >60)

## 2016-08-07 LAB — BASIC METABOLIC PANEL
ANION GAP: 9 (ref 5–15)
BUN: 19 mg/dL (ref 6–20)
CHLORIDE: 99 mmol/L — AB (ref 101–111)
CO2: 28 mmol/L (ref 22–32)
CREATININE: 1.71 mg/dL — AB (ref 0.61–1.24)
Calcium: 8.4 mg/dL — ABNORMAL LOW (ref 8.9–10.3)
GFR calc non Af Amer: 38 mL/min — ABNORMAL LOW (ref >60)
GFR, EST AFRICAN AMERICAN: 44 mL/min — AB (ref >60)
Glucose, Bld: 160 mg/dL — ABNORMAL HIGH (ref 65–99)
Potassium: 3.2 mmol/L — ABNORMAL LOW (ref 3.5–5.1)
SODIUM: 136 mmol/L (ref 135–145)

## 2016-08-07 LAB — URINALYSIS, ROUTINE W REFLEX MICROSCOPIC
BILIRUBIN URINE: NEGATIVE
Glucose, UA: NEGATIVE mg/dL
Ketones, ur: NEGATIVE mg/dL
Nitrite: NEGATIVE
PH: 5 (ref 5.0–8.0)
Protein, ur: 100 mg/dL — AB
Specific Gravity, Urine: 1.019 (ref 1.005–1.030)

## 2016-08-07 LAB — TROPONIN I: Troponin I: <0.03 ng/mL (ref <0.03)

## 2016-08-07 LAB — I-STAT CG4 LACTIC ACID, ED: Lactic Acid, Venous: 1.58 mmol/L (ref 0.5–1.9)

## 2016-08-07 MED ORDER — ACETAMINOPHEN 650 MG RE SUPP: 650.0000 mg | Freq: Four times a day (QID) | RECTAL | Status: DC | PRN

## 2016-08-07 MED ORDER — SODIUM CHLORIDE 0.9% FLUSH
3.0000 mL | Freq: Two times a day (BID) | INTRAVENOUS | Status: DC
  Administered 2016-08-07 – 2016-08-10 (×3): 3 mL via INTRAVENOUS

## 2016-08-07 MED ORDER — TAMSULOSIN HCL 0.4 MG PO CAPS
0.4000 mg | ORAL_CAPSULE | Freq: Every day | ORAL | Status: DC
  Administered 2016-08-08 – 2016-08-10 (×3): 0.4 mg via ORAL
  Filled 2016-08-07 (×3): qty 1

## 2016-08-07 MED ORDER — AMLODIPINE BESYLATE 10 MG PO TABS
10.0000 mg | ORAL_TABLET | Freq: Every day | ORAL | Status: DC
  Administered 2016-08-07 – 2016-08-09 (×3): 10 mg via ORAL
  Filled 2016-08-07 (×3): qty 1

## 2016-08-07 MED ORDER — SODIUM CHLORIDE 0.9 % IV BOLUS (SEPSIS)
2000.0000 mL | Freq: Once | INTRAVENOUS | Status: AC
  Administered 2016-08-07: 2000 mL via INTRAVENOUS

## 2016-08-07 MED ORDER — ACETAMINOPHEN 325 MG PO TABS
650.0000 mg | ORAL_TABLET | Freq: Four times a day (QID) | ORAL | Status: DC | PRN
  Administered 2016-08-08 – 2016-08-10 (×5): 650 mg via ORAL
  Filled 2016-08-07 (×5): qty 2

## 2016-08-07 MED ORDER — LEVOTHYROXINE SODIUM 100 MCG PO TABS
100.0000 ug | ORAL_TABLET | Freq: Every day | ORAL | Status: DC
  Administered 2016-08-08 – 2016-08-10 (×3): 100 ug via ORAL
  Filled 2016-08-07 (×3): qty 1

## 2016-08-07 MED ORDER — DEXTROSE 5 % IV SOLN
1.0000 g | INTRAVENOUS | Status: DC
  Administered 2016-08-08 – 2016-08-09 (×2): 1 g via INTRAVENOUS
  Filled 2016-08-07 (×2): qty 10

## 2016-08-07 MED ORDER — METOPROLOL SUCCINATE ER 100 MG PO TB24
100.0000 mg | ORAL_TABLET | Freq: Every day | ORAL | Status: DC
  Administered 2016-08-08 – 2016-08-10 (×3): 100 mg via ORAL
  Filled 2016-08-07 (×3): qty 1

## 2016-08-07 MED ORDER — POTASSIUM CHLORIDE IN NACL 20-0.9 MEQ/L-% IV SOLN
INTRAVENOUS | Status: DC
  Administered 2016-08-07: 21:00:00 via INTRAVENOUS
  Filled 2016-08-07 (×2): qty 1000

## 2016-08-07 MED ORDER — FINASTERIDE 5 MG PO TABS
5.0000 mg | ORAL_TABLET | Freq: Every day | ORAL | Status: DC
  Administered 2016-08-08 – 2016-08-10 (×3): 5 mg via ORAL
  Filled 2016-08-07 (×3): qty 1

## 2016-08-07 MED ORDER — ACETAMINOPHEN 325 MG PO TABS
650.0000 mg | ORAL_TABLET | Freq: Once | ORAL | Status: AC
  Administered 2016-08-07: 650 mg via ORAL
  Filled 2016-08-07: qty 2

## 2016-08-07 MED ORDER — DEXTROSE 5 % IV SOLN
1.0000 g | Freq: Once | INTRAVENOUS | Status: AC
  Administered 2016-08-07: 1 g via INTRAVENOUS
  Filled 2016-08-07: qty 10
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
## 2016-08-07 NOTE — ED Triage Notes (Signed)
Pt states that he drank a bottle of gin this morning and took his blood pressure medication with it (lisinopril).  Pt states that he was not dizzy at the time of falling but "just fell".  Denies hitting head or LOC, no blood thinners.

## 2016-08-07 NOTE — ED Notes (Addendum)
Unable to obtain second set of blood cultures d/t blood hemolyzing and pt being a hard stick. Pt has been stuck multiple times.

## 2016-08-07 NOTE — ED Notes (Signed)
Bed: [id number] Expected date:  Expected time:  Means of arrival:  Comments: 71 yo m near syncope, fall

## 2016-08-07 NOTE — ED Triage Notes (Signed)
While at a local gas station pt. Felt "weak", at which time bystanders assisted him to the ground. His only complaint upon arrival is of nausea. He states he had a procedure for a "prostate problem" a couple of days ago. Pt. Also smells of ETOH. He is in no distress.
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
## 2016-08-08 ENCOUNTER — Inpatient Hospital Stay (HOSPITAL_COMMUNITY): Payer: Medicare HMO

## 2016-08-08 ENCOUNTER — Encounter (HOSPITAL_COMMUNITY): Payer: Self-pay | Admitting: General Surgery

## 2016-08-08 DIAGNOSIS — R55 Syncope and collapse: Secondary | ICD-10-CM

## 2016-08-08 DIAGNOSIS — N390 Urinary tract infection, site not specified: Principal | ICD-10-CM

## 2016-08-08 DIAGNOSIS — N320 Bladder-neck obstruction: Secondary | ICD-10-CM

## 2016-08-08 DIAGNOSIS — E876 Hypokalemia: Secondary | ICD-10-CM

## 2016-08-08 DIAGNOSIS — N179 Acute kidney failure, unspecified: Secondary | ICD-10-CM

## 2016-08-08 LAB — GASTROINTESTINAL PANEL BY PCR, STOOL (REPLACES STOOL CULTURE)

## 2016-08-08 LAB — COMPREHENSIVE METABOLIC PANEL
ALT: 15 U/L — AB (ref 17–63)
AST: 18 U/L (ref 15–41)
Albumin: 3.4 g/dL — ABNORMAL LOW (ref 3.5–5.0)
Alkaline Phosphatase: 53 U/L (ref 38–126)
Anion gap: 9 (ref 5–15)
BILIRUBIN TOTAL: 0.9 mg/dL (ref 0.3–1.2)
BUN: 20 mg/dL (ref 6–20)
CO2: 26 mmol/L (ref 22–32)
Calcium: 8.2 mg/dL — ABNORMAL LOW (ref 8.9–10.3)
Chloride: 105 mmol/L (ref 101–111)
Creatinine, Ser: 1.5 mg/dL — ABNORMAL HIGH (ref 0.61–1.24)
GFR, EST AFRICAN AMERICAN: 52 mL/min — AB (ref >60)
GFR, EST NON AFRICAN AMERICAN: 45 mL/min — AB (ref >60)
Glucose, Bld: 131 mg/dL — ABNORMAL HIGH (ref 65–99)
Potassium: 3.2 mmol/L — ABNORMAL LOW (ref 3.5–5.1)
Sodium: 140 mmol/L (ref 135–145)
TOTAL PROTEIN: 7.6 g/dL (ref 6.5–8.1)

## 2016-08-08 LAB — VAS US CAROTID
LCCADDIAS: -11 cm/s
LCCAPDIAS: 12 cm/s
LCCAPSYS: 77 cm/s
LEFT ECA DIAS: -2 cm/s
LEFT VERTEBRAL DIAS: -10 cm/s
LICADDIAS: -14 cm/s
LICADSYS: -36 cm/s
LICAPDIAS: -15 cm/s
LICAPSYS: -37 cm/s
Left CCA dist sys: -67 cm/s
RIGHT ECA DIAS: -10 cm/s
RIGHT VERTEBRAL DIAS: 18 cm/s
Right CCA prox dias: 7 cm/s
Right CCA prox sys: 64 cm/s
Right cca dist sys: -37 cm/s

## 2016-08-08 LAB — CBC
HCT: 36.8 % — ABNORMAL LOW (ref 39.0–52.0)
Hemoglobin: 12.4 g/dL — ABNORMAL LOW (ref 13.0–17.0)
MCH: 31.2 pg (ref 26.0–34.0)
MCHC: 33.7 g/dL (ref 30.0–36.0)
MCV: 92.5 fL (ref 78.0–100.0)
PLATELETS: 194 10*3/uL (ref 150–400)
RBC: 3.98 MIL/uL — ABNORMAL LOW (ref 4.22–5.81)
RDW: 14.1 % (ref 11.5–15.5)
WBC: 14.3 10*3/uL — AB (ref 4.0–10.5)

## 2016-08-08 LAB — ECHOCARDIOGRAM COMPLETE
Height: 66 in
Weight: 2455.04 oz

## 2016-08-08 LAB — TROPONIN I

## 2016-08-08 LAB — C DIFFICILE QUICK SCREEN W PCR REFLEX
C DIFFICILE (CDIFF) INTERP: NOT DETECTED
C DIFFICILE (CDIFF) TOXIN: NEGATIVE
C Diff antigen: NEGATIVE

## 2016-08-08 LAB — GLUCOSE, CAPILLARY
GLUCOSE-CAPILLARY: 118 mg/dL — AB (ref 65–99)
Glucose-Capillary: 141 mg/dL — ABNORMAL HIGH (ref 65–99)

## 2016-08-08 LAB — PROTIME-INR
INR: 1.18
PROTHROMBIN TIME: 15 s (ref 11.4–15.2)

## 2016-08-08 MED ORDER — POTASSIUM CHLORIDE IN NACL 20-0.9 MEQ/L-% IV SOLN
INTRAVENOUS | Status: DC
  Administered 2016-08-08 – 2016-08-09 (×2): via INTRAVENOUS
  Filled 2016-08-08 (×2): qty 1000

## 2016-08-08 MED ORDER — FENTANYL CITRATE (PF) 100 MCG/2ML IJ SOLN
INTRAMUSCULAR | Status: AC
  Filled 2016-08-08: qty 4

## 2016-08-08 MED ORDER — MIDAZOLAM HCL 2 MG/2ML IJ SOLN
INTRAMUSCULAR | Status: AC
  Filled 2016-08-08: qty 6

## 2016-08-08 MED ORDER — VITAMIN B-1 100 MG PO TABS
100.0000 mg | ORAL_TABLET | Freq: Every day | ORAL | Status: DC
  Administered 2016-08-08 – 2016-08-10 (×3): 100 mg via ORAL
  Filled 2016-08-08 (×3): qty 1

## 2016-08-08 MED ORDER — MIDAZOLAM HCL 2 MG/2ML IJ SOLN
INTRAMUSCULAR | Status: AC | PRN
  Administered 2016-08-08: 0.5 mg via INTRAVENOUS
  Administered 2016-08-08 (×2): 1 mg via INTRAVENOUS
  Administered 2016-08-08: 1.5 mg via INTRAVENOUS

## 2016-08-08 MED ORDER — LORAZEPAM 1 MG PO TABS: 1.0000 mg | ORAL_TABLET | Freq: Four times a day (QID) | ORAL | Status: DC | PRN

## 2016-08-08 MED ORDER — INSULIN ASPART 100 UNIT/ML ~~LOC~~ SOLN
0.0000 [IU] | Freq: Three times a day (TID) | SUBCUTANEOUS | Status: DC
  Administered 2016-08-09: 1 [IU] via SUBCUTANEOUS
  Administered 2016-08-10: 2 [IU] via SUBCUTANEOUS

## 2016-08-08 MED ORDER — LORAZEPAM 2 MG/ML IJ SOLN: 1.0000 mg | Freq: Four times a day (QID) | INTRAMUSCULAR | Status: DC | PRN

## 2016-08-08 MED ORDER — FOLIC ACID 1 MG PO TABS
1.0000 mg | ORAL_TABLET | Freq: Every day | ORAL | Status: DC
  Administered 2016-08-08 – 2016-08-10 (×3): 1 mg via ORAL
  Filled 2016-08-08 (×3): qty 1

## 2016-08-08 MED ORDER — ADULT MULTIVITAMIN W/MINERALS CH
1.0000 | ORAL_TABLET | Freq: Every day | ORAL | Status: DC
  Administered 2016-08-08 – 2016-08-10 (×3): 1 via ORAL
  Filled 2016-08-08 (×3): qty 1

## 2016-08-08 MED ORDER — ONDANSETRON HCL 4 MG/2ML IJ SOLN
4.0000 mg | Freq: Four times a day (QID) | INTRAMUSCULAR | Status: DC | PRN
  Administered 2016-08-08 – 2016-08-09 (×4): 4 mg via INTRAVENOUS
  Filled 2016-08-08 (×4): qty 2

## 2016-08-08 MED ORDER — POTASSIUM CHLORIDE CRYS ER 20 MEQ PO TBCR
40.0000 meq | EXTENDED_RELEASE_TABLET | Freq: Once | ORAL | Status: AC
  Administered 2016-08-08: 40 meq via ORAL
  Filled 2016-08-08: qty 2

## 2016-08-08 MED ORDER — FENTANYL CITRATE (PF) 100 MCG/2ML IJ SOLN
INTRAMUSCULAR | Status: AC | PRN
  Administered 2016-08-08 (×3): 50 ug via INTRAVENOUS

## 2016-08-08 MED ORDER — THIAMINE HCL 100 MG/ML IJ SOLN: 100.0000 mg | Freq: Every day | INTRAMUSCULAR | Status: DC

## 2016-08-08 NOTE — Procedures (Signed)
Successful placement of suprapubic bladder catheter with CT guidance.  12 Fr drain placed.  Minimal blood loss.  Immediately removed 250 ml of yellow urine.  No immediate complication.

## 2016-08-08 NOTE — Sedation Documentation (Signed)
Pt not comfortable but tolerating procedure well.

## 2016-08-08 NOTE — Sedation Documentation (Signed)
Patient is resting comfortably. 

## 2016-08-08 NOTE — Sedation Documentation (Signed)
Vital signs stable.  Per MD, Anselm Pancoast. Monitor patient 30 mins before transporting to the floor.
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
## 2016-08-08 NOTE — Progress Notes (Signed)
  Echocardiogram 2D Echocardiogram has been performed.  Ricardo Johnson 08/08/2016, 2:55 PM

## 2016-08-08 NOTE — Care Management Note (Signed)
Case Management Note  Patient Details  Name: Ricardo Johnson MRN: [id number] Date of Birth: [birth year]  Subjective/Objective:  Pt admitted with UTI, Prostate CA, AKI                   Action/Plan: Plan to discharge home   Expected Discharge Date:  08/10/16               Expected Discharge Plan:  Home/Self Care  In-House Referral:  Clinical Social Work, NA  Discharge planning Services  CM Consult  Post Acute Care Choice:    Choice offered to:     DME Arranged:    DME Agency:     HH Arranged:    [place] Agency:     Status of Service:  In process, will continue to follow  If discussed at Long Length of Stay Meetings, dates discussed:    Additional CommentsPurcell Mouton, RN 08/08/2016, 12:03 PM

## 2016-08-08 NOTE — Progress Notes (Signed)
VASCULAR LAB PRELIMINARY  PRELIMINARY  PRELIMINARY  PRELIMINARY  Carotid duplex completed.    Preliminary report:  Bilateral:  1-39% ICA stenosis.  Vertebral artery flow is antegrade.     Ricardo Johnson, RVS 08/08/2016, 3:45 PM

## 2016-08-08 NOTE — Sedation Documentation (Signed)
Pt agitated with insertion. x2 persons assisting in care.

## 2016-08-09 DIAGNOSIS — N3000 Acute cystitis without hematuria: Secondary | ICD-10-CM

## 2016-08-09 LAB — CBC
HEMATOCRIT: 34.7 % — AB (ref 39.0–52.0)
Hemoglobin: 11.3 g/dL — ABNORMAL LOW (ref 13.0–17.0)
MCH: 30.3 pg (ref 26.0–34.0)
MCHC: 32.6 g/dL (ref 30.0–36.0)
MCV: 93 fL (ref 78.0–100.0)
PLATELETS: 205 10*3/uL (ref 150–400)
RBC: 3.73 MIL/uL — ABNORMAL LOW (ref 4.22–5.81)
RDW: 14.3 % (ref 11.5–15.5)
WBC: 11.7 10*3/uL — ABNORMAL HIGH (ref 4.0–10.5)

## 2016-08-09 LAB — BASIC METABOLIC PANEL
Anion gap: 9 (ref 5–15)
BUN: 14 mg/dL (ref 6–20)
CALCIUM: 7.7 mg/dL — AB (ref 8.9–10.3)
CO2: 23 mmol/L (ref 22–32)
CREATININE: 1.14 mg/dL (ref 0.61–1.24)
Chloride: 107 mmol/L (ref 101–111)
GFR calc non Af Amer: >60 mL/min (ref >60)
GLUCOSE: 131 mg/dL — AB (ref 65–99)
Potassium: 3.5 mmol/L (ref 3.5–5.1)
Sodium: 139 mmol/L (ref 135–145)

## 2016-08-09 LAB — GLUCOSE, CAPILLARY
GLUCOSE-CAPILLARY: 98 mg/dL (ref 65–99)
Glucose-Capillary: 123 mg/dL — ABNORMAL HIGH (ref 65–99)
Glucose-Capillary: 118 mg/dL — ABNORMAL HIGH (ref 65–99)
Glucose-Capillary: 115 mg/dL — ABNORMAL HIGH (ref 65–99)

## 2016-08-09 LAB — HEMOGLOBIN A1C
Hgb A1c MFr Bld: 6.5 % — ABNORMAL HIGH (ref 4.8–5.6)
MEAN PLASMA GLUCOSE: 140

## 2016-08-09 LAB — URINE CULTURE: Culture: >=100000 — AB

## 2016-08-09 MED ORDER — POTASSIUM CHLORIDE CRYS ER 20 MEQ PO TBCR
40.0000 meq | EXTENDED_RELEASE_TABLET | Freq: Once | ORAL | Status: AC
  Administered 2016-08-09: 40 meq via ORAL
  Filled 2016-08-09: qty 2
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
## 2016-08-09 NOTE — Progress Notes (Signed)
Pt's daughter at bedside concerned about pt going home with foley cath care. Explained to pt's daughter pt will not need to change foley, pt will go for follow up with MD, also pt's nurse will teach pt foley care. HHRN order discontinued.

## 2016-08-09 NOTE — Evaluation (Signed)
Physical Therapy Evaluation Patient Details Name: Ricardo Johnson MRN: [id number] DOB: 1946/03/17 Today's Date: 08/09/2016   History of Present Illness  71 year old male with PMH of prostate cancer and LUTS status post radiation seed implantation January 2005, developed severe bladder outlet obstruction and taken to OR by Dr. Alinda Money on 08/05/16 when cystoscopy showed obliterated prostatic urethra without any clear lumen noted into the bladder and inability to place a Foley at that time, presented to the ED after a fall following heavy alcohol use and found to have fever of 101.42F, leukocytosis, elevated creatinine and urine microscopy suggestive of UTI. He has history of alcohol abuse, kidney stones, HTN and tobacco abuse. Admitted for complicated UTI, bladder outlet obstruction. Suprapubic catheter placed 08/08/16.  Clinical Impression  Pt admitted with above diagnosis. Pt currently with functional limitations due to the deficits listed below (see PT Problem List). Pt ambulated 130' holding IV pole, all mobility somewhat guarded 2* 8/10 pain at suprapubic catheter site, pain meds requested. Good progress expected as pain diminishes.  Pt will benefit from skilled PT to increase their independence and safety with mobility to allow discharge to the venue listed below.       Follow Up Recommendations No PT follow up    Equipment Recommendations  Cane    Recommendations for Other Services       Precautions / Restrictions Precautions Precautions: Fall Precaution Comments: fell day of admission after heavy drinking, denies other falls in past year Restrictions Weight Bearing Restrictions: No      Mobility  Bed Mobility Overal bed mobility: Modified Independent             General bed mobility comments: HOB up, used rail  Transfers Overall transfer level: Needs assistance Equipment used: None Transfers: Sit to/from Stand Sit to Stand: Min guard         General transfer  comment: increased time 2* pain, supervision for safety  Ambulation/Gait Ambulation/Gait assistance: Supervision Ambulation Distance (Feet): 130 Feet Assistive device:  (held IV pole with RUE) Gait Pattern/deviations: Step-through pattern;Decreased stride length;Trunk flexed   Gait velocity interpretation: Below normal speed for age/gender General Gait Details: steady, no LOB, trunk mildly flexed 2* pain from suprapubic catheter  Stairs            Wheelchair Mobility    Modified Rankin (Stroke Patients Only)       Balance Overall balance assessment: History of Falls;Needs assistance   Sitting balance-Leahy Scale: Good     Standing balance support: Single extremity supported Standing balance-Leahy Scale: Good Standing balance comment: steady with single UE support                             Pertinent Vitals/Pain Pain Assessment: 0-10 Pain Score: 8  Pain Location: suprapubic catheter site Pain Descriptors / Indicators: Sore Pain Intervention(s): Limited activity within patient's tolerance;Monitored during session;Premedicated before session;Patient requesting pain meds-RN notified    Home Living Family/patient expects to be discharged to:: Private residence Living Arrangements: Alone Available Help at Discharge: Family;Available PRN/intermittently   Home Access: Level entry     Home Layout: One level Home Equipment: Walker - 2 wheels Additional Comments: daughter can stop by daily, she works    Prior Function Level of Independence: Independent         Comments: walks without AD, independent ADLs     Hand Dominance        Extremity/Trunk Assessment   Upper Extremity Assessment  Upper Extremity Assessment: Overall WFL for tasks assessed    Lower Extremity Assessment Lower Extremity Assessment: Overall WFL for tasks assessed (knee ext 4/5 B)    Cervical / Trunk Assessment Cervical / Trunk Assessment: Normal  Communication    Communication: No difficulties  Cognition Arousal/Alertness: Awake/alert Behavior During Therapy: WFL for tasks assessed/performed Overall Cognitive Status: Within Functional Limits for tasks assessed                      General Comments      Exercises     Assessment/Plan    PT Assessment Patient needs continued PT services  PT Problem List Decreased balance;Decreased mobility;Pain       PT Treatment Interventions DME instruction;Gait training;Functional mobility training;Therapeutic exercise;Balance training;Therapeutic activities;Patient/family education    PT Goals (Current goals can be found in the Care Plan section)  Acute Rehab PT Goals Patient Stated Goal: return home where he likes to "lay low" PT Goal Formulation: With patient Time For Goal Achievement: 08/23/16 Potential to Achieve Goals: Good    Frequency Min 3X/week   Barriers to discharge        Co-evaluation               End of Session Equipment Utilized During Treatment: Gait belt Activity Tolerance: Patient limited by pain Patient left: in chair;with call bell/phone within reach;with chair alarm set Nurse Communication: Mobility status PT Visit Diagnosis: Pain;History of falling (Z91.81);Difficulty in walking, not elsewhere classified (R26.2) Pain - part of body:  (suprapubic catheter site)         Time: [id number] PT Time Calculation (min) (ACUTE ONLY): 20 min   Charges:   PT Evaluation $PT Eval Low Complexity: 1 Procedure     PT G Codes:         Philomena Doheny 08/09/2016, 11:15 AM [phone]

## 2016-08-09 NOTE — Progress Notes (Signed)
Patient ID: Ricardo Johnson, male   DOB: [birth year], 71 y.o.   MRN: [id number]    Subjective: Pt doing well.  No new complaints.  No fever overnight.  Objective: Vital signs in last 24 hours: Temp:  [97.8 F (36.6 C)-98.8 F (37.1 C)] 98.8 F (37.1 C) (02/23 0530) Pulse Rate:  [77-94] 81 (02/23 0530) Resp:  [14-25] 18 (02/23 0530) BP: (125-169)/(50-101) 131/85 (02/23 0530) SpO2:  [91 %-100 %] 96 % (02/23 0530)  Intake/Output from previous day: 02/22 0701 - 02/23 0700 In: 3233 [P.O.:708; I.V.:2400; IV Piggyback:50] Out: 975 [Urine:350; Drains:625] Intake/Output this shift: No intake/output data recorded.  Physical Exam:  General: Alert and oriented Abd: SP tube in place draining clear urine.  Lab Results:  Recent Labs  08/07/16 1531 08/08/16 0213 08/09/16 0505  HGB 11.8* 12.4* 11.3*  HCT 34.5* 36.8* 34.7*   CBC Latest Ref Rng & Units 08/09/2016 08/08/2016 08/07/2016  WBC 4.0 - 10.5 K/uL 11.7(H) 14.3(H) 14.7(H)  Hemoglobin 13.0 - 17.0 g/dL 11.3(L) 12.4(L) 11.8(L)  Hematocrit 39.0 - 52.0 % 34.7(L) 36.8(L) 34.5(L)  Platelets 150 - 400 K/uL 205 194 202     BMET  Recent Labs  08/08/16 0213 08/09/16 0505  NA 140 139  K 3.2* 3.5  CL 105 107  CO2 26 23  GLUCOSE 131* 131*  BUN 20 14  CREATININE 1.50* 1.14  CALCIUM 8.2* 7.7*     Studies/Results: Cultures pending.  Assessment/Plan: - Culture specific antibiotics for one week.  Continue SP tube.  He has outpatient follow up with me next Friday, 3/2, already set up.  Please call if further questions.   LOS: 2 days   Osten Janek,LES 08/09/2016, 7:58 AM

## 2016-08-10 LAB — GLUCOSE, CAPILLARY
GLUCOSE-CAPILLARY: 115 mg/dL — AB (ref 65–99)
Glucose-Capillary: 185 mg/dL — ABNORMAL HIGH (ref 65–99)

## 2016-08-10 MED ORDER — AMOXICILLIN 500 MG PO CAPS: 500.0000 mg | ORAL_CAPSULE | Freq: Three times a day (TID) | ORAL | 0 refills | Status: DC

## 2016-08-10 MED ORDER — THIAMINE HCL 100 MG PO TABS: 100.0000 mg | ORAL_TABLET | Freq: Every day | ORAL | 0 refills | Status: DC

## 2016-08-10 MED ORDER — FOLIC ACID 1 MG PO TABS: 1.0000 mg | ORAL_TABLET | Freq: Every day | ORAL | 0 refills | Status: DC

## 2016-08-10 MED ORDER — ADULT MULTIVITAMIN W/MINERALS CH: 1.0000 | ORAL_TABLET | Freq: Every day | ORAL | Status: DC

## 2016-08-10 MED ORDER — AMOXICILLIN 250 MG PO CAPS
500.0000 mg | ORAL_CAPSULE | Freq: Three times a day (TID) | ORAL | Status: DC
  Administered 2016-08-10 (×2): 500 mg via ORAL
  Filled 2016-08-10 (×2): qty 2

## 2016-08-10 NOTE — Progress Notes (Signed)
Completed D/C teaching with patient. Demonstrated care of suprapubic catheter. Patient also demonstrated care of suprapubic catherter. Patient will be D/C home with family in stable condition.
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
## 2016-08-10 NOTE — Care Management Important Message (Signed)
Important Message  Patient Details  Name: Ricardo Johnson MRN: [id number] Date of Birth: 1946-05-21   Medicare Important Message Given:  Yes    Erenest Rasher, RN 08/10/2016, 2:57 PM

## 2016-08-10 NOTE — Care Management Note (Signed)
Case Management Note  Patient Details  Name: Ricardo Johnson MRN: [id number] Date of Birth: Oct 31, 1945  Subjective/Objective:    UTI, Acute Renal Failure                Action/Plan: Discharge Planning: AVS reviewed:  NCM spoke to pt at bedside. Pt requesting cane for home. Contacted [facility] DME rep for cane. [place] RN order was cancelled. Pt states Unit RN demonstrated to him on how to empty foley. Has follow up appt with Urologist on 08/16/2016 R 1015 am to follow up foley.   PCP [name] MD  Expected Discharge Date:  08/10/16               Expected Discharge Plan:  Home/Self Care  In-House Referral:  Clinical Social Work, NA  Discharge planning Services  CM Consult  Post Acute Care Choice:  NA Choice offered to:  NA  DME Arranged:  Kasandra Knudsen DME Agency:  [place] Arranged:  NA [place] Agency:  NA  Status of Service:  Completed, signed off  If discussed at [place] of Stay Meetings, dates discussed:    Additional Comments:  Erenest Rasher, RN 08/10/2016, 2:58 PM

## 2016-08-12 ENCOUNTER — Other Ambulatory Visit (HOSPITAL_COMMUNITY): Payer: Medicare PPO

## 2016-08-12 ENCOUNTER — Encounter (HOSPITAL_COMMUNITY): Payer: Self-pay

## 2016-08-12 ENCOUNTER — Other Ambulatory Visit: Payer: Self-pay | Admitting: Urology

## 2016-08-12 ENCOUNTER — Ambulatory Visit (HOSPITAL_COMMUNITY): Payer: Medicare HMO

## 2016-08-12 LAB — CULTURE, BLOOD (ROUTINE X 2)
CULTURE: NO GROWTH
Culture: NO GROWTH

## 2016-08-16 DIAGNOSIS — N401 Enlarged prostate with lower urinary tract symptoms: Secondary | ICD-10-CM | POA: Diagnosis not present

## 2016-08-16 DIAGNOSIS — N320 Bladder-neck obstruction: Secondary | ICD-10-CM | POA: Diagnosis not present

## 2016-08-27 DIAGNOSIS — E784 Other hyperlipidemia: Secondary | ICD-10-CM | POA: Diagnosis not present

## 2016-08-27 DIAGNOSIS — Z0000 Encounter for general adult medical examination without abnormal findings: Secondary | ICD-10-CM | POA: Diagnosis not present

## 2016-08-27 DIAGNOSIS — E7211 Homocystinuria: Secondary | ICD-10-CM | POA: Diagnosis not present

## 2016-08-27 DIAGNOSIS — Z720 Tobacco use: Secondary | ICD-10-CM | POA: Diagnosis not present

## 2016-08-27 DIAGNOSIS — M109 Gout, unspecified: Secondary | ICD-10-CM | POA: Diagnosis not present

## 2016-08-27 DIAGNOSIS — R7303 Prediabetes: Secondary | ICD-10-CM | POA: Diagnosis not present

## 2016-08-27 DIAGNOSIS — E559 Vitamin D deficiency, unspecified: Secondary | ICD-10-CM | POA: Diagnosis not present

## 2016-08-27 DIAGNOSIS — E039 Hypothyroidism, unspecified: Secondary | ICD-10-CM | POA: Diagnosis not present

## 2016-08-27 DIAGNOSIS — I10 Essential (primary) hypertension: Secondary | ICD-10-CM | POA: Diagnosis not present

## 2016-08-27 DIAGNOSIS — Z136 Encounter for screening for cardiovascular disorders: Secondary | ICD-10-CM | POA: Diagnosis not present

## 2016-08-27 DIAGNOSIS — Z131 Encounter for screening for diabetes mellitus: Secondary | ICD-10-CM | POA: Diagnosis not present

## 2016-08-30 NOTE — Patient Instructions (Addendum)
Ricardo Johnson  08/30/2016   Your procedure is scheduled on: 09-05-16  Report to [facility]  Entrance take [name]  elevators to 3rd floor to  Ricardo Johnson at 1:45PM.  Call this number if you have problems the morning of surgery [phone]   Remember: ONLY 1 PERSON MAY GO WITH YOU TO SHORT STAY TO GET  READY MORNING OF Ricardo Johnson.  Do not eat food After Midnight. YOU MAY HAVE CLEAR LIQUIDS FROM MIDNIGHT UNTIL 945AM DAY OF SURGERY. NOTHING BY MOUTH AFTER 945AM!!     Take these medicines the morning of surgery with A SIP OF WATER: LEVOTHYROXINE(SYNTHROID), METOPROLOL, TAMSULOSIN(FLOMAX), FINASTERIDE(PROSCAR)                                You may not have any metal on your body including hair pins and              piercings  Do not wear jewelry, make-up, lotions, powders or perfumes, deodorant             Do not wear nail polish.  Do not shave  48 hours prior to surgery.              Men may shave face and neck.   Do not bring valuables to the hospital. Ricardo Johnson.  Contacts, dentures or bridgework may not be worn into surgery.  Leave suitcase in the car. After surgery it may be brought to your room.               Please read over the following fact sheets you were given: _____________________________________________________________________     CLEAR LIQUID DIET   Foods Allowed                                                                     Foods Excluded  Coffee and tea, regular and decaf                             liquids that you cannot  Plain Jell-O in any flavor                                             see through such as: Fruit ices (not with fruit pulp)                                     milk, soups, orange juice  Iced Popsicles                                    All solid food Carbonated beverages, regular and diet  Cranberry, grape and apple  juices Sports drinks like Gatorade Lightly seasoned clear broth or consume(fat free) Sugar, honey syrup  Sample Menu Breakfast                                Lunch                                     Supper Cranberry juice                    Beef broth                            Chicken broth Jell-O                                     Grape juice                           Apple juice Coffee or tea                        Jell-O                                      Popsicle                                                Coffee or tea                        Coffee or tea  _____________________________________________________________________  Ricardo Johnson - Preparing for Surgery Before surgery, you can play an important role.  Because skin is not sterile, your skin needs to be as free of germs as possible.  You can reduce the number of germs on your skin by washing with CHG (chlorahexidine gluconate) soap before surgery.  CHG is an antiseptic cleaner which kills germs and bonds with the skin to continue killing germs even after washing. Please DO NOT use if you have an allergy to CHG or antibacterial soaps.  If your skin becomes reddened/irritated stop using the CHG and inform your nurse when you arrive at Short Stay. Do not shave (including legs and underarms) for at least 48 hours prior to the first CHG shower.  You may shave your face/neck. Please follow these instructions carefully:  1.  Shower with CHG Soap the night before surgery and the  morning of Surgery.  2.  If you choose to wash your hair, wash your hair first as usual with your  normal  shampoo.  3.  After you shampoo, rinse your hair and body thoroughly to remove the  shampoo.                           4.  Use CHG as you would any other liquid soap.  You can apply chg directly  to the skin and wash  Gently with a scrungie or clean washcloth.  5.  Apply the CHG Soap to your body ONLY FROM THE NECK DOWN.   Do not  use on face/ open                           Wound or open sores. Avoid contact with eyes, ears mouth and genitals (private parts).                       Wash face,  Genitals (private parts) with your normal soap.             6.  Wash thoroughly, paying special attention to the area where your surgery  will be performed.  7.  Thoroughly rinse your body with warm water from the neck down.  8.  DO NOT shower/wash with your normal soap after using and rinsing off  the CHG Soap.                9.  Pat yourself dry with a clean towel.            10.  Wear clean pajamas.            11.  Place clean sheets on your bed the night of your first shower and do not  sleep with pets. Day of Surgery : Do not apply any lotions/deodorants the morning of surgery.  Please wear clean clothes to the hospital/surgery center.  FAILURE TO FOLLOW THESE INSTRUCTIONS MAY RESULT IN THE CANCELLATION OF YOUR SURGERY PATIENT SIGNATURE_________________________________  NURSE SIGNATURE__________________________________  ________________________________________________________________________

## 2016-08-30 NOTE — Progress Notes (Signed)
EKG 08-08-16 epic ECHO 08-08-16 epic Stress test 02-06-15 epic CXR 08-07-16 epic BMP, CBC, PT-INR, HgA1C 08-09-16 epic

## 2016-09-02 ENCOUNTER — Encounter (HOSPITAL_COMMUNITY): Payer: Self-pay

## 2016-09-02 ENCOUNTER — Encounter (HOSPITAL_COMMUNITY)
Admission: RE | Admit: 2016-09-02 | Discharge: 2016-09-02 | Disposition: A | Discharge status: Home / Self Care | Payer: Medicare HMO | Source: Ambulatory Visit | Attending: Urology | Admitting: Urology

## 2016-09-02 DIAGNOSIS — Z8546 Personal history of malignant neoplasm of prostate: Secondary | ICD-10-CM | POA: Diagnosis not present

## 2016-09-02 DIAGNOSIS — I10 Essential (primary) hypertension: Secondary | ICD-10-CM | POA: Diagnosis not present

## 2016-09-02 DIAGNOSIS — N200 Calculus of kidney: Secondary | ICD-10-CM | POA: Diagnosis not present

## 2016-09-02 DIAGNOSIS — N401 Enlarged prostate with lower urinary tract symptoms: Secondary | ICD-10-CM | POA: Diagnosis not present

## 2016-09-02 DIAGNOSIS — Z79899 Other long term (current) drug therapy: Secondary | ICD-10-CM | POA: Diagnosis not present

## 2016-09-02 DIAGNOSIS — N320 Bladder-neck obstruction: Secondary | ICD-10-CM | POA: Diagnosis not present

## 2016-09-02 DIAGNOSIS — N529 Male erectile dysfunction, unspecified: Secondary | ICD-10-CM | POA: Diagnosis not present

## 2016-09-02 DIAGNOSIS — R3129 Other microscopic hematuria: Secondary | ICD-10-CM | POA: Diagnosis not present

## 2016-09-02 DIAGNOSIS — R3916 Straining to void: Secondary | ICD-10-CM | POA: Diagnosis not present

## 2016-09-02 DIAGNOSIS — Z466 Encounter for fitting and adjustment of urinary device: Secondary | ICD-10-CM | POA: Diagnosis not present

## 2016-09-02 DIAGNOSIS — E119 Type 2 diabetes mellitus without complications: Secondary | ICD-10-CM | POA: Diagnosis not present

## 2016-09-02 DIAGNOSIS — Z87891 Personal history of nicotine dependence: Secondary | ICD-10-CM | POA: Diagnosis not present

## 2016-09-02 DIAGNOSIS — Z9119 Patient's noncompliance with other medical treatment and regimen: Secondary | ICD-10-CM | POA: Diagnosis not present

## 2016-09-02 HISTORY — DX: Type 2 diabetes mellitus without complications: E11.9

## 2016-09-02 LAB — COMPREHENSIVE METABOLIC PANEL
ALBUMIN: 3.6 g/dL (ref 3.5–5.0)
ALT: 11 U/L — ABNORMAL LOW (ref 17–63)
ANION GAP: 9 (ref 5–15)
AST: 13 U/L — ABNORMAL LOW (ref 15–41)
Alkaline Phosphatase: 68 U/L (ref 38–126)
BUN: 31 mg/dL — ABNORMAL HIGH (ref 6–20)
CHLORIDE: 103 mmol/L (ref 101–111)
CO2: 27 mmol/L (ref 22–32)
CREATININE: 1.64 mg/dL — AB (ref 0.61–1.24)
Calcium: 9.9 mg/dL (ref 8.9–10.3)
GFR calc non Af Amer: 40 mL/min — ABNORMAL LOW (ref >60)
GFR, EST AFRICAN AMERICAN: 47 mL/min — AB (ref >60)
GLUCOSE: 125 mg/dL — AB (ref 65–99)
Potassium: 4 mmol/L (ref 3.5–5.1)
SODIUM: 139 mmol/L (ref 135–145)
Total Bilirubin: 0.4 mg/dL (ref 0.3–1.2)
Total Protein: 8.5 g/dL — ABNORMAL HIGH (ref 6.5–8.1)

## 2016-09-02 NOTE — Progress Notes (Signed)
CMP results routed to Dr Alinda Money via epic

## 2016-09-04 NOTE — H&P (Signed)
Office Visit Report     08/16/2016   --------------------------------------------------------------------------------   Courtney Paris. Mountz  MRN: [id number]  PRIMARY CARE:  [name], MD  DOB: 02-19-1946, 71 year old Male  REFERRING:  Raynelle Bring, MD  SSN: [id number]  PROVIDER:  Raynelle Bring, M.D.    LOCATION:  [facility] [phone]   --------------------------------------------------------------------------------   CC/HPI: BPH/bladder neck obstruction   [name] returns today. He recently underwent cystoscopy in the operating room after attempts to perform cystoscopy in the office were unsuccessful. I was also unable to find his opening to his bladder neck transurethrally in the operating room. 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 I was unable to place a suprapubic tube intraoperatively based on his history of prior lower abdominal surgery. He was to be scheduled electively for percutaneous suprapubic tube placement with interventional radiology under image guidance. However, he did develop fever last week and presented and was felt to have a possible febrile urinary tract infection. As a precaution, he did undergo percutaneous suprapubic tube placement at that time. His culture eventually came back positive for lactobacillus without other organisms. He has remained on empiric antibiotic therapy and has not had any other fevers. He presents today in preparation for his upcoming procedure later this month when I will plan to again perform cystoscopy with injection of methylene blue via his suprapubic tube to see if I can find a lumen that he is voiding through and gain access to his bladder transurethrally.     ALLERGIES: No Allergies    MEDICATIONS: Finasteride 5 mg tablet 1 tablet PO Daily  Levothyroxine Sodium 100 mcg tablet  Amlodipine Besylate 10 mg tablet  Lisinopril-Hydrochlorothiazide 20 mg-25 mg tablet   Metoprolol Tartrate 25 MG Oral Tablet Oral  Tamsulosin Hcl 0.4 mg capsule, ext release 24 hr 0 Oral     GU PSH: Complex cystometrogram, w/ void pressure and urethral pressure profile studies, any technique - 07/05/2016 Complex Uroflow - 07/05/2016 Cystoscopy - 07/10/2016 Emg surf Electrd - 07/05/2016 Inject For cystogram - 07/05/2016 Intrabd voidng Press - 07/05/2016 PLACE RT DEVICE/MARKER, PROS - 2009      PSH Notes: Prostate Place Interstitial Dev For Radiation Guide Multiple   NON-GU PSH: None   GU PMH: Urinary Hesitancy - 06/19/2016 BPH w/LUTS, Benign prostatic hyperplasia (BPH) with straining on urination - 10/28/2015 Dysuria, Dysuria - 10/28/2015 Other microscopic hematuria, Microscopic hematuria - 10/28/2015 Prostate Cancer, Prostate cancer - 10/28/2015 ED, arterial insufficiency, Erectile dysfunction due to arterial insufficiency - 2014 Prostate Cancer, History, Prostate Cancer - 2014 Urinary Tract Inf, Unspec site, Urinary tract infection - 2014      PMH Notes:   1) Prostate cancer: He is s/p radiation seed implantation in January 2005 under the care of Dr. Joelyn Oms. He has had no evidence for disease recurrence thus far. He has been non-compliant with recommended follow up.   TNM stage: cT1c Nx Mx  Gleason score: 3+3=6  PSA nadir: 0.03 (Dec 2011)   2) BPH/LUTS: He has been treated with medical therapy with good results until December 2011 when his symptoms were noted to be markedly worse including frequency, urgency, and nocturia (up to 10 times per night). He was non-compliant with f/u and again presented to me in August 2017 with worsening LUTS (IPSS 33). He was not on finasteride at that time and this was restarted but without improvement.   Current treatment: Tamsulosin 0.4 mg, Finasteride 5 mg  Jan 2018: Cystoscopy - obliterated prostatic urethra, unable to find bladder neck  Feb 2018: Cystoscopy in OR - still unable to access bladder  Feb 2018: IR percutaneous placement  of SP tube  Mar 2018:   3) Erectile dysfunction: He has had a good response to Levitra in the past.   Current treatment: Levitra 20 mg   4) Microscopic hematuria: This was incidentally noted in December 2011. He has a history of smoking and did receive pelvic radiation for prostate cancer. He underwent a complete urologic evaluation in February 2012 including CT imaging which demonstrated bilateral renal calculi and diffuse bladder thickening but cystoscopy and cytology were negative.   5) Urolithiasis: He has incidentally detected asymptomatic bilateral renal calculi.     NON-GU PMH: Personal history of other diseases of the circulatory system, History of hypertension - 2014 Personal history of other endocrine, nutritional and metabolic disease, History of hypercholesterolemia - 2014 Heartburn    FAMILY HISTORY: 3 daughters - Daughter 5 sons - Son   SOCIAL HISTORY: Marital Status: Widowed Current Smoking Status: Patient has never smoked.  Social Drinker.  Drinks 1 caffeinated drink per day.     Notes: Marital History - Currently Married, Alcohol Use, Occupation:, Tobacco Use   REVIEW OF SYSTEMS:    GU Review Male:   Patient reports burning/ pain with urination. Patient denies frequent urination, hard to postpone urination, get up at night to urinate, leakage of urine, stream starts and stops, trouble starting your streams, and have to strain to urinate .  Gastrointestinal (Lower):   Patient reports diarrhea. Patient denies constipation.  Gastrointestinal (Upper):   Patient reports nausea. Patient denies vomiting.  Constitutional:   Patient denies fever, night sweats, weight loss, and fatigue.  Skin:   Patient denies skin rash/ lesion and itching.  Eyes:   Patient denies blurred vision and double vision.  Ears/ Nose/ Throat:   Patient denies sore throat and sinus problems.  Hematologic/Lymphatic:   Patient denies easy bruising and swollen glands.  Cardiovascular:   Patient denies  leg swelling and chest pains.  Respiratory:   Patient denies cough and shortness of breath.  Endocrine:   Patient denies excessive thirst.  Musculoskeletal:   Patient reports back pain. Patient denies joint pain.  Neurological:   Patient denies headaches and dizziness.  Psychologic:   Patient denies depression and anxiety.   VITAL SIGNS:      08/16/2016 11:06 AM  Weight 154 lb / 69.85 kg  Height 66 in / 167.64 cm  BP 148/74 mmHg  Pulse 103 /min  BMI 24.9 kg/m   MULTI-SYSTEM PHYSICAL EXAMINATION:    Constitutional: Well-nourished. No physical deformities. Normally developed. Good grooming.  Respiratory: No labored breathing, no use of accessory muscles. Clear.  Cardiovascular: Normal temperature, normal extremity pulses, no swelling, no varicosities. Regular rate and rhythm.  Gastrointestinal: He has an indwelling 12 French suprapubic tube draining grossly clear urine.     PAST DATA REVIEWED:  Source Of History:  Patient   02/14/16 04/26/11 06/08/10 02/24/09 04/05/08 10/06/07 04/02/07 09/26/06  PSA  Total PSA 0.05 ng/dl 0.03  0.03  0.07  0.10  0.10  0.15  0.25     PROCEDURES:          Urinalysis w/Scope Dipstick Dipstick Cont'd Micro  Color: Yellow Bilirubin: Neg WBC/hpf: 0 - 5/hpf  Appearance: Clear Ketones: Neg RBC/hpf: 3 - 10/hpf  Specific Gravity: 1.020 Blood: 3+ Bacteria: NS (Not Seen)  pH: 5.5 Protein: 1+ Cystals: NS (Not Seen)  Glucose: Neg Urobilinogen: 1.0 Casts: Hyaline    Nitrites: Neg Trichomonas: Not Present    Leukocyte Esterase: Neg Mucous: Present      Epithelial Cells: NS (Not Seen)      Yeast: NS (Not Seen)      Sperm: Not Present    ASSESSMENT:      ICD-10 Details  1 GU:   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 Bladder outlet obstruction/BPH: He will plan to proceed to the operating room on 3/22 for his planned procedure which will include cystoscopy, possible retrograde ureterography, possible injection of methylene blue via his suprapubic tube and possible exchange of his suprapubic tube. If I am able to obtain access transurethrally, we may open up this area but we'll place a catheter. The suprapubic tube may be clamped but will not be removed. He has daughter have a good understanding of the procedure and all questions were answered to their stated satisfaction. We reviewed the potential risks and complications as well as recovery process. His urine has been cultured for preoperative purposes today.   2. Prostate cancer: His next PSA is due for the summer of 2018.   Cc: Dr. Iona Beard Osei-Bonsu    * Signed by Raynelle Bring, M.D. on 08/16/16 at 10:31 PM (EST)*

## 2016-09-05 ENCOUNTER — Encounter (HOSPITAL_COMMUNITY)
Admission: RE | Disposition: A | Discharge status: Home / Self Care | Payer: Self-pay | Source: Ambulatory Visit | Attending: Urology

## 2016-09-05 ENCOUNTER — Ambulatory Visit (HOSPITAL_COMMUNITY): Payer: Medicare HMO | Admitting: Certified Registered Nurse Anesthetist

## 2016-09-05 ENCOUNTER — Encounter (HOSPITAL_COMMUNITY): Payer: Self-pay | Admitting: *Deleted

## 2016-09-05 ENCOUNTER — Ambulatory Visit (HOSPITAL_COMMUNITY)
Admission: RE | Admit: 2016-09-05 | Discharge: 2016-09-05 | Disposition: A | Discharge status: Home / Self Care | Payer: Medicare HMO | Source: Ambulatory Visit | Attending: Urology | Admitting: Urology

## 2016-09-05 DIAGNOSIS — N390 Urinary tract infection, site not specified: Secondary | ICD-10-CM | POA: Diagnosis not present

## 2016-09-05 DIAGNOSIS — N200 Calculus of kidney: Secondary | ICD-10-CM | POA: Insufficient documentation

## 2016-09-05 DIAGNOSIS — N401 Enlarged prostate with lower urinary tract symptoms: Secondary | ICD-10-CM | POA: Insufficient documentation

## 2016-09-05 DIAGNOSIS — Z435 Encounter for attention to cystostomy: Secondary | ICD-10-CM | POA: Diagnosis not present

## 2016-09-05 DIAGNOSIS — Z466 Encounter for fitting and adjustment of urinary device: Secondary | ICD-10-CM | POA: Diagnosis not present

## 2016-09-05 DIAGNOSIS — R3916 Straining to void: Secondary | ICD-10-CM | POA: Insufficient documentation

## 2016-09-05 DIAGNOSIS — N179 Acute kidney failure, unspecified: Secondary | ICD-10-CM | POA: Diagnosis not present

## 2016-09-05 DIAGNOSIS — R3129 Other microscopic hematuria: Secondary | ICD-10-CM | POA: Diagnosis not present

## 2016-09-05 DIAGNOSIS — N529 Male erectile dysfunction, unspecified: Secondary | ICD-10-CM | POA: Diagnosis not present

## 2016-09-05 DIAGNOSIS — E119 Type 2 diabetes mellitus without complications: Secondary | ICD-10-CM | POA: Insufficient documentation

## 2016-09-05 DIAGNOSIS — Z79899 Other long term (current) drug therapy: Secondary | ICD-10-CM | POA: Insufficient documentation

## 2016-09-05 DIAGNOSIS — N320 Bladder-neck obstruction: Secondary | ICD-10-CM | POA: Diagnosis not present

## 2016-09-05 DIAGNOSIS — I10 Essential (primary) hypertension: Secondary | ICD-10-CM | POA: Insufficient documentation

## 2016-09-05 DIAGNOSIS — Z9119 Patient's noncompliance with other medical treatment and regimen: Secondary | ICD-10-CM | POA: Insufficient documentation

## 2016-09-05 DIAGNOSIS — Z87891 Personal history of nicotine dependence: Secondary | ICD-10-CM | POA: Insufficient documentation

## 2016-09-05 DIAGNOSIS — Z8546 Personal history of malignant neoplasm of prostate: Secondary | ICD-10-CM | POA: Insufficient documentation

## 2016-09-05 HISTORY — PX: CYSTOSCOPY: SHX5120

## 2016-09-05 LAB — GLUCOSE, CAPILLARY
GLUCOSE-CAPILLARY: 118 mg/dL — AB (ref 65–99)
Glucose-Capillary: 118 mg/dL — ABNORMAL HIGH (ref 65–99)

## 2016-09-05 SURGERY — CYSTOSCOPY
Anesthesia: General | Site: Urethra

## 2016-09-05 MED ORDER — OXYCODONE HCL 5 MG PO TABS: 5.0000 mg | ORAL_TABLET | Freq: Once | ORAL | Status: DC | PRN

## 2016-09-05 MED ORDER — STERILE WATER FOR IRRIGATION IR SOLN
Status: DC | PRN
  Administered 2016-09-05: 10 mL

## 2016-09-05 MED ORDER — PROPOFOL 10 MG/ML IV BOLUS
INTRAVENOUS | Status: AC
  Filled 2016-09-05: qty 20

## 2016-09-05 MED ORDER — LIDOCAINE 2% (20 MG/ML) 5 ML SYRINGE
INTRAMUSCULAR | Status: AC
  Filled 2016-09-05: qty 5

## 2016-09-05 MED ORDER — ONDANSETRON HCL 4 MG/2ML IJ SOLN
INTRAMUSCULAR | Status: DC | PRN
  Administered 2016-09-05: 4 mg via INTRAVENOUS

## 2016-09-05 MED ORDER — FENTANYL CITRATE (PF) 100 MCG/2ML IJ SOLN
INTRAMUSCULAR | Status: AC
  Filled 2016-09-05: qty 2

## 2016-09-05 MED ORDER — ONDANSETRON HCL 4 MG/2ML IJ SOLN
INTRAMUSCULAR | Status: AC
  Filled 2016-09-05: qty 2

## 2016-09-05 MED ORDER — METHYLENE BLUE 0.5 % INJ SOLN
INTRAVENOUS | Status: AC
  Filled 2016-09-05: qty 10

## 2016-09-05 MED ORDER — CEFTRIAXONE SODIUM 2 G IJ SOLR
2.0000 g | Freq: Once | INTRAMUSCULAR | Status: AC
  Administered 2016-09-05: 2 g via INTRAVENOUS
  Filled 2016-09-05 (×2): qty 2

## 2016-09-05 MED ORDER — OXYCODONE HCL 5 MG/5ML PO SOLN: 5.0000 mg | Freq: Once | ORAL | Status: DC | PRN

## 2016-09-05 MED ORDER — PHENYLEPHRINE HCL 10 MG/ML IJ SOLN
INTRAMUSCULAR | Status: DC | PRN
  Administered 2016-09-05 (×2): 80 ug via INTRAVENOUS

## 2016-09-05 MED ORDER — MEPERIDINE HCL 50 MG/ML IJ SOLN
INTRAMUSCULAR | Status: AC
  Filled 2016-09-05: qty 1

## 2016-09-05 MED ORDER — METHYLENE BLUE 0.5 % INJ SOLN
INTRAVENOUS | Status: DC | PRN
  Administered 2016-09-05: 10 mL

## 2016-09-05 MED ORDER — PROPOFOL 10 MG/ML IV BOLUS
INTRAVENOUS | Status: DC | PRN
  Administered 2016-09-05: 200 mg via INTRAVENOUS

## 2016-09-05 MED ORDER — ONDANSETRON HCL 4 MG/2ML IJ SOLN: 4.0000 mg | Freq: Four times a day (QID) | INTRAMUSCULAR | Status: DC | PRN

## 2016-09-05 MED ORDER — LIDOCAINE HCL 1 % IJ SOLN
INTRAMUSCULAR | Status: DC | PRN
  Administered 2016-09-05: 60 mg via INTRADERMAL

## 2016-09-05 MED ORDER — SODIUM CHLORIDE 0.9 % IR SOLN
Status: DC | PRN
  Administered 2016-09-05: 6000 mL via INTRAVESICAL

## 2016-09-05 MED ORDER — FENTANYL CITRATE (PF) 100 MCG/2ML IJ SOLN
INTRAMUSCULAR | Status: DC | PRN
  Administered 2016-09-05 (×2): 50 ug via INTRAVENOUS
  Administered 2016-09-05: 25 ug via INTRAVENOUS
  Administered 2016-09-05: 50 ug via INTRAVENOUS

## 2016-09-05 MED ORDER — MIDAZOLAM HCL 5 MG/5ML IJ SOLN
INTRAMUSCULAR | Status: DC | PRN
  Administered 2016-09-05: 2 mg via INTRAVENOUS

## 2016-09-05 MED ORDER — LACTATED RINGERS IV SOLN
INTRAVENOUS | Status: DC
  Administered 2016-09-05 (×2): via INTRAVENOUS

## 2016-09-05 MED ORDER — MIDAZOLAM HCL 2 MG/2ML IJ SOLN
INTRAMUSCULAR | Status: AC
  Filled 2016-09-05: qty 2

## 2016-09-05 MED ORDER — HYDROCODONE-ACETAMINOPHEN 5-325 MG PO TABS: 1.0000 | ORAL_TABLET | Freq: Four times a day (QID) | ORAL | 0 refills | Status: DC | PRN

## 2016-09-05 MED ORDER — HYDROMORPHONE HCL 1 MG/ML IJ SOLN: 0.2500 mg | INTRAMUSCULAR | Status: DC | PRN

## 2016-09-05 SURGICAL SUPPLY — 27 items
BAG URINE DRAINAGE (UROLOGICAL SUPPLIES) ×7 IMPLANT
BAG URO CATCHER STRL LF (MISCELLANEOUS) ×4 IMPLANT
BALLN NEPHROSTOMY (BALLOONS) ×4
BALLOON NEPHROSTOMY (BALLOONS) ×1 IMPLANT
CATH FOLEY 2W COUNCIL 5CC 16FR (CATHETERS) ×3 IMPLANT
CATH FOLEY 2WAY SLVR  5CC 14FR (CATHETERS) ×2
CATH FOLEY 2WAY SLVR 5CC 14FR (CATHETERS) ×1 IMPLANT
CATH HEMA 3WAY 30CC 22FR COUDE (CATHETERS) IMPLANT
CATH INTERMIT  6FR 70CM (CATHETERS) ×4 IMPLANT
CLOTH BEACON ORANGE TIMEOUT ST (SAFETY) ×4 IMPLANT
ELECT REM PT RETURN 15FT ADLT (MISCELLANEOUS) ×4 IMPLANT
GAUZE SPONGE 4X4 12PLY STRL (GAUZE/BANDAGES/DRESSINGS) ×3 IMPLANT
GLOVE BIOGEL M STRL SZ7.5 (GLOVE) ×4 IMPLANT
GOWN STRL REUS W/TWL LRG LVL3 (GOWN DISPOSABLE) ×8 IMPLANT
GOWN STRL REUS W/TWL XL LVL3 (GOWN DISPOSABLE) ×4 IMPLANT
GUIDEWIRE STR DUAL SENSOR (WIRE) ×4 IMPLANT
HOLDER FOLEY CATH W/STRAP (MISCELLANEOUS) IMPLANT
LOOP CUT BIPOLAR 24F LRG (ELECTROSURGICAL) IMPLANT
MANIFOLD NEPTUNE II (INSTRUMENTS) ×4 IMPLANT
PACK CYSTO (CUSTOM PROCEDURE TRAY) ×4 IMPLANT
SET ASPIRATION TUBING (TUBING) IMPLANT
SUT SILK 0 FSL (SUTURE) ×3 IMPLANT
SYR 50ML LL SCALE MARK (SYRINGE) ×3 IMPLANT
SYRINGE IRR TOOMEY STRL 70CC (SYRINGE) ×4 IMPLANT
TAPE CLOTH SURG 4X10 WHT LF (GAUZE/BANDAGES/DRESSINGS) ×3 IMPLANT
TUBING CONNECTING 10 (TUBING) ×3 IMPLANT
TUBING CONNECTING 10' (TUBING) ×1

## 2016-09-05 NOTE — Anesthesia Preprocedure Evaluation (Signed)
Anesthesia Evaluation  Patient identified by MRN, date of birth, ID band Patient awake    Reviewed: Allergy & Precautions, NPO status , Patient's Chart, lab work & pertinent test results  Airway Mallampati: IV   Neck ROM: full  Mouth opening: Limited Mouth Opening  Dental   Pulmonary former smoker,    breath sounds clear to auscultation       Cardiovascular hypertension,  Rhythm:regular Rate:Normal     Neuro/Psych    GI/Hepatic (+)     substance abuse  alcohol use,   Endo/Other  diabetes, Type 2  Renal/GU stones   BPH. Prostate CA    Musculoskeletal   Abdominal   Peds  Hematology   Anesthesia Other Findings   Reproductive/Obstetrics                             Anesthesia Physical Anesthesia Plan  ASA: III  Anesthesia Plan: General   Post-op Pain Management:    Induction: Intravenous  Airway Management Planned: LMA  Additional Equipment:   Intra-op Plan:   Post-operative Plan:   Informed Consent: I have reviewed the patients History and Physical, chart, labs and discussed the procedure including the risks, benefits and alternatives for the proposed anesthesia with the patient or authorized representative who has indicated his/her understanding and acceptance.     Plan Discussed with: CRNA, Anesthesiologist and Surgeon  Anesthesia Plan Comments:         Anesthesia Quick Evaluation

## 2016-09-05 NOTE — Discharge Instructions (Addendum)
General Anesthesia, Adult, Care After These instructions provide you with information about caring for yourself after your procedure. Your health care provider may also give you more specific instructions. Your treatment has been planned according to current medical practices, but problems sometimes occur. Call your health care provider if you have any problems or questions after your procedure. What can I expect after the procedure? After the procedure, it is common to have: Vomiting. A sore throat. Mental slowness. It is common to feel: Nauseous. Cold or shivery. Sleepy. Tired. Sore or achy, even in parts of your body where you did not have surgery. Follow these instructions at home: For at least 24 hours after the procedure:  Do not: Participate in activities where you could fall or become injured. Drive. Use heavy machinery. Drink alcohol. Take sleeping pills or medicines that cause drowsiness. Make important decisions or sign legal documents. Take care of children on your own. Rest. Eating and drinking  If you vomit, drink water, juice, or soup when you can drink without vomiting. Drink enough fluid to keep your urine clear or pale yellow. Make sure you have little or no nausea before eating solid foods. Follow the diet recommended by your health care provider. General instructions  Have a responsible adult stay with you until you are awake and alert. Return to your normal activities as told by your health care provider. Ask your health care provider what activities are safe for you. Take over-the-counter and prescription medicines only as told by your health care provider. If you smoke, do not smoke without supervision. Keep all follow-up visits as told by your health care provider. This is important. Contact a health care provider if: You continue to have nausea or vomiting at home, and medicines are not helpful. You cannot drink fluids or start eating again. You cannot  urinate after 8-12 hours. You develop a skin rash. You have fever. You have increasing redness at the site of your procedure. Get help right away if: You have difficulty breathing. You have chest pain. You have unexpected bleeding. You feel that you are having a life-threatening or urgent problem. This information is not intended to replace advice given to you by your health care provider. Make sure you discuss any questions you have with your health care provider. Document Released: 09/09/2000 Document Revised: 11/06/2015 Document Reviewed: 05/18/2015 Elsevier Interactive Patient Education  2017 [place] may see some blood in the urine and may have some burning with urination for 48-72 hours.  2. You should call should your catheter stop draining, fever > 101, persistent nausea and vomiting that prevents you from eating or drinking to stay hydrated.

## 2016-09-05 NOTE — Op Note (Signed)
Preoperative diagnosis:  1. Bladder neck stenosis  Postoperative diagnosis: 1. Bladder neck stenosis  Procedure(s): 1. Cystourethroscopy 2.  Suprapubic tube change  Surgeon: Dr. Roxy Horseman, Jr  Anesthesia: General  Complications: None.  EBL: Minimal  Specimens: None   Indication: [name] is a 71 year old gentleman with a history of radiation therapy for prostate cancer.  He has had progressive lower urinary tract symptoms over the past 18 months.  Recently, he underwent an evaluation including a office cystoscopy which revealed significant stricture within the prostatic urethra.  Further evaluation the operating room also demonstrated what appeared to be a completely obliterated urethra without any clear lumen into the bladder neck.  I was unable to contrast in the bladder.  He was however voiding somehow.  He subsequently had a 12 French suprapubic tube placed by interventional radiology and returns today for further evaluation in the operating room setting.  We have reviewed the potential risks and complications of the above procedures and he gives informed consent to proceed.  Description of procedure:  He was taken to the operating room and a general anesthetic was administered.  He was given preoperative antibiotics, placed in the dorsal lithotomy vision, and prepped and draped in the usual sterile fashion.  Next, a preoperative timeout was performed.  Attempted cystourethroscopy again revealed a completely obliterated urethra and the prostatic urethra.  I then instilled methylene blue through the suprapubic tube and filled his bladder.  With pressure on his bladder, I was unable to see any methylene blue coming to the urethra.  After further extensive evaluation of the urethra with both a rigid resectoscope and a flexible cystoscope, I was unable to find any opening into the bladder.  I then turned my attention to the suprapubic tube site.  The indwelling 12 French Cope loop  catheter was removed over a wire.  I then attempted to insert both a 14 and a 16 French [place] catheter and both were unsuccessful.  I then attempted balloon dilation of the suprapubic tube tract with the 24 French Cook balloon dilator.  The tract was dilated to 60 mmHg pressure.  I attempted to place the flexible cystoscope into the bladder antegrade but again was unable to navigate the tract.  I then attempted to place a 16 [place] council tip catheter again which also was unsuccessful.  I was able to place a 14 [place] council tip catheter over the wire with return of blue dye.  This was left in place with the balloon inflated and sewn at the skin level with a 0 silk suture.  A sterile dressing was applied.  The patient tolerated procedure well without complications.  He was able to be awakened and transferred to recovery unit in satisfactory condition.

## 2016-09-05 NOTE — Anesthesia Procedure Notes (Addendum)
Procedure Name: LMA Insertion Date/Time: 09/05/2016 2:41 PM Performed by: Gaston Islam EVETTE Pre-anesthesia Checklist: Patient identified, Emergency Drugs available, Suction available and Patient being monitored Patient Re-evaluated:Patient Re-evaluated prior to inductionOxygen Delivery Method: Circle system utilized Preoxygenation: Pre-oxygenation with 100% oxygen Intubation Type: IV induction Ventilation: Mask ventilation without difficulty LMA: LMA inserted LMA Size: 4.0 Number of attempts: 2 Placement Confirmation: positive ETCO2,  CO2 detector and breath sounds checked- equal and bilateral Tube secured with: Tape Dental Injury: Teeth and Oropharynx as per pre-operative assessment

## 2016-09-05 NOTE — Interval H&P Note (Signed)
History and Physical Interval Note:  09/05/2016 2:07 PM  Ricardo Johnson  has presented today for surgery, with the diagnosis of BENIGN PROSTATIC HYPERPLASIA  The various methods of treatment have been discussed with the patient and family. After consideration of risks, benefits and other options for treatment, the patient has consented to  Procedure(s): TRANSURETHRAL RESECTION OF THE PROSTATE (TURP) (N/A) CYSTOSCOPY (N/A) as a surgical intervention .  The patient's history has been reviewed, patient examined, no change in status, stable for surgery.  I have reviewed the patient's chart and labs.  Questions were answered to the patient's satisfaction.     Colene Mines,LES

## 2016-09-05 NOTE — Transfer of Care (Signed)
Immediate Anesthesia Transfer of Care Note  Patient: Ricardo Johnson  Procedure(s) Performed: Procedure(s): CYSTOSCOPY, URETHROSCOPY, BALLOON DILATION OF SUPRAPUBIC TRACK, PLACEMENT OF 14FR [place] (N/A)  Patient Location: PACU  Anesthesia Type:General  Level of Consciousness: awake, alert  and oriented  Airway & Oxygen Therapy: Patient Spontanous Breathing and Patient connected to face mask oxygen  Post-op Assessment: Report given to RN and Post -op Vital signs reviewed and stable  Post vital signs: Reviewed and stable  Last Vitals:  Vitals:   09/05/16 1348  BP: 128/77  Pulse: 74  Resp: 18  Temp: 36.4 C    Last Pain:  Vitals:   09/05/16 1348  TempSrc: Oral      Patients Stated Pain Goal: 3 ([id number])  Complications: No apparent anesthesia complications

## 2016-09-06 ENCOUNTER — Other Ambulatory Visit (HOSPITAL_COMMUNITY): Payer: Self-pay | Admitting: Urology

## 2016-09-06 ENCOUNTER — Encounter (HOSPITAL_COMMUNITY): Payer: Self-pay | Admitting: Urology

## 2016-09-06 DIAGNOSIS — N320 Bladder-neck obstruction: Secondary | ICD-10-CM

## 2016-09-09 ENCOUNTER — Other Ambulatory Visit (HOSPITAL_COMMUNITY): Payer: Self-pay | Admitting: Urology

## 2016-09-09 DIAGNOSIS — N320 Bladder-neck obstruction: Secondary | ICD-10-CM

## 2016-09-14 ENCOUNTER — Emergency Department (HOSPITAL_COMMUNITY)
Admission: EM | Admit: 2016-09-14 | Discharge: 2016-09-14 | Disposition: A | Discharge status: Home / Self Care | Payer: Medicare HMO | Attending: Emergency Medicine | Admitting: Emergency Medicine

## 2016-09-14 ENCOUNTER — Encounter (HOSPITAL_COMMUNITY): Payer: Self-pay

## 2016-09-14 DIAGNOSIS — E119 Type 2 diabetes mellitus without complications: Secondary | ICD-10-CM | POA: Diagnosis not present

## 2016-09-14 DIAGNOSIS — T83010A Breakdown (mechanical) of cystostomy catheter, initial encounter: Secondary | ICD-10-CM

## 2016-09-14 DIAGNOSIS — I10 Essential (primary) hypertension: Secondary | ICD-10-CM | POA: Diagnosis not present

## 2016-09-14 DIAGNOSIS — Z8546 Personal history of malignant neoplasm of prostate: Secondary | ICD-10-CM | POA: Insufficient documentation

## 2016-09-14 DIAGNOSIS — Z87891 Personal history of nicotine dependence: Secondary | ICD-10-CM | POA: Insufficient documentation

## 2016-09-14 DIAGNOSIS — Y638 Failure in dosage during other surgical and medical care: Secondary | ICD-10-CM | POA: Diagnosis not present

## 2016-09-14 DIAGNOSIS — T83198A Other mechanical complication of other urinary devices and implants, initial encounter: Secondary | ICD-10-CM | POA: Insufficient documentation

## 2016-09-14 NOTE — ED Provider Notes (Signed)
[facility] DEPT Provider Note   CSN: [id number] Arrival date & time: 09/14/16  0865     History   Chief Complaint Chief Complaint  Patient presents with  . Urinary Retention    HPI Ricardo Johnson is a 71 y.o. male.  HPI Patient had surgery catheter placed recently by urology. One of the sutures became dislodged 3 days ago. States he woke this morning with less urine and his catheter bag and came to the emergency department concerned that the catheter had been displaced from the bladder. He.denies any abdominal pain or distention. No nausea or vomiting. Past Medical History:  Diagnosis Date  . Alcohol abuse   . BPH (benign prostatic hypertrophy) 2000-2001  . Diabetes mellitus without complication ([place])    pt reports that they were checking him during last admission for DM but never started any medicine or made any referrals for F/U  . History of kidney stones   . History of nephrolithiasis   . Hypertension   . Prostate cancer ([place]) 2005  . Tobacco abuse     Patient Active Problem List   Diagnosis Date Noted  . UTI (urinary tract infection) 08/07/2016  . Acute renal failure (ARF) ([place]) 08/07/2016  . Anemia 08/07/2016  . Hyperglycemia 08/07/2016  . Hypokalemia 08/07/2016  . Syncope 08/07/2016  . Pain in the chest   . AKI (acute kidney injury) ([place])   . Noncompliance   . Chest pain 01/25/2015  . Accelerated hypertension 01/25/2015  . ADENOCARCINOMA, PROSTATE 02/23/2010  . ALCOHOL ABUSE 02/23/2010  . TOBACCO ABUSE 02/23/2010  . Essential hypertension 02/23/2010  . SYNCOPE 02/23/2010  . BENIGN PROSTATIC HYPERTROPHY, HX OF 02/23/2010    Past Surgical History:  Procedure Laterality Date  . APPENDECTOMY    . CYSTOSCOPY N/A 08/05/2016   Procedure: CYSTOSCOPY, URETHROSCOPY;  Surgeon: Raynelle Bring, MD;  Location: [facility];  Service: Urology;  Laterality: N/A;  . CYSTOSCOPY N/A 09/05/2016   Procedure: CYSTOSCOPY, URETHROSCOPY, BALLOON DILATION OF SUPRAPUBIC TRACK,  PLACEMENT OF 14FR SUPRAPUIC CATHETER;  Surgeon: Raynelle Bring, MD;  Location: [facility];  Service: Urology;  Laterality: N/A;  . LITHOTRIPSY    . RADIOACTIVE SEED IMPLANT     for prostate cancer       Home Medications    Prior to Admission medications   Medication Sig Start Date End Date Taking? Authorizing Provider  amLODipine (NORVASC) 10 MG tablet Take 10 mg by mouth at bedtime.  08/25/15  Yes Historical Provider, MD  finasteride (PROSCAR) 5 MG tablet TAKE 1 TABLET BY MOUTH EVERY DAY 01/04/11  Yes Maitri S Kalia-Reynolds, DO  folic acid (FOLVITE) 1 MG tablet Take 1 tablet (1 mg total) by mouth daily. 08/11/16  Yes Modena Jansky, MD  HYDROcodone-acetaminophen (NORCO/VICODIN) 5-325 MG tablet Take 1-2 tablets by mouth every 6 (six) hours as needed. 09/05/16  Yes Raynelle Bring, MD  levothyroxine (SYNTHROID, LEVOTHROID) 100 MCG tablet Take 100 mcg by mouth daily before breakfast. 07/16/16  Yes Historical Provider, MD  lisinopril-hydrochlorothiazide (PRINZIDE,ZESTORETIC) 20-25 MG tablet Take 1 tablet by mouth daily. 04/22/16  Yes Historical Provider, MD  metoprolol succinate (TOPROL-XL) 100 MG 24 hr tablet Take 100 mg by mouth daily. 08/25/15  Yes Historical Provider, MD  Multiple Vitamin (MULTIVITAMIN WITH MINERALS) TABS tablet Take 1 tablet by mouth daily. 08/11/16  Yes Modena Jansky, MD  Tamsulosin HCl (FLOMAX) 0.4 MG CAPS TAKE ONE CAPSULE BY MOUTH EVERY DAY 11/27/10  Yes Maitri S Kalia-Reynolds, DO  thiamine 100 MG tablet Take 1  tablet (100 mg total) by mouth daily. 08/11/16  Yes Modena Jansky, MD    Family History Family History  Problem Relation Age of Onset  . Heart disease Mother   . Heart disease Father   . Cancer Cousin     Social History Social History  Substance Use Topics  . Smoking status: Former Smoker    Quit date: 01/30/2015  . Smokeless tobacco: Never Used  . Alcohol use No     Comment: "not since i got sick "     Allergies   Patient has no known  allergies.   Review of Systems Review of Systems  Constitutional: Negative for chills and fever.  Gastrointestinal: Negative for abdominal pain, constipation, diarrhea, nausea and vomiting.  Genitourinary: Positive for difficulty urinating. Negative for dysuria, flank pain, frequency and hematuria.  Musculoskeletal: Negative for back pain, myalgias, neck pain and neck stiffness.  Skin: Negative for rash and wound.  Neurological: Negative for weakness, numbness and headaches.  All other systems reviewed and are negative.    Physical Exam Updated Vital Signs BP (!) 164/92 (BP Location: Left Arm)   Pulse 79   Temp 97.8 F (36.6 C) (Oral)   Resp 18   SpO2 100%   Physical Exam  Constitutional: He is oriented to person, place, and time. He appears well-developed and well-nourished. No distress.  HENT:  Head: Normocephalic and atraumatic.  Mouth/Throat: Oropharynx is clear and moist.  Eyes: EOM are normal. Pupils are equal, round, and reactive to light.  Neck: Normal range of motion. Neck supple.  Cardiovascular: Normal rate and regular rhythm.   Pulmonary/Chest: Effort normal and breath sounds normal.  Abdominal: Soft. Bowel sounds are normal. There is no tenderness. There is no rebound and no guarding.  Genitourinary:  Genitourinary Comments: Patient with suprapubic catheter in place. There is a small amount of clear urine in the catheter bag. The suture that attaches catheter to the skin most distally is no longer attached. Patient does not have any suprapubic fullness or tenderness. There is no leaking around the catheter site.  Musculoskeletal: Normal range of motion. He exhibits no edema or tenderness.  Neurological: He is alert and oriented to person, place, and time.  Skin: Skin is warm and dry. Capillary refill takes less than 2 seconds. No rash noted. No erythema.  Psychiatric: He has a normal mood and affect. His behavior is normal.  Nursing note and vitals  reviewed.    ED Treatments / Results  Labs (all labs ordered are listed, but only abnormal results are displayed) Labs Reviewed - No data to display  EKG  EKG Interpretation None       Radiology No results found.  Procedures Procedures (including critical care time)  Medications Ordered in ED Medications - No data to display   Initial Impression / Assessment and Plan / ED Course  I have reviewed the triage vital signs and the nursing notes.  Pertinent labs & imaging results that were available during my care of the patient were reviewed by me and considered in my medical decision making (see chart for details).     Will get bedside bladder scan but suprapubic catheter appears to be in place. Patient has produced greater than 200 mL of urine in the emergency department. Bladder scan without evidence of urinary retention. Catheter appears to be working properly. Secured with tape and advised follow-up with his urologist. Final Clinical Impressions(s) / ED Diagnoses   Final diagnoses:  Suprapubic catheter dysfunction, initial  encounter [facility])    New Prescriptions New Prescriptions   No medications on file     Julianne Rice, MD 09/14/16 1057

## 2016-09-14 NOTE — ED Notes (Signed)
Bladder scan pt for amount of 26 Ml

## 2016-09-14 NOTE — ED Triage Notes (Signed)
He states he recently had suprapubic cath. Insertion by Dr. Alinda Money. Here today re: "tube came out last night".

## 2016-09-14 NOTE — ED Notes (Signed)
Empty leg bag for the amount of 200 ml

## 2016-09-16 ENCOUNTER — Encounter (HOSPITAL_COMMUNITY): Payer: Self-pay | Admitting: Urology

## 2016-09-16 NOTE — Anesthesia Postprocedure Evaluation (Addendum)
Anesthesia Post Note  Patient: Ricardo Johnson  Procedure(s) Performed: Procedure(s) (LRB): CYSTOSCOPY, URETHROSCOPY, BALLOON DILATION OF SUPRAPUBIC TRACK, PLACEMENT OF 14FR [place] (N/A)  Patient location during evaluation: PACU Anesthesia Type: General Level of consciousness: awake and alert and patient cooperative Pain management: pain level controlled Vital Signs Assessment: post-procedure vital signs reviewed and stable Respiratory status: spontaneous breathing and respiratory function stable Cardiovascular status: stable Anesthetic complications: no       Last Vitals:  Vitals:   09/05/16 1816 09/05/16 1855  BP: 130/61 120/77  Pulse: 87 66  Resp: 16 16  Temp: 36.6 C 36.7 C    Last Pain:  Vitals:   09/05/16 1855  TempSrc: Oral                 Adaiah Jaskot S

## 2016-09-20 ENCOUNTER — Ambulatory Visit (HOSPITAL_COMMUNITY)
Admission: RE | Admit: 2016-09-20 | Discharge: 2016-09-20 | Disposition: A | Discharge status: Home / Self Care | Payer: Medicare HMO | Source: Ambulatory Visit | Attending: Urology | Admitting: Urology

## 2016-09-20 DIAGNOSIS — N320 Bladder-neck obstruction: Secondary | ICD-10-CM | POA: Insufficient documentation

## 2016-09-20 DIAGNOSIS — N135 Crossing vessel and stricture of ureter without hydronephrosis: Secondary | ICD-10-CM | POA: Diagnosis not present

## 2016-09-20 MED ORDER — IOTHALAMATE MEGLUMINE 17.2 % UR SOLN: 500.0000 mL | Freq: Once | URETHRAL | Status: DC | PRN

## 2016-09-20 MED ORDER — IOPAMIDOL (ISOVUE-300) INJECTION 61%
200.0000 mL | Freq: Once | INTRAVENOUS | Status: AC | PRN
  Administered 2016-09-20: 110 mL

## 2016-09-20 MED ORDER — IOPAMIDOL (ISOVUE-300) INJECTION 61%
50.0000 mL | Freq: Once | INTRAVENOUS | Status: AC | PRN
  Administered 2016-09-20: 40 mL via URETHRAL

## 2016-09-20 MED ORDER — LIDOCAINE HCL 2 % EX GEL
CUTANEOUS | Status: AC
  Filled 2016-09-20: qty 30

## 2016-09-25 DIAGNOSIS — N320 Bladder-neck obstruction: Secondary | ICD-10-CM | POA: Diagnosis not present

## 2016-09-25 DIAGNOSIS — C61 Malignant neoplasm of prostate: Secondary | ICD-10-CM | POA: Diagnosis not present

## 2016-11-02 ENCOUNTER — Encounter (HOSPITAL_COMMUNITY): Payer: Self-pay | Admitting: Emergency Medicine

## 2016-11-02 ENCOUNTER — Emergency Department (HOSPITAL_COMMUNITY)
Admission: EM | Admit: 2016-11-02 | Discharge: 2016-11-02 | Disposition: A | Discharge status: Home / Self Care | Payer: Medicare HMO | Attending: Emergency Medicine | Admitting: Emergency Medicine

## 2016-11-02 DIAGNOSIS — Z8546 Personal history of malignant neoplasm of prostate: Secondary | ICD-10-CM | POA: Insufficient documentation

## 2016-11-02 DIAGNOSIS — E119 Type 2 diabetes mellitus without complications: Secondary | ICD-10-CM | POA: Diagnosis not present

## 2016-11-02 DIAGNOSIS — B029 Zoster without complications: Secondary | ICD-10-CM | POA: Diagnosis not present

## 2016-11-02 DIAGNOSIS — I10 Essential (primary) hypertension: Secondary | ICD-10-CM | POA: Diagnosis not present

## 2016-11-02 DIAGNOSIS — Z79899 Other long term (current) drug therapy: Secondary | ICD-10-CM | POA: Diagnosis not present

## 2016-11-02 DIAGNOSIS — Z87891 Personal history of nicotine dependence: Secondary | ICD-10-CM | POA: Diagnosis not present

## 2016-11-02 MED ORDER — VALACYCLOVIR HCL 500 MG PO TABS
500.0000 mg | ORAL_TABLET | Freq: Once | ORAL | Status: AC
  Administered 2016-11-02: 500 mg via ORAL
  Filled 2016-11-02: qty 1

## 2016-11-02 MED ORDER — VALACYCLOVIR HCL 1 G PO TABS: 1000.0000 mg | ORAL_TABLET | Freq: Two times a day (BID) | ORAL | 0 refills | Status: AC

## 2016-11-02 MED ORDER — ACETAMINOPHEN 325 MG PO TABS
650.0000 mg | ORAL_TABLET | Freq: Once | ORAL | Status: AC
  Administered 2016-11-02: 650 mg via ORAL
  Filled 2016-11-02: qty 2

## 2016-11-02 MED ORDER — VALACYCLOVIR HCL 1 G PO TABS: 1000.0000 mg | ORAL_TABLET | Freq: Three times a day (TID) | ORAL | 0 refills | Status: DC

## 2016-11-02 NOTE — Discharge Instructions (Signed)
You have a shingles rash. Please take valacyclovir as prescribed. Please monitor the progression of your rash. Contact primary care provider and set up an appointment for reevaluation within 3-5 days to ensure that your rash is improving. Return to the emergency department if your rash worsens, you develop fever, or there is large amounts of yellow discharge from your rash.

## 2016-11-02 NOTE — ED Notes (Signed)
Bed: AQ76 Expected date:  Expected time:  Means of arrival:  Comments: Held

## 2016-11-02 NOTE — ED Triage Notes (Signed)
Pt has blisters on R side from mid abd stretching around to midline of back. Blisters are intact.

## 2016-11-02 NOTE — ED Provider Notes (Signed)
[facility] DEPT Provider Note   CSN: [id number] Arrival date & time: 11/02/16  1511     History   Chief Complaint Chief Complaint  Patient presents with  . Herpes Zoster    HPI Ricardo Johnson is a 71 y.o. 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 Patient noticed a rash 5 days ago. Patient has history of chickenpox as a child. Does not get shingles vaccines. Denies fevers, ear pain, dizziness, changes in vision, eye pain. No oral lesions. No recent medication changes. No rash on palms or [place].  HPI  Past Medical History:  Diagnosis Date  . Alcohol abuse   . BPH (benign prostatic hypertrophy) 2000-2001  . Diabetes mellitus without complication ([place])    pt reports that they were checking him during last admission for DM but never started any medicine or made any referrals for F/U  . History of kidney stones   . History of nephrolithiasis   . Hypertension   . Prostate cancer ([place]) 2005  . Tobacco abuse     Patient Active Problem List   Diagnosis Date Noted  . UTI (urinary tract infection) 08/07/2016  . Acute renal failure (ARF) ([place]) 08/07/2016  . Anemia 08/07/2016  . Hyperglycemia 08/07/2016  . Hypokalemia 08/07/2016  . Syncope 08/07/2016  . Pain in the chest   . AKI (acute kidney injury) ([place])   . Noncompliance   . Chest pain 01/25/2015  . Accelerated hypertension 01/25/2015  . ADENOCARCINOMA, PROSTATE 02/23/2010  . ALCOHOL ABUSE 02/23/2010  . TOBACCO ABUSE 02/23/2010  . Essential hypertension 02/23/2010  . SYNCOPE 02/23/2010  . BENIGN PROSTATIC HYPERTROPHY, HX OF 02/23/2010    Past Surgical History:  Procedure Laterality Date  . APPENDECTOMY    . CYSTOSCOPY N/A 08/05/2016   Procedure: CYSTOSCOPY, URETHROSCOPY;  Surgeon: Raynelle Bring, MD;   Location: [facility];  Service: Urology;  Laterality: N/A;  . CYSTOSCOPY N/A 09/05/2016   Procedure: CYSTOSCOPY, URETHROSCOPY, BALLOON DILATION OF SUPRAPUBIC TRACK, PLACEMENT OF 14FR SUPRAPUIC CATHETER;  Surgeon: Raynelle Bring, MD;  Location: [facility];  Service: Urology;  Laterality: N/A;  . LITHOTRIPSY    . RADIOACTIVE SEED IMPLANT     for prostate cancer       Home Medications    Prior to Admission medications   Medication Sig Start Date End Date Taking? Authorizing Provider  amLODipine (NORVASC) 10 MG tablet Take 10 mg by mouth at bedtime.  08/25/15   [provider]  finasteride (PROSCAR) 5 MG tablet TAKE 1 TABLET BY MOUTH EVERY DAY 01/04/11   Kalia-Reynolds, Maitri S, DO  folic acid (FOLVITE) 1 MG tablet Take 1 tablet (1 mg total) by mouth daily. 08/11/16   Hongalgi, Lenis Dickinson, MD  HYDROcodone-acetaminophen (NORCO/VICODIN) 5-325 MG tablet Take 1-2 tablets by mouth every 6 (six) hours as needed. 09/05/16   Raynelle Bring, MD  levothyroxine (SYNTHROID, LEVOTHROID) 100 MCG tablet Take 100 mcg by mouth daily before breakfast. 07/16/16   [provider]  lisinopril-hydrochlorothiazide (PRINZIDE,ZESTORETIC) 20-25 MG tablet Take 1 tablet by mouth daily. 04/22/16   [provider]  metoprolol succinate (TOPROL-XL) 100 MG 24 hr tablet Take 100 mg by mouth daily. 08/25/15   [provider]  Multiple Vitamin (MULTIVITAMIN WITH MINERALS) TABS tablet Take 1 tablet by mouth daily. 08/11/16   Modena Jansky, MD  Tamsulosin  HCl (FLOMAX) 0.4 MG CAPS TAKE ONE CAPSULE BY MOUTH EVERY DAY 11/27/10   Kalia-Reynolds, Maitri S, DO  thiamine 100 MG tablet Take 1 tablet (100 mg total) by mouth daily. 08/11/16   Hongalgi, Lenis Dickinson, MD  valACYclovir (VALTREX) 1000 MG tablet Take 1 tablet (1,000 mg total) by mouth 2 (two) times daily. 11/02/16 11/08/16  Kinnie Feil, PA-C    Family History Family History  Problem Relation Age of Onset  . Heart disease Mother   . Heart disease Father     . Cancer Cousin     Social History Social History  Substance Use Topics  . Smoking status: Former Smoker    Quit date: 01/30/2015  . Smokeless tobacco: Never Used  . Alcohol use No     Comment: "not since i got sick "     Allergies   Patient has no known allergies.   Review of Systems Review of Systems  Constitutional: Negative for chills and fever.  HENT: Negative for congestion, ear discharge, ear pain, hearing loss, rhinorrhea, sore throat and trouble swallowing.   Eyes: Negative for photophobia, pain, redness and itching.  Respiratory: Negative for cough, choking and shortness of breath.   Cardiovascular: Negative for chest pain.  Gastrointestinal: Negative for abdominal pain, constipation, diarrhea, nausea and vomiting.  Genitourinary: Positive for difficulty urinating (chronic problem).  Musculoskeletal: Negative for arthralgias.  Skin: Positive for rash.  Neurological: Negative for dizziness and headaches.     Physical Exam Updated Vital Signs BP (!) 147/81   Pulse 87   Temp 97.7 F (36.5 C) (Oral)   Resp 16   SpO2 100%   Physical Exam  Constitutional: He is oriented to person, place, and time. He appears well-developed and well-nourished. No distress.  Non toxic appearing  HENT:  Head: Normocephalic and atraumatic.  Nose: Nose normal.  Mouth/Throat: Oropharynx is clear and moist. No oropharyngeal exudate.  TMs normal No facial, head or neck rash  Eyes:  PERRL and EOMs normal No eye redness, discharge. Conjunctiva and sclera normal  Neck: Normal range of motion. Neck supple.  Cardiovascular: Normal rate, regular rhythm, normal heart sounds and intact distal pulses.   No murmur heard. Pulmonary/Chest: Effort normal and breath sounds normal. No respiratory distress. He has no wheezes. He has no rales.  Abdominal: Soft. Bowel sounds are normal. He exhibits no distension. There is no tenderness.  Musculoskeletal: Normal range of motion. He exhibits no  deformity.  Lymphadenopathy:    He has no cervical adenopathy.  Neurological: He is alert and oriented to person, place, and time.  Skin: Skin is warm and dry. Capillary refill takes less than 2 seconds. Rash noted.  erythematous papular rash with grouped vesicles in dermatomal distribution to right flank, tender  Psychiatric: He has a normal mood and affect. His behavior is normal. Judgment and thought content normal.  Nursing note and vitals reviewed.    ED Treatments / Results  Labs (all labs ordered are listed, but only abnormal results are displayed) Labs Reviewed - No data to display  EKG  EKG Interpretation None       Radiology No results found.  Procedures Procedures (including critical care time)  Medications Ordered in ED Medications  valACYclovir (VALTREX) tablet 500 mg (not administered)  acetaminophen (TYLENOL) tablet 650 mg (not administered)     Initial Impression / Assessment and Plan / ED Course  I have reviewed the triage vital signs and the nursing notes.  Pertinent labs & imaging results  that were available during my care of the patient were reviewed by me and considered in my medical decision making (see chart for details).   71 year old male presents with erythematous, burning, stinging papular rash to the right flank 5 days. Rash very suspicious for herpes zoster.  No involvement of oral mucosa, ears or eyes. Patient has h/o renal insufficiency (creatinine last month).  Will discharge with valacyclovir and PCP f/u in 2-3 days for re-evaluation of rash progression. No facial or oral angioedema.  No respiratory compromise.  No new topical hygiene products used.  No new medications.  No evidence of animal bite wounds.  No fluctuance, induration that would suggest abscess or cellulitis. Very low suspicion for dermatological emergency including EM, SJS, TEN or meningococcemia.  Patient otherwise feels well and at baseline, no URI or viral type systemic  symptoms that would suggest viral exanthem.  Patient will be discharged with anti-virals (renally adjusted) and NSAIDs prn.  Strict ED return precautions given, patient is aware of red flag symptoms that would warrant return to ED for further evaluation and tx.   Patient, ED treatment and discharge plan was discussed with supervising physician who also evaluated the patient and is agreeable with plan.   Final Clinical Impressions(s) / ED Diagnoses   Final diagnoses:  Herpes zoster without complication    New Prescriptions Current Discharge Medication List    START taking these medications   Details  valACYclovir (VALTREX) 1000 MG tablet Take 1 tablet (1,000 mg total) by mouth 2 (two) times daily. Qty: 12 tablet, Refills: 0         Arlean Hopping 11/02/16 1659    Quintella Reichert, MD 11/04/16 1544

## 2016-11-15 NOTE — Addendum Note (Signed)
Addendum  created 11/15/16 1023 by Effie Berkshire, MD   Sign clinical note

## 2016-11-28 DIAGNOSIS — N320 Bladder-neck obstruction: Secondary | ICD-10-CM | POA: Diagnosis not present

## 2016-12-10 DIAGNOSIS — Z720 Tobacco use: Secondary | ICD-10-CM | POA: Diagnosis not present

## 2016-12-10 DIAGNOSIS — E784 Other hyperlipidemia: Secondary | ICD-10-CM | POA: Diagnosis not present

## 2016-12-10 DIAGNOSIS — M109 Gout, unspecified: Secondary | ICD-10-CM | POA: Diagnosis not present

## 2016-12-10 DIAGNOSIS — N183 Chronic kidney disease, stage 3 (moderate): Secondary | ICD-10-CM | POA: Diagnosis not present

## 2016-12-10 DIAGNOSIS — I10 Essential (primary) hypertension: Secondary | ICD-10-CM | POA: Diagnosis not present

## 2016-12-10 DIAGNOSIS — E559 Vitamin D deficiency, unspecified: Secondary | ICD-10-CM | POA: Diagnosis not present

## 2016-12-10 DIAGNOSIS — E7211 Homocystinuria: Secondary | ICD-10-CM | POA: Diagnosis not present

## 2016-12-10 DIAGNOSIS — E039 Hypothyroidism, unspecified: Secondary | ICD-10-CM | POA: Diagnosis not present

## 2016-12-10 DIAGNOSIS — E1165 Type 2 diabetes mellitus with hyperglycemia: Secondary | ICD-10-CM | POA: Diagnosis not present

## 2016-12-19 DIAGNOSIS — Z960 Presence of urogenital implants: Secondary | ICD-10-CM | POA: Diagnosis not present

## 2016-12-19 DIAGNOSIS — Z923 Personal history of irradiation: Secondary | ICD-10-CM | POA: Diagnosis not present

## 2016-12-19 DIAGNOSIS — N3289 Other specified disorders of bladder: Secondary | ICD-10-CM | POA: Diagnosis not present

## 2016-12-19 DIAGNOSIS — N359 Urethral stricture, unspecified: Secondary | ICD-10-CM | POA: Diagnosis not present

## 2016-12-19 DIAGNOSIS — K649 Unspecified hemorrhoids: Secondary | ICD-10-CM | POA: Diagnosis not present

## 2016-12-19 DIAGNOSIS — Z8546 Personal history of malignant neoplasm of prostate: Secondary | ICD-10-CM | POA: Diagnosis not present

## 2016-12-24 DIAGNOSIS — Z720 Tobacco use: Secondary | ICD-10-CM | POA: Diagnosis not present

## 2016-12-24 DIAGNOSIS — E784 Other hyperlipidemia: Secondary | ICD-10-CM | POA: Diagnosis not present

## 2016-12-24 DIAGNOSIS — E7211 Homocystinuria: Secondary | ICD-10-CM | POA: Diagnosis not present

## 2016-12-24 DIAGNOSIS — E1165 Type 2 diabetes mellitus with hyperglycemia: Secondary | ICD-10-CM | POA: Diagnosis not present

## 2016-12-24 DIAGNOSIS — I10 Essential (primary) hypertension: Secondary | ICD-10-CM | POA: Diagnosis not present

## 2016-12-24 DIAGNOSIS — E559 Vitamin D deficiency, unspecified: Secondary | ICD-10-CM | POA: Diagnosis not present

## 2016-12-24 DIAGNOSIS — E039 Hypothyroidism, unspecified: Secondary | ICD-10-CM | POA: Diagnosis not present

## 2016-12-24 DIAGNOSIS — N183 Chronic kidney disease, stage 3 (moderate): Secondary | ICD-10-CM | POA: Diagnosis not present

## 2016-12-24 DIAGNOSIS — M109 Gout, unspecified: Secondary | ICD-10-CM | POA: Diagnosis not present

## 2016-12-26 DIAGNOSIS — N320 Bladder-neck obstruction: Secondary | ICD-10-CM | POA: Diagnosis not present

## 2016-12-31 DIAGNOSIS — Z01818 Encounter for other preprocedural examination: Secondary | ICD-10-CM | POA: Diagnosis not present

## 2017-01-07 DIAGNOSIS — C61 Malignant neoplasm of prostate: Secondary | ICD-10-CM | POA: Diagnosis not present

## 2017-01-07 DIAGNOSIS — N359 Urethral stricture, unspecified: Secondary | ICD-10-CM | POA: Diagnosis not present

## 2017-01-07 DIAGNOSIS — I10 Essential (primary) hypertension: Secondary | ICD-10-CM | POA: Diagnosis not present

## 2017-01-07 DIAGNOSIS — E039 Hypothyroidism, unspecified: Secondary | ICD-10-CM | POA: Diagnosis not present

## 2017-01-07 DIAGNOSIS — N358 Other urethral stricture: Secondary | ICD-10-CM | POA: Diagnosis not present

## 2017-01-28 DIAGNOSIS — Z720 Tobacco use: Secondary | ICD-10-CM | POA: Diagnosis not present

## 2017-01-28 DIAGNOSIS — E7211 Homocystinuria: Secondary | ICD-10-CM | POA: Diagnosis not present

## 2017-01-28 DIAGNOSIS — N183 Chronic kidney disease, stage 3 (moderate): Secondary | ICD-10-CM | POA: Diagnosis not present

## 2017-01-28 DIAGNOSIS — E559 Vitamin D deficiency, unspecified: Secondary | ICD-10-CM | POA: Diagnosis not present

## 2017-01-28 DIAGNOSIS — E1165 Type 2 diabetes mellitus with hyperglycemia: Secondary | ICD-10-CM | POA: Diagnosis not present

## 2017-01-28 DIAGNOSIS — M109 Gout, unspecified: Secondary | ICD-10-CM | POA: Diagnosis not present

## 2017-01-28 DIAGNOSIS — E039 Hypothyroidism, unspecified: Secondary | ICD-10-CM | POA: Diagnosis not present

## 2017-01-28 DIAGNOSIS — E784 Other hyperlipidemia: Secondary | ICD-10-CM | POA: Diagnosis not present

## 2017-01-28 DIAGNOSIS — I10 Essential (primary) hypertension: Secondary | ICD-10-CM | POA: Diagnosis not present

## 2017-02-03 DIAGNOSIS — Z0000 Encounter for general adult medical examination without abnormal findings: Secondary | ICD-10-CM | POA: Diagnosis not present

## 2017-02-03 DIAGNOSIS — E559 Vitamin D deficiency, unspecified: Secondary | ICD-10-CM | POA: Diagnosis not present

## 2017-02-03 DIAGNOSIS — M109 Gout, unspecified: Secondary | ICD-10-CM | POA: Diagnosis not present

## 2017-02-03 DIAGNOSIS — I10 Essential (primary) hypertension: Secondary | ICD-10-CM | POA: Diagnosis not present

## 2017-02-03 DIAGNOSIS — N183 Chronic kidney disease, stage 3 (moderate): Secondary | ICD-10-CM | POA: Diagnosis not present

## 2017-02-03 DIAGNOSIS — Z720 Tobacco use: Secondary | ICD-10-CM | POA: Diagnosis not present

## 2017-02-03 DIAGNOSIS — E7211 Homocystinuria: Secondary | ICD-10-CM | POA: Diagnosis not present

## 2017-02-03 DIAGNOSIS — E1165 Type 2 diabetes mellitus with hyperglycemia: Secondary | ICD-10-CM | POA: Diagnosis not present

## 2017-02-03 DIAGNOSIS — E039 Hypothyroidism, unspecified: Secondary | ICD-10-CM | POA: Diagnosis not present

## 2017-02-04 DIAGNOSIS — N320 Bladder-neck obstruction: Secondary | ICD-10-CM | POA: Diagnosis not present

## 2017-02-04 DIAGNOSIS — Z1211 Encounter for screening for malignant neoplasm of colon: Secondary | ICD-10-CM | POA: Diagnosis not present

## 2017-02-04 DIAGNOSIS — K6289 Other specified diseases of anus and rectum: Secondary | ICD-10-CM | POA: Diagnosis not present

## 2017-02-04 DIAGNOSIS — K5900 Constipation, unspecified: Secondary | ICD-10-CM | POA: Diagnosis not present

## 2017-02-04 DIAGNOSIS — K602 Anal fissure, unspecified: Secondary | ICD-10-CM | POA: Diagnosis not present

## 2017-02-05 NOTE — Addendum Note (Signed)
Addendum  created 02/05/17 1341 by Albertha Ghee, MD   Sign clinical note

## 2017-02-28 DIAGNOSIS — K529 Noninfective gastroenteritis and colitis, unspecified: Secondary | ICD-10-CM | POA: Diagnosis not present

## 2017-02-28 DIAGNOSIS — K640 First degree hemorrhoids: Secondary | ICD-10-CM | POA: Diagnosis not present

## 2017-02-28 DIAGNOSIS — K5730 Diverticulosis of large intestine without perforation or abscess without bleeding: Secondary | ICD-10-CM | POA: Diagnosis not present

## 2017-02-28 DIAGNOSIS — Z1211 Encounter for screening for malignant neoplasm of colon: Secondary | ICD-10-CM | POA: Diagnosis not present

## 2017-03-05 DIAGNOSIS — N320 Bladder-neck obstruction: Secondary | ICD-10-CM | POA: Diagnosis not present

## 2017-03-06 DIAGNOSIS — Z1211 Encounter for screening for malignant neoplasm of colon: Secondary | ICD-10-CM | POA: Diagnosis not present

## 2017-03-06 DIAGNOSIS — K529 Noninfective gastroenteritis and colitis, unspecified: Secondary | ICD-10-CM | POA: Diagnosis not present

## 2017-03-08 ENCOUNTER — Emergency Department (HOSPITAL_COMMUNITY)
Admission: EM | Admit: 2017-03-08 | Discharge: 2017-03-08 | Disposition: A | Discharge status: Home / Self Care | Payer: Medicare HMO | Attending: Emergency Medicine | Admitting: Emergency Medicine

## 2017-03-08 ENCOUNTER — Encounter (HOSPITAL_COMMUNITY): Payer: Self-pay

## 2017-03-08 DIAGNOSIS — Z960 Presence of urogenital implants: Secondary | ICD-10-CM | POA: Diagnosis not present

## 2017-03-08 DIAGNOSIS — Z5321 Procedure and treatment not carried out due to patient leaving prior to being seen by health care provider: Secondary | ICD-10-CM | POA: Insufficient documentation

## 2017-03-08 NOTE — ED Notes (Signed)
Patient called to be placed in room x3.

## 2017-03-08 NOTE — ED Triage Notes (Signed)
Pt here with problems urinating.  Pt has prostate cancer and has s/p cath.  Pt states no urine output in bag with bladder pain.  However, pt also states that urine is coming from his "rectum".  States happened before and they increased size and changed urinary cath.

## 2017-03-11 DIAGNOSIS — N320 Bladder-neck obstruction: Secondary | ICD-10-CM | POA: Diagnosis not present

## 2017-03-22 ENCOUNTER — Emergency Department (HOSPITAL_COMMUNITY)
Admission: EM | Admit: 2017-03-22 | Discharge: 2017-03-22 | Disposition: A | Discharge status: Home / Self Care | Payer: Medicare HMO | Attending: Emergency Medicine | Admitting: Emergency Medicine

## 2017-03-22 ENCOUNTER — Encounter (HOSPITAL_COMMUNITY): Payer: Self-pay | Admitting: Emergency Medicine

## 2017-03-22 DIAGNOSIS — Z79899 Other long term (current) drug therapy: Secondary | ICD-10-CM | POA: Insufficient documentation

## 2017-03-22 DIAGNOSIS — Z9359 Other cystostomy status: Secondary | ICD-10-CM | POA: Diagnosis not present

## 2017-03-22 DIAGNOSIS — Z87891 Personal history of nicotine dependence: Secondary | ICD-10-CM | POA: Diagnosis not present

## 2017-03-22 DIAGNOSIS — Y658 Other specified misadventures during surgical and medical care: Secondary | ICD-10-CM | POA: Insufficient documentation

## 2017-03-22 DIAGNOSIS — E1165 Type 2 diabetes mellitus with hyperglycemia: Secondary | ICD-10-CM | POA: Diagnosis not present

## 2017-03-22 DIAGNOSIS — T83090A Other mechanical complication of cystostomy catheter, initial encounter: Secondary | ICD-10-CM | POA: Diagnosis present

## 2017-03-22 DIAGNOSIS — I10 Essential (primary) hypertension: Secondary | ICD-10-CM | POA: Insufficient documentation

## 2017-03-22 DIAGNOSIS — T83091A Other mechanical complication of indwelling urethral catheter, initial encounter: Secondary | ICD-10-CM | POA: Diagnosis not present

## 2017-03-22 LAB — BASIC METABOLIC PANEL
ANION GAP: 11 (ref 5–15)
BUN: 17 mg/dL (ref 6–20)
CALCIUM: 9.6 mg/dL (ref 8.9–10.3)
CHLORIDE: 103 mmol/L (ref 101–111)
CO2: 25 mmol/L (ref 22–32)
CREATININE: 1.21 mg/dL (ref 0.61–1.24)
GFR calc non Af Amer: 58 mL/min — ABNORMAL LOW (ref >60)
Glucose, Bld: 112 mg/dL — ABNORMAL HIGH (ref 65–99)
Potassium: 3.7 mmol/L (ref 3.5–5.1)
SODIUM: 139 mmol/L (ref 135–145)

## 2017-03-22 LAB — CBC WITH DIFFERENTIAL/PLATELET
BASOS PCT: 0 %
Basophils Absolute: 0 10*3/uL (ref 0.0–0.1)
EOS ABS: 0.2 10*3/uL (ref 0.0–0.7)
Eosinophils Relative: 1 %
HEMATOCRIT: 28.5 % — AB (ref 39.0–52.0)
HEMOGLOBIN: 9.4 g/dL — AB (ref 13.0–17.0)
LYMPHS ABS: 1.9 10*3/uL (ref 0.7–4.0)
Lymphocytes Relative: 18 %
MCH: 29.1 pg (ref 26.0–34.0)
MCHC: 33 g/dL (ref 30.0–36.0)
MCV: 88.2 fL (ref 78.0–100.0)
MONOS PCT: 5 %
Monocytes Absolute: 0.5 10*3/uL (ref 0.1–1.0)
NEUTROS ABS: 8.2 10*3/uL — AB (ref 1.7–7.7)
NEUTROS PCT: 76 %
Platelets: 452 10*3/uL — ABNORMAL HIGH (ref 150–400)
RBC: 3.23 MIL/uL — AB (ref 4.22–5.81)
RDW: 14.1 % (ref 11.5–15.5)
WBC: 10.7 10*3/uL — AB (ref 4.0–10.5)

## 2017-03-22 NOTE — ED Provider Notes (Signed)
[place] DEPT Provider Note   CSN: [id number] Arrival date & time: 03/22/17  1026     History   Chief Complaint No chief complaint on file.   HPI Ricardo Johnson is a 71 y.o. male with past medical history of alcohol abuse, hypertension, prostate cancer with chronic suprapubic catheter, presenting to the ED with suprapubic catheter problem that began over a week ago. Patient states his catheter stopped draining sometime last weekend, and states the urine has been coming out of his rectum.  He denies abdominal pain, fever, chills, flank pain, or other complaints today. He is followed by [facility], Dr.  Alinda Money. He states he gets the catheter changed every 4 weeks, with last visit about 2 weeks ago. He states during his last visit, they flushed the catheter tubing however did not change it. He states he has been having normal bowel movements with intermittent watery stool versus urine coming from his rectum. Per triage note, patient complaining of issues with a urostomy vs colostomy, however patient does not have a urostomy or colostomy, it is a suprapubic catheter per chart review.  Pertinent review, patient had cystoscopy done in July 2018, and no mention of rectovesicular fistula noted in recommendation.  The history is provided by the patient.    Past Medical History:  Diagnosis Date  . Alcohol abuse   . BPH (benign prostatic hypertrophy) 2000-2001  . Diabetes mellitus without complication ([place])    pt reports that they were checking him during last admission for DM but never started any medicine or made any referrals for F/U  . History of kidney stones   . History of nephrolithiasis   . Hypertension   . Prostate cancer ([place]) 2005  . Tobacco abuse     Patient Active Problem List   Diagnosis Date Noted  . UTI (urinary tract infection) 08/07/2016  . Acute renal failure (ARF) ([place]) 08/07/2016  . Anemia 08/07/2016  . Hyperglycemia 08/07/2016  . Hypokalemia 08/07/2016    . Syncope 08/07/2016  . Pain in the chest   . AKI (acute kidney injury) ([place])   . Noncompliance   . Chest pain 01/25/2015  . Accelerated hypertension 01/25/2015  . ADENOCARCINOMA, PROSTATE 02/23/2010  . ALCOHOL ABUSE 02/23/2010  . TOBACCO ABUSE 02/23/2010  . Essential hypertension 02/23/2010  . SYNCOPE 02/23/2010  . BENIGN PROSTATIC HYPERTROPHY, HX OF 02/23/2010    Past Surgical History:  Procedure Laterality Date  . APPENDECTOMY    . CYSTOSCOPY N/A 08/05/2016   Procedure: CYSTOSCOPY, URETHROSCOPY;  Surgeon: Raynelle Bring, MD;  Location: [facility];  Service: Urology;  Laterality: N/A;  . CYSTOSCOPY N/A 09/05/2016   Procedure: CYSTOSCOPY, URETHROSCOPY, BALLOON DILATION OF SUPRAPUBIC TRACK, PLACEMENT OF 14FR SUPRAPUIC CATHETER;  Surgeon: Raynelle Bring, MD;  [facility]: [facility];  Service: Urology;  Laterality: N/A;  . LITHOTRIPSY    . RADIOACTIVE SEED IMPLANT     for prostate cancer       Home Medications    Prior to Admission medications   Medication Sig Start Date End Date Taking? Authorizing Provider  amLODipine (NORVASC) 10 MG tablet Take 10 mg by mouth at bedtime.  08/25/15  Yes [provider]  folic acid (FOLVITE) 1 MG tablet Take 1 tablet (1 mg total) by mouth daily. 08/11/16  Yes Hongalgi, Lenis Dickinson, MD  levothyroxine (SYNTHROID, LEVOTHROID) 100 MCG tablet Take 100 mcg by mouth daily before breakfast. 07/16/16  Yes [provider]  lisinopril-hydrochlorothiazide (PRINZIDE,ZESTORETIC) 20-25 MG tablet Take 1 tablet by mouth daily.  04/22/16  Yes [provider]  metoprolol succinate (TOPROL-XL) 100 MG 24 hr tablet Take 100 mg by mouth daily. 08/25/15  Yes [provider]  Multiple Vitamin (MULTIVITAMIN WITH MINERALS) TABS tablet Take 1 tablet by mouth daily. 08/11/16  Yes Hongalgi, Lenis Dickinson, MD  Tamsulosin HCl (FLOMAX) 0.4 MG CAPS TAKE ONE CAPSULE BY MOUTH EVERY DAY 11/27/10  Yes Kalia-Reynolds, Maitri S, DO  thiamine 100 MG tablet Take 1 tablet (100 mg  total) by mouth daily. 08/11/16  Yes Hongalgi, Lenis Dickinson, MD  finasteride (PROSCAR) 5 MG tablet TAKE 1 TABLET BY MOUTH EVERY DAY Patient not taking: Reported on 03/22/2017 01/04/11   Annamarie Dawley, DO    Family History Family History  Problem Relation Age of Onset  . Heart disease Mother   . Heart disease Father   . Cancer Cousin     Social History Social History  Substance Use Topics  . Smoking status: Former Smoker    Quit date: 01/30/2015  . Smokeless tobacco: Never Used  . Alcohol use No     Comment: "not since i got sick "     Allergies   Patient has no known allergies.   Review of Systems Review of Systems  Constitutional: Negative for chills and fever.  Gastrointestinal: Negative for abdominal pain, nausea and vomiting.       Watery and normal stools  Genitourinary: Positive for decreased urine volume. Negative for flank pain.  All other systems reviewed and are negative.  Physical Exam Updated Vital Signs BP (!) 132/91   Pulse 82   Temp 98 F (36.7 C) (Oral)   Resp 18   SpO2 100%   Physical Exam  Constitutional: He appears well-developed and well-nourished. No distress.  HENT:  Head: Normocephalic and atraumatic.  Eyes: Conjunctivae are normal.  Cardiovascular: Regular rhythm, normal heart sounds and intact distal pulses.   Pulmonary/Chest: Effort normal and breath sounds normal.  Abdominal: Soft. Bowel sounds are normal. He exhibits no distension. There is no tenderness. There is no rebound and no guarding.  Suprapubic catheter appears in place. Site is nontender, without erythema or purulent drainage.  Neurological: He is alert.  Skin: Skin is warm.  Psychiatric: He has a normal mood and affect. His behavior is normal.  Nursing note and vitals reviewed.  ED Treatments / Results  Labs (all labs ordered are listed, but only abnormal results are displayed) Labs Reviewed  BASIC METABOLIC PANEL - Abnormal; Notable for the following:        Result Value   Glucose, Bld 112 (*)    GFR calc non Af Amer 58 (*)    All other components within normal limits  CBC WITH DIFFERENTIAL/PLATELET - Abnormal; Notable for the following:    WBC 10.7 (*)    RBC 3.23 (*)    Hemoglobin 9.4 (*)    HCT 28.5 (*)    Platelets 452 (*)    Neutro Abs 8.2 (*)    All other components within normal limits    EKG  EKG Interpretation None       Radiology No results found.  Procedures Procedures (including critical care time)  Medications Ordered in ED Medications - No data to display   Initial Impression / Assessment and Plan / ED Course  I have reviewed the triage vital signs and the nursing notes.  Pertinent labs & imaging results that were available during my care of the patient were reviewed by me and considered in my medical decision  making (see chart for details).     Patient presenting for suprapubic catheter not draining 1 week, with urine draining through his rectum. Suspect possible rectovesicular fistula to account for urine per rectum. On exam, catheter in place. Nursing flushed catheter with good return. Afebrile, nontoxic. Normal renal function. Hemoglobin is slightly down from baseline to 9.4. Patient denies melena or symptoms of anemia. There was no urine in bladder for urinalysis. Spoke with Dr. Diona Foley with [facility], who reports patient's urologist Dr. Alinda Money was suspicious for fistula. Dr. Diona Foley recommending patient is safe for discharge with urology follow-up if catheter is flushing properly, and labs are within normal limits; he does no recommend CT at this time. Ptwill be discharged with recommendation to follow-up closely with urology. Strict return precautions discussed. Patient is safe for discharge.  Patient discussed with and seen by Dr. Tamera Punt.  Discussed results, findings, treatment and follow up. Patient advised of return precautions. Patient verbalized understanding and agreed with plan.  Final Clinical  Impressions(s) / ED Diagnoses   Final diagnoses:  Suprapubic catheter [facility])    New Prescriptions New Prescriptions   No medications on file     Russo, [place] N, PA-C 03/22/17 1416    Malvin Johns, MD 03/22/17 1430

## 2017-03-22 NOTE — ED Triage Notes (Signed)
Pt reports his urostomy is blocked and urine is coming out of his rectum. This has been going on for a few days.

## 2017-03-22 NOTE — ED Notes (Signed)
Bladder scan showed 53 mL. Rn notified.

## 2017-03-22 NOTE — ED Notes (Signed)
Unsuccessful lab work collection attempt.

## 2017-03-22 NOTE — ED Notes (Signed)
ED Provider at bedside. 

## 2017-03-22 NOTE — ED Notes (Signed)
Patient's suprapubic catheter flushed with normal saline without difficulty. Urine return noted to drainage bag after catheter was flushed.

## 2017-03-22 NOTE — Discharge Instructions (Signed)
Please schedule an appointment with your urologist to follow up on your visit today. Your catheter appears to be working properly, however there may be other underlying problems that need to be addressed by your urologist. Return to the ER for fever, chills, pain in your sides, nausea/vomiting, or other new or concerning symptoms.

## 2017-03-28 DIAGNOSIS — N320 Bladder-neck obstruction: Secondary | ICD-10-CM | POA: Diagnosis not present

## 2017-03-28 DIAGNOSIS — C61 Malignant neoplasm of prostate: Secondary | ICD-10-CM | POA: Diagnosis not present

## 2017-03-31 ENCOUNTER — Encounter: Payer: Self-pay | Admitting: Surgery

## 2017-03-31 DIAGNOSIS — Z9359 Other cystostomy status: Secondary | ICD-10-CM | POA: Insufficient documentation

## 2017-03-31 DIAGNOSIS — C61 Malignant neoplasm of prostate: Secondary | ICD-10-CM | POA: Diagnosis not present

## 2017-03-31 DIAGNOSIS — K6289 Other specified diseases of anus and rectum: Secondary | ICD-10-CM | POA: Insufficient documentation

## 2017-03-31 DIAGNOSIS — N360 Urethral fistula: Secondary | ICD-10-CM | POA: Insufficient documentation

## 2017-03-31 DIAGNOSIS — G8929 Other chronic pain: Secondary | ICD-10-CM | POA: Diagnosis not present

## 2017-04-03 DIAGNOSIS — Z87891 Personal history of nicotine dependence: Secondary | ICD-10-CM | POA: Diagnosis not present

## 2017-04-03 DIAGNOSIS — Z923 Personal history of irradiation: Secondary | ICD-10-CM | POA: Diagnosis not present

## 2017-04-03 DIAGNOSIS — N360 Urethral fistula: Secondary | ICD-10-CM | POA: Diagnosis not present

## 2017-04-03 DIAGNOSIS — N529 Male erectile dysfunction, unspecified: Secondary | ICD-10-CM | POA: Diagnosis not present

## 2017-04-03 DIAGNOSIS — Z8546 Personal history of malignant neoplasm of prostate: Secondary | ICD-10-CM | POA: Diagnosis not present

## 2017-04-03 DIAGNOSIS — I10 Essential (primary) hypertension: Secondary | ICD-10-CM | POA: Diagnosis not present

## 2017-04-03 DIAGNOSIS — C61 Malignant neoplasm of prostate: Secondary | ICD-10-CM | POA: Diagnosis not present

## 2017-04-03 DIAGNOSIS — E785 Hyperlipidemia, unspecified: Secondary | ICD-10-CM | POA: Diagnosis not present

## 2017-04-07 ENCOUNTER — Other Ambulatory Visit (HOSPITAL_COMMUNITY): Payer: Self-pay | Admitting: Urology

## 2017-04-07 DIAGNOSIS — N360 Urethral fistula: Secondary | ICD-10-CM

## 2017-04-10 ENCOUNTER — Ambulatory Visit (HOSPITAL_COMMUNITY): Payer: Medicare HMO

## 2017-04-11 ENCOUNTER — Encounter (HOSPITAL_COMMUNITY): Payer: Self-pay

## 2017-04-11 ENCOUNTER — Ambulatory Visit (HOSPITAL_COMMUNITY)
Admission: RE | Admit: 2017-04-11 | Discharge: 2017-04-11 | Disposition: A | Discharge status: Home / Self Care | Payer: Medicare HMO | Source: Ambulatory Visit | Attending: Urology | Admitting: Urology

## 2017-04-11 DIAGNOSIS — N200 Calculus of kidney: Secondary | ICD-10-CM | POA: Insufficient documentation

## 2017-04-11 DIAGNOSIS — K5730 Diverticulosis of large intestine without perforation or abscess without bleeding: Secondary | ICD-10-CM | POA: Insufficient documentation

## 2017-04-11 DIAGNOSIS — N360 Urethral fistula: Secondary | ICD-10-CM | POA: Insufficient documentation

## 2017-04-11 DIAGNOSIS — I700 Atherosclerosis of aorta: Secondary | ICD-10-CM | POA: Diagnosis not present

## 2017-04-11 DIAGNOSIS — J439 Emphysema, unspecified: Secondary | ICD-10-CM | POA: Diagnosis not present

## 2017-04-11 DIAGNOSIS — N412 Abscess of prostate: Secondary | ICD-10-CM | POA: Insufficient documentation

## 2017-04-11 MED ORDER — IOPAMIDOL (ISOVUE-300) INJECTION 61%
100.0000 mL | Freq: Once | INTRAVENOUS | Status: AC | PRN
  Administered 2017-04-11: 100 mL via INTRAVENOUS

## 2017-04-11 MED ORDER — IOPAMIDOL (ISOVUE-300) INJECTION 61%
INTRAVENOUS | Status: AC
  Filled 2017-04-11: qty 100

## 2017-04-15 DIAGNOSIS — Z87891 Personal history of nicotine dependence: Secondary | ICD-10-CM | POA: Diagnosis not present

## 2017-04-15 DIAGNOSIS — I10 Essential (primary) hypertension: Secondary | ICD-10-CM | POA: Diagnosis not present

## 2017-04-15 DIAGNOSIS — N360 Urethral fistula: Secondary | ICD-10-CM | POA: Diagnosis not present

## 2017-04-15 DIAGNOSIS — N321 Vesicointestinal fistula: Secondary | ICD-10-CM | POA: Diagnosis not present

## 2017-04-15 DIAGNOSIS — K660 Peritoneal adhesions (postprocedural) (postinfection): Secondary | ICD-10-CM | POA: Diagnosis not present

## 2017-04-15 DIAGNOSIS — K5730 Diverticulosis of large intestine without perforation or abscess without bleeding: Secondary | ICD-10-CM | POA: Diagnosis not present

## 2017-04-15 DIAGNOSIS — N3289 Other specified disorders of bladder: Secondary | ICD-10-CM | POA: Diagnosis not present

## 2017-04-15 DIAGNOSIS — R9341 Abnormal radiologic findings on diagnostic imaging of renal pelvis, ureter, or bladder: Secondary | ICD-10-CM | POA: Diagnosis not present

## 2017-04-15 DIAGNOSIS — I499 Cardiac arrhythmia, unspecified: Secondary | ICD-10-CM | POA: Diagnosis not present

## 2017-04-15 DIAGNOSIS — E785 Hyperlipidemia, unspecified: Secondary | ICD-10-CM | POA: Diagnosis not present

## 2017-04-15 DIAGNOSIS — K56609 Unspecified intestinal obstruction, unspecified as to partial versus complete obstruction: Secondary | ICD-10-CM | POA: Diagnosis not present

## 2017-04-15 DIAGNOSIS — E43 Unspecified severe protein-calorie malnutrition: Secondary | ICD-10-CM | POA: Diagnosis not present

## 2017-04-15 DIAGNOSIS — Z681 Body mass index (BMI) 19 or less, adult: Secondary | ICD-10-CM | POA: Diagnosis not present

## 2017-04-15 DIAGNOSIS — Z8546 Personal history of malignant neoplasm of prostate: Secondary | ICD-10-CM | POA: Diagnosis not present

## 2017-04-15 DIAGNOSIS — N529 Male erectile dysfunction, unspecified: Secondary | ICD-10-CM | POA: Diagnosis not present

## 2017-04-15 DIAGNOSIS — R140 Abdominal distension (gaseous): Secondary | ICD-10-CM | POA: Diagnosis not present

## 2017-04-15 DIAGNOSIS — Z4682 Encounter for fitting and adjustment of non-vascular catheter: Secondary | ICD-10-CM | POA: Diagnosis not present

## 2017-04-15 DIAGNOSIS — Z008 Encounter for other general examination: Secondary | ICD-10-CM | POA: Diagnosis not present

## 2017-05-07 DIAGNOSIS — E039 Hypothyroidism, unspecified: Secondary | ICD-10-CM | POA: Diagnosis not present

## 2017-05-07 DIAGNOSIS — C61 Malignant neoplasm of prostate: Secondary | ICD-10-CM | POA: Diagnosis not present

## 2017-05-07 DIAGNOSIS — E785 Hyperlipidemia, unspecified: Secondary | ICD-10-CM | POA: Diagnosis not present

## 2017-05-07 DIAGNOSIS — Z433 Encounter for attention to colostomy: Secondary | ICD-10-CM | POA: Diagnosis not present

## 2017-05-07 DIAGNOSIS — I10 Essential (primary) hypertension: Secondary | ICD-10-CM | POA: Diagnosis not present

## 2017-05-07 DIAGNOSIS — Z48814 Encounter for surgical aftercare following surgery on the teeth or oral cavity: Secondary | ICD-10-CM | POA: Diagnosis not present

## 2017-05-12 DIAGNOSIS — Z433 Encounter for attention to colostomy: Secondary | ICD-10-CM | POA: Diagnosis not present

## 2017-05-12 DIAGNOSIS — E039 Hypothyroidism, unspecified: Secondary | ICD-10-CM | POA: Diagnosis not present

## 2017-05-12 DIAGNOSIS — E785 Hyperlipidemia, unspecified: Secondary | ICD-10-CM | POA: Diagnosis not present

## 2017-05-12 DIAGNOSIS — C61 Malignant neoplasm of prostate: Secondary | ICD-10-CM | POA: Diagnosis not present

## 2017-05-12 DIAGNOSIS — Z48814 Encounter for surgical aftercare following surgery on the teeth or oral cavity: Secondary | ICD-10-CM | POA: Diagnosis not present

## 2017-05-12 DIAGNOSIS — I10 Essential (primary) hypertension: Secondary | ICD-10-CM | POA: Diagnosis not present

## 2017-05-15 DIAGNOSIS — Z48814 Encounter for surgical aftercare following surgery on the teeth or oral cavity: Secondary | ICD-10-CM | POA: Diagnosis not present

## 2017-05-15 DIAGNOSIS — Z433 Encounter for attention to colostomy: Secondary | ICD-10-CM | POA: Diagnosis not present

## 2017-05-15 DIAGNOSIS — I10 Essential (primary) hypertension: Secondary | ICD-10-CM | POA: Diagnosis not present

## 2017-05-15 DIAGNOSIS — E039 Hypothyroidism, unspecified: Secondary | ICD-10-CM | POA: Diagnosis not present

## 2017-05-15 DIAGNOSIS — E785 Hyperlipidemia, unspecified: Secondary | ICD-10-CM | POA: Diagnosis not present

## 2017-05-15 DIAGNOSIS — C61 Malignant neoplasm of prostate: Secondary | ICD-10-CM | POA: Diagnosis not present

## 2017-05-22 DIAGNOSIS — N529 Male erectile dysfunction, unspecified: Secondary | ICD-10-CM | POA: Diagnosis not present

## 2017-05-22 DIAGNOSIS — N360 Urethral fistula: Secondary | ICD-10-CM | POA: Diagnosis not present

## 2017-05-22 DIAGNOSIS — Z436 Encounter for attention to other artificial openings of urinary tract: Secondary | ICD-10-CM | POA: Diagnosis not present

## 2017-05-22 DIAGNOSIS — R000 Tachycardia, unspecified: Secondary | ICD-10-CM | POA: Diagnosis not present

## 2017-05-22 DIAGNOSIS — N320 Bladder-neck obstruction: Secondary | ICD-10-CM | POA: Diagnosis not present

## 2017-05-22 DIAGNOSIS — R42 Dizziness and giddiness: Secondary | ICD-10-CM | POA: Diagnosis not present

## 2017-05-22 DIAGNOSIS — R339 Retention of urine, unspecified: Secondary | ICD-10-CM | POA: Diagnosis not present

## 2017-05-23 DIAGNOSIS — Z433 Encounter for attention to colostomy: Secondary | ICD-10-CM | POA: Diagnosis not present

## 2017-05-23 DIAGNOSIS — Z48814 Encounter for surgical aftercare following surgery on the teeth or oral cavity: Secondary | ICD-10-CM | POA: Diagnosis not present

## 2017-05-23 DIAGNOSIS — E785 Hyperlipidemia, unspecified: Secondary | ICD-10-CM | POA: Diagnosis not present

## 2017-05-23 DIAGNOSIS — I10 Essential (primary) hypertension: Secondary | ICD-10-CM | POA: Diagnosis not present

## 2017-05-23 DIAGNOSIS — C61 Malignant neoplasm of prostate: Secondary | ICD-10-CM | POA: Diagnosis not present

## 2017-05-23 DIAGNOSIS — E039 Hypothyroidism, unspecified: Secondary | ICD-10-CM | POA: Diagnosis not present

## 2017-05-28 DIAGNOSIS — I10 Essential (primary) hypertension: Secondary | ICD-10-CM | POA: Diagnosis not present

## 2017-05-28 DIAGNOSIS — E039 Hypothyroidism, unspecified: Secondary | ICD-10-CM | POA: Diagnosis not present

## 2017-05-28 DIAGNOSIS — Z48814 Encounter for surgical aftercare following surgery on the teeth or oral cavity: Secondary | ICD-10-CM | POA: Diagnosis not present

## 2017-05-28 DIAGNOSIS — C61 Malignant neoplasm of prostate: Secondary | ICD-10-CM | POA: Diagnosis not present

## 2017-05-28 DIAGNOSIS — E785 Hyperlipidemia, unspecified: Secondary | ICD-10-CM | POA: Diagnosis not present

## 2017-05-28 DIAGNOSIS — Z433 Encounter for attention to colostomy: Secondary | ICD-10-CM | POA: Diagnosis not present

## 2017-05-30 DIAGNOSIS — E7849 Other hyperlipidemia: Secondary | ICD-10-CM | POA: Diagnosis not present

## 2017-05-30 DIAGNOSIS — E1165 Type 2 diabetes mellitus with hyperglycemia: Secondary | ICD-10-CM | POA: Diagnosis not present

## 2017-05-30 DIAGNOSIS — E559 Vitamin D deficiency, unspecified: Secondary | ICD-10-CM | POA: Diagnosis not present

## 2017-05-30 DIAGNOSIS — I10 Essential (primary) hypertension: Secondary | ICD-10-CM | POA: Diagnosis not present

## 2017-05-30 DIAGNOSIS — N183 Chronic kidney disease, stage 3 (moderate): Secondary | ICD-10-CM | POA: Diagnosis not present

## 2017-05-30 DIAGNOSIS — M109 Gout, unspecified: Secondary | ICD-10-CM | POA: Diagnosis not present

## 2017-05-30 DIAGNOSIS — E039 Hypothyroidism, unspecified: Secondary | ICD-10-CM | POA: Diagnosis not present

## 2017-05-30 DIAGNOSIS — E7211 Homocystinuria: Secondary | ICD-10-CM | POA: Diagnosis not present

## 2017-05-30 DIAGNOSIS — Z125 Encounter for screening for malignant neoplasm of prostate: Secondary | ICD-10-CM | POA: Diagnosis not present

## 2017-05-30 DIAGNOSIS — Z720 Tobacco use: Secondary | ICD-10-CM | POA: Diagnosis not present

## 2017-05-30 DIAGNOSIS — Z0110 Encounter for examination of ears and hearing without abnormal findings: Secondary | ICD-10-CM | POA: Diagnosis not present

## 2017-06-04 ENCOUNTER — Other Ambulatory Visit: Payer: Self-pay

## 2017-06-04 ENCOUNTER — Emergency Department (HOSPITAL_COMMUNITY)
Admission: EM | Admit: 2017-06-04 | Discharge: 2017-06-05 | Disposition: A | Discharge status: Home / Self Care | Payer: Medicare HMO | Attending: Emergency Medicine | Admitting: Emergency Medicine

## 2017-06-04 ENCOUNTER — Encounter (HOSPITAL_COMMUNITY): Payer: Self-pay

## 2017-06-04 DIAGNOSIS — Z8546 Personal history of malignant neoplasm of prostate: Secondary | ICD-10-CM | POA: Diagnosis not present

## 2017-06-04 DIAGNOSIS — I10 Essential (primary) hypertension: Secondary | ICD-10-CM | POA: Insufficient documentation

## 2017-06-04 DIAGNOSIS — T83098A Other mechanical complication of other indwelling urethral catheter, initial encounter: Secondary | ICD-10-CM | POA: Diagnosis not present

## 2017-06-04 DIAGNOSIS — Z87891 Personal history of nicotine dependence: Secondary | ICD-10-CM | POA: Diagnosis not present

## 2017-06-04 DIAGNOSIS — E119 Type 2 diabetes mellitus without complications: Secondary | ICD-10-CM | POA: Diagnosis not present

## 2017-06-04 DIAGNOSIS — Y658 Other specified misadventures during surgical and medical care: Secondary | ICD-10-CM | POA: Insufficient documentation

## 2017-06-04 DIAGNOSIS — Z9359 Other cystostomy status: Secondary | ICD-10-CM

## 2017-06-04 DIAGNOSIS — Z79899 Other long term (current) drug therapy: Secondary | ICD-10-CM | POA: Diagnosis not present

## 2017-06-04 DIAGNOSIS — T83198A Other mechanical complication of other urinary devices and implants, initial encounter: Secondary | ICD-10-CM | POA: Diagnosis not present

## 2017-06-04 LAB — BASIC METABOLIC PANEL
ANION GAP: 9 (ref 5–15)
BUN: 23 mg/dL — AB (ref 6–20)
CO2: 22 mmol/L (ref 22–32)
Calcium: 10.5 mg/dL — ABNORMAL HIGH (ref 8.9–10.3)
Chloride: 106 mmol/L (ref 101–111)
Creatinine, Ser: 1.14 mg/dL (ref 0.61–1.24)
Glucose, Bld: 136 mg/dL — ABNORMAL HIGH (ref 65–99)
POTASSIUM: 3.6 mmol/L (ref 3.5–5.1)
SODIUM: 137 mmol/L (ref 135–145)

## 2017-06-04 LAB — CBC WITH DIFFERENTIAL/PLATELET
BASOS ABS: 0 10*3/uL (ref 0.0–0.1)
BASOS PCT: 0 %
EOS ABS: 0.1 10*3/uL (ref 0.0–0.7)
EOS PCT: 1 %
HCT: 36.8 % — ABNORMAL LOW (ref 39.0–52.0)
Hemoglobin: 12.3 g/dL — ABNORMAL LOW (ref 13.0–17.0)
Lymphocytes Relative: 21 %
Lymphs Abs: 2.1 10*3/uL (ref 0.7–4.0)
MCH: 29.3 pg (ref 26.0–34.0)
MCHC: 33.4 g/dL (ref 30.0–36.0)
MCV: 87.6 fL (ref 78.0–100.0)
Monocytes Absolute: 0.7 10*3/uL (ref 0.1–1.0)
Monocytes Relative: 7 %
NEUTROS PCT: 71 %
Neutro Abs: 7 10*3/uL (ref 1.7–7.7)
PLATELETS: 259 10*3/uL (ref 150–400)
RBC: 4.2 MIL/uL — AB (ref 4.22–5.81)
RDW: 15.2 % (ref 11.5–15.5)
WBC: 9.9 10*3/uL (ref 4.0–10.5)

## 2017-06-04 MED ORDER — SODIUM CHLORIDE 0.9 % IV BOLUS (SEPSIS)
1000.0000 mL | Freq: Once | INTRAVENOUS | Status: AC
  Administered 2017-06-04: 1000 mL via INTRAVENOUS

## 2017-06-04 NOTE — ED Triage Notes (Signed)
Patient reports that he has had a very small amount of urine from his suprapubic cath x 3 days.

## 2017-06-04 NOTE — ED Provider Notes (Signed)
[place] DEPT Provider Note   CSN: [id number] Arrival date & time: 06/04/17  0719     History   Chief Complaint Chief Complaint  Patient presents with  . suprapubic cath issues    HPI Ricardo Johnson is a 71 y.o. male. CC:  No urine and suprapubic catheter or Foley bag for 2-3 days  HPI:  71 year old male. Has colostomy, and suprapubic catheter. Has history of prostate cancer status post radiation.  Follows with Dr. Alinda Money of the [facility] urology. 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 Became unable to urinate. They were unable to dilator passed catheter through your urethra. Thus has suprapubic catheter.  Has known vesicle rectal fistula. Has had decreased urine through his catheter at times in the past.  For the last 2 days he has felt well. Continues to have ostomy output. Has not had urine in his bag for 2 days. No swelling. No shortness of breath. No nausea weakness or other symptoms.  Past Medical History:  Diagnosis Date  . Alcohol abuse   . BPH (benign prostatic hypertrophy) 2000-2001  . Diabetes mellitus without complication ([place])    pt reports that they were checking him during last admission for DM but never started any medicine or made any referrals for F/U  . History of kidney stones   . History of nephrolithiasis   . Hypertension   . Prostate cancer ([place]) 2005  . Tobacco abuse     Patient Active Problem List   Diagnosis Date Noted  . 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 Suprapubic catheter in place 03/31/2017  . Rectal pain 03/31/2017  . UTI (urinary tract infection) 08/07/2016  . Acute renal failure (ARF) ([place]) 08/07/2016  . Anemia 08/07/2016  . Hyperglycemia 08/07/2016  . Hypokalemia 08/07/2016  . Syncope 08/07/2016  . Pain in the chest   . AKI (acute kidney injury) ([place])   . Noncompliance   . Chest pain 01/25/2015  . Accelerated hypertension 01/25/2015  .  ADENOCARCINOMA, PROSTATE 02/23/2010  . ALCOHOL ABUSE 02/23/2010  . TOBACCO ABUSE 02/23/2010  . Essential hypertension 02/23/2010  . SYNCOPE 02/23/2010  . BENIGN PROSTATIC HYPERTROPHY, HX OF 02/23/2010    Past Surgical History:  Procedure Laterality Date  . APPENDECTOMY    . CYSTOSCOPY N/A 08/05/2016   Procedure: CYSTOSCOPY, URETHROSCOPY;  Surgeon: Raynelle Bring, MD;  [facility];  Service: Urology;  Laterality: N/A;  . CYSTOSCOPY N/A 09/05/2016   Procedure: CYSTOSCOPY, URETHROSCOPY, BALLOON DILATION OF SUPRAPUBIC TRACK, PLACEMENT OF 14FR SUPRAPUIC CATHETER;  Surgeon: Raynelle Bring, MD;  [facility];  Service: Urology;  Laterality: N/A;  . LITHOTRIPSY    . RADIOACTIVE SEED IMPLANT     for prostate cancer       Home Medications    Prior to Admission medications   Medication Sig Start Date End Date Taking? Authorizing Provider  amLODipine (NORVASC) 10 MG tablet Take 10 mg by mouth at bedtime.  08/25/15   [provider]  finasteride (PROSCAR) 5 MG tablet TAKE 1 TABLET BY MOUTH EVERY DAY Patient not taking: Reported on 03/22/2017 01/04/11   Annamarie Dawley, DO  folic acid (FOLVITE) 1 MG tablet Take 1 tablet (1 mg total) by mouth daily. 08/11/16   Modena Jansky, MD  levothyroxine (SYNTHROID, LEVOTHROID) 100 MCG tablet Take 100 mcg by mouth daily before breakfast. 07/16/16   [provider]  lisinopril-hydrochlorothiazide (PRINZIDE,ZESTORETIC) 20-25 MG tablet Take 1 tablet by mouth daily. 04/22/16  [provider]  metoprolol succinate (TOPROL-XL) 100 MG 24 hr tablet Take 100 mg by mouth daily. 08/25/15   [provider]  Multiple Vitamin (MULTIVITAMIN WITH MINERALS) TABS tablet Take 1 tablet by mouth daily. 08/11/16   Hongalgi, Lenis Dickinson, MD  Tamsulosin HCl (FLOMAX) 0.4 MG CAPS TAKE ONE CAPSULE BY MOUTH EVERY DAY 11/27/10   Kalia-Reynolds, Maitri S, DO  thiamine 100 MG tablet Take 1 tablet (100 mg total) by mouth daily. 08/11/16   Modena Jansky, MD    Family History Family History  Problem Relation Age of Onset  . Heart disease Mother   . Heart disease Father   . Cancer Cousin     Social History Social History   Tobacco Use  . Smoking status: Former Smoker    Last attempt to quit: 01/30/2015    Years since quitting: 2.3  . Smokeless tobacco: Never Used  Substance Use Topics  . Alcohol use: No    Alcohol/week: 1.2 oz    Types: 1 Cans of beer, 1 Shots of liquor per week    Comment: "not since i got sick "  . Drug use: No     Allergies   Patient has no known allergies.   Review of Systems Review of Systems  Constitutional: Negative for appetite change, chills, diaphoresis, fatigue and fever.  HENT: Negative for mouth sores, sore throat and trouble swallowing.   Eyes: Negative for visual disturbance.  Respiratory: Negative for cough, chest tightness, shortness of breath and wheezing.   Cardiovascular: Negative for chest pain.  Gastrointestinal: Negative for abdominal distention, abdominal pain, diarrhea, nausea and vomiting.  Endocrine: Negative for polydipsia, polyphagia and polyuria.  Genitourinary: Positive for decreased urine volume. Negative for dysuria, frequency and hematuria.  Musculoskeletal: Negative for gait problem.  Skin: Negative for color change, pallor and rash.  Neurological: Negative for dizziness, syncope, light-headedness and headaches.  Hematological: Does not bruise/bleed easily.  Psychiatric/Behavioral: Negative for behavioral problems and confusion.     Physical Exam Updated Vital Signs BP 106/82 (BP Location: Left Arm)   Pulse 90   Temp 97.6 F (36.4 C) (Oral)   Resp 14   Ht 5\' 6"  (1.676 m)   Wt 65.8 kg (145 lb)   SpO2 93%   BMI 23.40 kg/m   Physical Exam  Constitutional: He is oriented to person, place, and time. He appears well-developed and well-nourished. No distress.  HENT:  Head: Normocephalic.  Eyes: Conjunctivae are normal. Pupils are equal, round, and  reactive to light. No scleral icterus.  Neck: Normal range of motion. Neck supple. No thyromegaly present.  Cardiovascular: Normal rate and regular rhythm. Exam reveals no gallop and no friction rub.  No murmur heard. Pulmonary/Chest: Effort normal and breath sounds normal. No respiratory distress. He has no wheezes. He has no rales.  Abdominal: Soft. Bowel sounds are normal. He exhibits no distension. There is no tenderness. There is no rebound.  Genitourinary:  Genitourinary Comments: Has midline suprapubic catheter. Has left lower quadrant colostomy. Ostomy bag has stool and liquid. Foley bag is empty. Site superior cath appears intact. Catheter is mobile within the tract.  Musculoskeletal: Normal range of motion.  Neurological: He is alert and oriented to person, place, and time.  Skin: Skin is warm and dry. No rash noted.  Psychiatric: He has a normal mood and affect. His behavior is normal.     ED Treatments / Results  Labs (all labs ordered are listed, but only abnormal results are displayed)  Labs Reviewed  CBC WITH DIFFERENTIAL/PLATELET - Abnormal; Notable for the following components:      Result Value   RBC 4.20 (*)    Hemoglobin 12.3 (*)    HCT 36.8 (*)    All other components within normal limits  BASIC METABOLIC PANEL - Abnormal; Notable for the following components:   Glucose, Bld 136 (*)    BUN 23 (*)    Calcium 10.5 (*)    All other components within normal limits  URINE CULTURE    EKG  EKG Interpretation None       Radiology No results found.  Procedures Procedures (including critical care time)  Medications Ordered in ED Medications  sodium chloride 0.9 % bolus 1,000 mL (1,000 mLs Intravenous New Bag/Given 06/04/17 0905)     Initial Impression / Assessment and Plan / ED Course  I have reviewed the triage vital signs and the nursing notes.  Pertinent labs & imaging results that were available during my care of the patient were reviewed by me and  considered in my medical decision making (see chart for details).    Under aseptic technique the ROOM was removed and a same size 16 [place] Foley was placed by myself without difficulty. Prior to this with bedside ultrasound there is no apparent urine within the bladder. After passage of the tube balloon is inflated without resistance. No additional urine is noted.  Discussion: He may simply have fistulous track as described before where his urine is leaving the bladder through his fistula to the rectum and passed into his ostomy. We will plan some IV fluids. He feels he has been hydrating well. Will check kidney function.  Final Clinical Impressions(s) / ED Diagnoses   Final diagnoses:  Suprapubic catheter [facility])    Per review of patient's chart he underwent diverting colostomy in October. Continues to have normal output. He occasionally does put out what looks like urine through his rectum. The quit only. This is been going on more than usual over the last several days. His creatinine is normal. No additional interventions or testing required at this time. Plan will be discharge home. Routine follow-up with urology. I canceled urinalysis. Urine cultures obtained. He can follow-up with urology regarding this. On his any abnormalities that need addressed we will contact him with.  ED Discharge Orders    None       Tanna Furry, MD 06/04/17 1011

## 2017-06-04 NOTE — ED Notes (Signed)
Will attempt to collect urine sample after patient has received adequate amount of fluids to provide one.

## 2017-06-04 NOTE — Discharge Instructions (Signed)
Routine follow up with your urologist.

## 2017-06-06 LAB — URINE CULTURE

## 2017-06-07 ENCOUNTER — Telehealth: Payer: Self-pay

## 2017-06-07 NOTE — Telephone Encounter (Signed)
Called for symptom check. No fever, pain. Catheter doing fine. Reminded to F/U with Uro. physician

## 2017-06-07 NOTE — Progress Notes (Signed)
ED Antimicrobial Stewardship Positive Culture Follow Up   Ricardo Johnson is an 71 y.o. male who presented to [facility] on 06/04/2017 with a chief complaint of  Chief Complaint  Patient presents with  . suprapubic cath issues    Recent Results (from the past 720 hour(s))  Urine Culture     Status: Abnormal   Collection Time: 06/04/17 10:09 AM  Result Value Ref Range Status   Specimen Description URINE, CATHETERIZED  Final   Special Requests NONE  Final   Culture >=100,000 COLONIES/mL ENTEROCOCCUS FAECALIS (A)  Final   Report Status 06/06/2017 FINAL  Final   Organism ID, Bacteria ENTEROCOCCUS FAECALIS (A)  Final      Susceptibility   Enterococcus faecalis - MIC*    AMPICILLIN <=2 SENSITIVE Sensitive     LEVOFLOXACIN 1 SENSITIVE Sensitive     NITROFURANTOIN <=16 SENSITIVE Sensitive     VANCOMYCIN 1 SENSITIVE Sensitive     * >=100,000 COLONIES/mL ENTEROCOCCUS FAECALIS    []  Treated with N/A, organism resistant to prescribed antimicrobial [x]  Patient discharged originally without antimicrobial agent and treatment is now indicated  New antibiotic prescription: If symptomatic, start amoxicillin 500mg  PO Q8H x 10 days. Pt needs to follow-up with urology  ED Provider: Wyn Quaker, PA   [place], [name] 06/07/2017, 10:43 AM Clinical Pharmacist Phone# [phone]

## 2017-06-11 DIAGNOSIS — I10 Essential (primary) hypertension: Secondary | ICD-10-CM | POA: Diagnosis not present

## 2017-06-11 DIAGNOSIS — E1165 Type 2 diabetes mellitus with hyperglycemia: Secondary | ICD-10-CM | POA: Diagnosis not present

## 2017-06-18 ENCOUNTER — Emergency Department (HOSPITAL_COMMUNITY): Admission: EM | Admit: 2017-06-18 | Discharge: 2017-06-18 | Discharge status: Absent without leave | Payer: Medicare HMO

## 2017-06-18 DIAGNOSIS — N320 Bladder-neck obstruction: Secondary | ICD-10-CM | POA: Diagnosis not present

## 2017-07-01 DIAGNOSIS — R9341 Abnormal radiologic findings on diagnostic imaging of renal pelvis, ureter, or bladder: Secondary | ICD-10-CM | POA: Diagnosis not present

## 2017-07-01 DIAGNOSIS — N3289 Other specified disorders of bladder: Secondary | ICD-10-CM | POA: Diagnosis not present

## 2017-07-01 DIAGNOSIS — C61 Malignant neoplasm of prostate: Secondary | ICD-10-CM | POA: Diagnosis not present

## 2017-07-01 DIAGNOSIS — R509 Fever, unspecified: Secondary | ICD-10-CM | POA: Diagnosis not present

## 2017-07-01 DIAGNOSIS — Z8546 Personal history of malignant neoplasm of prostate: Secondary | ICD-10-CM | POA: Diagnosis not present

## 2017-07-01 DIAGNOSIS — R102 Pelvic and perineal pain: Secondary | ICD-10-CM | POA: Diagnosis not present

## 2017-07-01 DIAGNOSIS — K651 Peritoneal abscess: Secondary | ICD-10-CM | POA: Diagnosis not present

## 2017-07-01 DIAGNOSIS — Z87891 Personal history of nicotine dependence: Secondary | ICD-10-CM | POA: Diagnosis not present

## 2017-07-01 DIAGNOSIS — N368 Other specified disorders of urethra: Secondary | ICD-10-CM | POA: Diagnosis not present

## 2017-07-01 DIAGNOSIS — S20351A Superficial foreign body of right front wall of thorax, initial encounter: Secondary | ICD-10-CM | POA: Diagnosis not present

## 2017-07-01 DIAGNOSIS — Z933 Colostomy status: Secondary | ICD-10-CM | POA: Diagnosis not present

## 2017-07-01 DIAGNOSIS — Z538 Procedure and treatment not carried out for other reasons: Secondary | ICD-10-CM | POA: Diagnosis not present

## 2017-07-01 DIAGNOSIS — N360 Urethral fistula: Secondary | ICD-10-CM | POA: Diagnosis not present

## 2017-07-01 DIAGNOSIS — I313 Pericardial effusion (noninflammatory): Secondary | ICD-10-CM | POA: Diagnosis not present

## 2017-07-01 DIAGNOSIS — N412 Abscess of prostate: Secondary | ICD-10-CM | POA: Diagnosis not present

## 2017-07-01 DIAGNOSIS — R339 Retention of urine, unspecified: Secondary | ICD-10-CM | POA: Diagnosis not present

## 2017-07-01 DIAGNOSIS — Z1810 Retained metal fragments, unspecified: Secondary | ICD-10-CM | POA: Diagnosis not present

## 2017-07-01 DIAGNOSIS — Z4682 Encounter for fitting and adjustment of non-vascular catheter: Secondary | ICD-10-CM | POA: Diagnosis not present

## 2017-07-01 DIAGNOSIS — R1907 Generalized intra-abdominal and pelvic swelling, mass and lump: Secondary | ICD-10-CM | POA: Diagnosis not present

## 2017-07-01 DIAGNOSIS — Z923 Personal history of irradiation: Secondary | ICD-10-CM | POA: Diagnosis not present

## 2017-07-01 DIAGNOSIS — L0291 Cutaneous abscess, unspecified: Secondary | ICD-10-CM | POA: Diagnosis not present

## 2017-07-11 DIAGNOSIS — Z933 Colostomy status: Secondary | ICD-10-CM | POA: Diagnosis not present

## 2017-07-11 DIAGNOSIS — N390 Urinary tract infection, site not specified: Secondary | ICD-10-CM | POA: Diagnosis not present

## 2017-07-11 DIAGNOSIS — Z435 Encounter for attention to cystostomy: Secondary | ICD-10-CM | POA: Diagnosis not present

## 2017-07-11 DIAGNOSIS — N360 Urethral fistula: Secondary | ICD-10-CM | POA: Diagnosis not present

## 2017-07-11 DIAGNOSIS — Z466 Encounter for fitting and adjustment of urinary device: Secondary | ICD-10-CM | POA: Diagnosis not present

## 2017-07-11 DIAGNOSIS — N321 Vesicointestinal fistula: Secondary | ICD-10-CM | POA: Diagnosis not present

## 2017-07-12 DIAGNOSIS — N360 Urethral fistula: Secondary | ICD-10-CM | POA: Diagnosis not present

## 2017-07-12 DIAGNOSIS — Z466 Encounter for fitting and adjustment of urinary device: Secondary | ICD-10-CM | POA: Diagnosis not present

## 2017-07-12 DIAGNOSIS — N321 Vesicointestinal fistula: Secondary | ICD-10-CM | POA: Diagnosis not present

## 2017-07-12 DIAGNOSIS — N390 Urinary tract infection, site not specified: Secondary | ICD-10-CM | POA: Diagnosis not present

## 2017-07-12 DIAGNOSIS — Z435 Encounter for attention to cystostomy: Secondary | ICD-10-CM | POA: Diagnosis not present

## 2017-07-12 DIAGNOSIS — Z933 Colostomy status: Secondary | ICD-10-CM | POA: Diagnosis not present

## 2017-07-14 ENCOUNTER — Other Ambulatory Visit: Payer: Self-pay

## 2017-07-14 NOTE — Patient Outreach (Signed)
[facility] [facility]) Care Management  07/14/2017  Ricardo Johnson March 25, 1946 [id number]   Referral Date: 07/14/17 Referral Source: [employer]  Date of Admission: 07/01/17 Diagnosis: Pelvic Abscess Date of Discharge: 07/10/17 Facility: [name] attempt # 1 Telephone call to patient for [facility] assessment.  Patient able to verify HIPAA.  Patient reports he is doing good and is back to himself.  He states that his weight is back up to 120 lbs. Patient drinking ensure at least twice a day.    Social: Patient reports that he lives alone and that his daughter comes over daily and that she just left.  He states that his daughter does his grocery shopping and taking him to appointments.    Conditions: Patient has prostate cancer and was admitted recently for pelvic abscess. Patient currently on antibiotics.  Patient reports he has a suprapubic catheter and a colostomy bag.  Patient states he is able to care for them himself.  He states that the nurse is coming from [place] and is due to come on tomorrow.  Patient also states that the therapist has been coming as well.    Medications: Patient able to review medications and has no questions at this time.    Appointments: Patient has an appointment with urology in March.  Asked patient about seeing his primary care physician. He states that he has an appointment in March.  Advised patient that he needed to call for a hospital follow up appointment tomorrow. Advised patient that I would call him tomorrow to make sure he did call to make an appointment. Patient agreeable for call back tomorrow.    Consent: RN CM reviewed [facility] services with patient. Patient declined services at this time.  Plan: RN CM will call for follow up on tomorrow about patient making appointment with PCP. RN CM will send letter and brochure for future reference.      Jone Baseman, RN, MSN [facility] Care Management Care Management  Coordinator Direct Line [phone] Toll Free: [phone]  Fax: [phone]

## 2017-07-15 ENCOUNTER — Other Ambulatory Visit: Payer: Self-pay

## 2017-07-15 DIAGNOSIS — N360 Urethral fistula: Secondary | ICD-10-CM | POA: Diagnosis not present

## 2017-07-15 DIAGNOSIS — N321 Vesicointestinal fistula: Secondary | ICD-10-CM | POA: Diagnosis not present

## 2017-07-15 DIAGNOSIS — Z933 Colostomy status: Secondary | ICD-10-CM | POA: Diagnosis not present

## 2017-07-15 DIAGNOSIS — N390 Urinary tract infection, site not specified: Secondary | ICD-10-CM | POA: Diagnosis not present

## 2017-07-15 DIAGNOSIS — Z466 Encounter for fitting and adjustment of urinary device: Secondary | ICD-10-CM | POA: Diagnosis not present

## 2017-07-15 DIAGNOSIS — Z435 Encounter for attention to cystostomy: Secondary | ICD-10-CM | POA: Diagnosis not present

## 2017-07-15 NOTE — Patient Outreach (Signed)
[place] [facility]) Care Management  07/15/2017  Kaid Seeberger [facility] 11-07-1945 [id number]   Telephone call to patient for follow up. No answer.  HIPAA compliant voice message left.    Plan: RN CM will attempt patient again within 3 business days.    Jone Baseman, RN, MSN [facility] Care Management Care Management Coordinator Direct Line [phone] Toll Free: [phone]  Fax: [phone]

## 2017-07-16 ENCOUNTER — Other Ambulatory Visit: Payer: Self-pay

## 2017-07-16 DIAGNOSIS — Z933 Colostomy status: Secondary | ICD-10-CM | POA: Diagnosis not present

## 2017-07-16 DIAGNOSIS — N390 Urinary tract infection, site not specified: Secondary | ICD-10-CM | POA: Diagnosis not present

## 2017-07-16 DIAGNOSIS — Z435 Encounter for attention to cystostomy: Secondary | ICD-10-CM | POA: Diagnosis not present

## 2017-07-16 DIAGNOSIS — N321 Vesicointestinal fistula: Secondary | ICD-10-CM | POA: Diagnosis not present

## 2017-07-16 DIAGNOSIS — N360 Urethral fistula: Secondary | ICD-10-CM | POA: Diagnosis not present

## 2017-07-16 DIAGNOSIS — Z466 Encounter for fitting and adjustment of urinary device: Secondary | ICD-10-CM | POA: Diagnosis not present

## 2017-07-16 NOTE — Patient Outreach (Signed)
[employer] [facility]) Care Management  07/16/2017  Humzah Harty [facility] [birth year] [id number]   Telephone call to patient to follow up with patient about making an appointment with his primary care physician.  Patient states he has not called but will call when his daughter comes by today.  Offered to call physician office with patient on the phone but he declined.  Offered to call daughter to assist in setting up appointment but patient declined that also.  Advised patient that I would call to follow up on tomorrow about making an appointment with his primary care physician.  He verbalized understanding.   Plan: RN CM will contact patient tomorrow concerning primary care appointment.    Jone Baseman, RN, MSN [facility] Care Management Care Management Coordinator Direct Line [phone] Toll Free: [phone]  Fax: [phone]

## 2017-07-17 ENCOUNTER — Other Ambulatory Visit: Payer: Self-pay

## 2017-07-17 DIAGNOSIS — I10 Essential (primary) hypertension: Secondary | ICD-10-CM | POA: Diagnosis not present

## 2017-07-17 DIAGNOSIS — N390 Urinary tract infection, site not specified: Secondary | ICD-10-CM | POA: Diagnosis not present

## 2017-07-17 DIAGNOSIS — N321 Vesicointestinal fistula: Secondary | ICD-10-CM | POA: Diagnosis not present

## 2017-07-17 DIAGNOSIS — E7211 Homocystinuria: Secondary | ICD-10-CM | POA: Diagnosis not present

## 2017-07-17 DIAGNOSIS — Z933 Colostomy status: Secondary | ICD-10-CM | POA: Diagnosis not present

## 2017-07-17 DIAGNOSIS — E785 Hyperlipidemia, unspecified: Secondary | ICD-10-CM | POA: Diagnosis not present

## 2017-07-17 DIAGNOSIS — H538 Other visual disturbances: Secondary | ICD-10-CM | POA: Diagnosis not present

## 2017-07-17 DIAGNOSIS — E559 Vitamin D deficiency, unspecified: Secondary | ICD-10-CM | POA: Diagnosis not present

## 2017-07-17 DIAGNOSIS — Z466 Encounter for fitting and adjustment of urinary device: Secondary | ICD-10-CM | POA: Diagnosis not present

## 2017-07-17 DIAGNOSIS — E1165 Type 2 diabetes mellitus with hyperglycemia: Secondary | ICD-10-CM | POA: Diagnosis not present

## 2017-07-17 DIAGNOSIS — Z435 Encounter for attention to cystostomy: Secondary | ICD-10-CM | POA: Diagnosis not present

## 2017-07-17 DIAGNOSIS — Z720 Tobacco use: Secondary | ICD-10-CM | POA: Diagnosis not present

## 2017-07-17 DIAGNOSIS — E039 Hypothyroidism, unspecified: Secondary | ICD-10-CM | POA: Diagnosis not present

## 2017-07-17 DIAGNOSIS — N360 Urethral fistula: Secondary | ICD-10-CM | POA: Diagnosis not present

## 2017-07-17 DIAGNOSIS — Z0000 Encounter for general adult medical examination without abnormal findings: Secondary | ICD-10-CM | POA: Diagnosis not present

## 2017-07-17 NOTE — Patient Outreach (Signed)
[facility]) Care Management  07/17/2017  Ricardo Johnson 1946/02/11 [id number]   Telephone call to patient.  He is able to verify HIPAA.  Patient reports that he is doing ok and that he had an appointment with his primary care doctor today.  He states that visit went well and that the doctor did all kinds of test and blood work today. Patient reports he is doing good with his catheter and colostomy. Patient offers no concern today and continues to decline services today.    Plan: RN CM will close case and notify care management assistant of case status.    Jone Baseman, RN, MSN [facility] Care Management Care Management Coordinator Direct Line [phone] Toll Free: [phone]  Fax: [phone]

## 2017-07-22 ENCOUNTER — Emergency Department (HOSPITAL_COMMUNITY)
Admission: EM | Admit: 2017-07-22 | Discharge: 2017-07-22 | Disposition: A | Discharge status: Home / Self Care | Payer: Medicare HMO | Attending: Emergency Medicine | Admitting: Emergency Medicine

## 2017-07-22 ENCOUNTER — Other Ambulatory Visit: Payer: Self-pay

## 2017-07-22 ENCOUNTER — Encounter (HOSPITAL_COMMUNITY): Payer: Self-pay | Admitting: Nurse Practitioner

## 2017-07-22 DIAGNOSIS — Z87891 Personal history of nicotine dependence: Secondary | ICD-10-CM | POA: Diagnosis not present

## 2017-07-22 DIAGNOSIS — Z79899 Other long term (current) drug therapy: Secondary | ICD-10-CM | POA: Insufficient documentation

## 2017-07-22 DIAGNOSIS — E119 Type 2 diabetes mellitus without complications: Secondary | ICD-10-CM | POA: Insufficient documentation

## 2017-07-22 DIAGNOSIS — I10 Essential (primary) hypertension: Secondary | ICD-10-CM | POA: Insufficient documentation

## 2017-07-22 DIAGNOSIS — R310 Gross hematuria: Secondary | ICD-10-CM

## 2017-07-22 DIAGNOSIS — Z8546 Personal history of malignant neoplasm of prostate: Secondary | ICD-10-CM | POA: Insufficient documentation

## 2017-07-22 DIAGNOSIS — R319 Hematuria, unspecified: Secondary | ICD-10-CM | POA: Diagnosis present

## 2017-07-22 DIAGNOSIS — T83098A Other mechanical complication of other indwelling urethral catheter, initial encounter: Secondary | ICD-10-CM | POA: Diagnosis not present

## 2017-07-22 LAB — BASIC METABOLIC PANEL
ANION GAP: 5 (ref 5–15)
BUN: 22 mg/dL — ABNORMAL HIGH (ref 6–20)
CALCIUM: 9.2 mg/dL (ref 8.9–10.3)
CO2: 28 mmol/L (ref 22–32)
CREATININE: 0.83 mg/dL (ref 0.61–1.24)
Chloride: 108 mmol/L (ref 101–111)
GFR calc Af Amer: >60 mL/min (ref >60)
GFR calc non Af Amer: >60 mL/min (ref >60)
GLUCOSE: 96 mg/dL (ref 65–99)
Potassium: 4 mmol/L (ref 3.5–5.1)
Sodium: 141 mmol/L (ref 135–145)

## 2017-07-22 LAB — CBC WITH DIFFERENTIAL/PLATELET
BASOS ABS: 0 10*3/uL (ref 0.0–0.1)
BASOS PCT: 0 %
EOS ABS: 0.3 10*3/uL (ref 0.0–0.7)
Eosinophils Relative: 4 %
HCT: 26.9 % — ABNORMAL LOW (ref 39.0–52.0)
HEMOGLOBIN: 8.7 g/dL — AB (ref 13.0–17.0)
LYMPHS ABS: 1.6 10*3/uL (ref 0.7–4.0)
Lymphocytes Relative: 22 %
MCH: 29.5 pg (ref 26.0–34.0)
MCHC: 32.3 g/dL (ref 30.0–36.0)
MCV: 91.2 fL (ref 78.0–100.0)
Monocytes Absolute: 0.5 10*3/uL (ref 0.1–1.0)
Monocytes Relative: 7 %
NEUTROS PCT: 67 %
Neutro Abs: 5 10*3/uL (ref 1.7–7.7)
Platelets: 331 10*3/uL (ref 150–400)
RBC: 2.95 MIL/uL — AB (ref 4.22–5.81)
RDW: 17.3 % — ABNORMAL HIGH (ref 11.5–15.5)
WBC: 7.4 10*3/uL (ref 4.0–10.5)

## 2017-07-22 LAB — URINALYSIS, ROUTINE W REFLEX MICROSCOPIC
BILIRUBIN URINE: NEGATIVE
Glucose, UA: NEGATIVE mg/dL
Ketones, ur: NEGATIVE mg/dL
Nitrite: NEGATIVE
PROTEIN: 100 mg/dL — AB
SPECIFIC GRAVITY, URINE: 1.02 (ref 1.005–1.030)
pH: 6 (ref 5.0–8.0)

## 2017-07-22 NOTE — ED Notes (Signed)
Pt discharged around 1215 by previous RN.

## 2017-07-22 NOTE — ED Triage Notes (Signed)
Patient was send by his MD due to having blood all of a sudden in his urine. Patient had the super-pubic cath since NOV with no issues. Patient had prostate cancer and now has a colostomy bag and a super-pub cath. Patient has the cancer seeds implanted.

## 2017-07-22 NOTE — ED Provider Notes (Signed)
[facility] DEPT Provider Note   CSN: [id number] Arrival date & time: 07/22/17  1540     History   Chief Complaint Chief Complaint  Patient presents with  . blood in super-pubic cath    HPI Ricardo Johnson is a 72 y.o. male.  HPI  72 year old male presents with hematuria. Has a suprapubic cath and complicated urologic history. Was discharged from [facility] last week for pelvic abscesses and is on levaquin.  Last night noticed dark red blood with clots in his catheter.  This morning it is lighter and seems to be flowing better but it is still somewhat red and pink.  There is no abdominal pain, fevers, or back pain.  No vomiting.  He states when he had the pelvic abscesses at [facility] he was having abdominal pain and distention.  His urologist is Dr. Alinda Money. Is not on any blood thinners.  Past Medical History:  Diagnosis Date  . Alcohol abuse   . BPH (benign prostatic hypertrophy) 2000-2001  . Diabetes mellitus without complication ([place])    pt reports that they were checking him during last admission for DM but never started any medicine or made any referrals for F/U  . History of kidney stones   . History of nephrolithiasis   . Hypertension   . Prostate cancer ([place]) 2005  . Tobacco abuse     Patient Active Problem List   Diagnosis Date Noted  . 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 Suprapubic catheter in place 03/31/2017  . Rectal pain 03/31/2017  . UTI (urinary tract infection) 08/07/2016  . Acute renal failure (ARF) ([place]) 08/07/2016  . Anemia 08/07/2016  . Hyperglycemia 08/07/2016  . Hypokalemia 08/07/2016  . Syncope 08/07/2016  . Pain in the chest   . AKI (acute kidney injury) ([place])   . Noncompliance   . Chest pain 01/25/2015  . Accelerated hypertension 01/25/2015  . ADENOCARCINOMA, PROSTATE 02/23/2010  . ALCOHOL ABUSE 02/23/2010  . TOBACCO ABUSE 02/23/2010  . Essential hypertension 02/23/2010  . SYNCOPE 02/23/2010  .  BENIGN PROSTATIC HYPERTROPHY, HX OF 02/23/2010    Past Surgical History:  Procedure Laterality Date  . APPENDECTOMY    . CYSTOSCOPY N/A 08/05/2016   Procedure: CYSTOSCOPY, URETHROSCOPY;  Surgeon: Raynelle Bring, MD;  Location: [facility]S;  Service: Urology;  Laterality: N/A;  . CYSTOSCOPY N/A 09/05/2016   Procedure: CYSTOSCOPY, URETHROSCOPY, BALLOON DILATION OF SUPRAPUBIC TRACK, PLACEMENT OF 14FR SUPRAPUIC CATHETER;  Surgeon: Raynelle Bring, MD;  Location: [facility];  Service: Urology;  Laterality: N/A;  . LITHOTRIPSY    . RADIOACTIVE SEED IMPLANT     for prostate cancer       Home Medications    Prior to Admission medications   Medication Sig Start Date End Date Taking? Authorizing Provider  amLODipine (NORVASC) 10 MG tablet Take 10 mg by mouth at bedtime.  08/25/15  Yes [provider]  Cholecalciferol (VITAMIN D) 2000 units CAPS Take 1 capsule by mouth daily.   Yes [provider]  finasteride (PROSCAR) 5 MG tablet TAKE 1 TABLET BY MOUTH EVERY DAY 01/04/11  Yes Kalia-Reynolds, Maitri S, DO  folic acid (FOLVITE) 1 MG tablet Take 1 tablet (1 mg total) by mouth daily. 08/11/16  Yes Hongalgi, Lenis Dickinson, MD  levofloxacin (LEVAQUIN) 500 MG tablet Take 500 mg by mouth daily.   Yes [provider]  levothyroxine (SYNTHROID, LEVOTHROID) 100 MCG tablet Take 100 mcg by mouth daily before breakfast. 07/16/16  Yes [provider]  lisinopril-hydrochlorothiazide (PRINZIDE,ZESTORETIC) 20-25 MG tablet Take 1 tablet by mouth daily. 04/22/16  Yes [provider]  metoprolol succinate (TOPROL-XL) 100 MG 24 hr tablet Take 100 mg by mouth daily. 08/25/15  Yes [provider]  Tamsulosin HCl (FLOMAX) 0.4 MG CAPS TAKE ONE CAPSULE BY MOUTH EVERY DAY 11/27/10  Yes Kalia-Reynolds, Maitri S, DO  Multiple Vitamin (MULTIVITAMIN WITH MINERALS) TABS tablet Take 1 tablet by mouth daily. Patient not taking: Reported on 07/14/2017 08/11/16   Modena Jansky, MD  thiamine 100 MG  tablet Take 1 tablet (100 mg total) by mouth daily. Patient not taking: Reported on 07/14/2017 08/11/16   Modena Jansky, MD    Family History Family History  Problem Relation Age of Onset  . Heart disease Mother   . Heart disease Father   . Cancer Cousin     Social History Social History   Tobacco Use  . Smoking status: Former Smoker    Last attempt to quit: 01/30/2015    Years since quitting: 2.4  . Smokeless tobacco: Never Used  Substance Use Topics  . Alcohol use: No    Alcohol/week: 1.2 oz    Types: 1 Cans of beer, 1 Shots of liquor per week    Comment: "not since i got sick "  . Drug use: No     Allergies   Patient has no known allergies.   Review of Systems Review of Systems  Constitutional: Negative for fever.  Gastrointestinal: Negative for abdominal pain, nausea and vomiting.  Genitourinary: Positive for hematuria.  Musculoskeletal: Negative for back pain.  All other systems reviewed and are negative.    Physical Exam Updated Vital Signs BP 136/82   Pulse 74   Temp 97.8 F (36.6 C) (Oral)   Resp 16   Ht 5\' 6"  (1.676 m)   Wt 61.2 kg (135 lb)   SpO2 100%   BMI 21.79 kg/m   Physical Exam  Constitutional: He is oriented to person, place, and time. He appears well-developed and well-nourished. No distress.  HENT:  Head: Normocephalic and atraumatic.  Right Ear: External ear normal.  Left Ear: External ear normal.  Nose: Nose normal.  Eyes: Right eye exhibits no discharge. Left eye exhibits no discharge.  Neck: Neck supple.  Cardiovascular: Normal rate, regular rhythm and normal heart sounds.  Pulmonary/Chest: Effort normal and breath sounds normal.  Abdominal: Soft. He exhibits no distension. There is no tenderness.  suprapubic catheter in place. Red urine in place with 1 small clot in bag. Otherwise no surrounding redness, drainage or tenderness  Genitourinary: Right testis shows no swelling and no tenderness. Left testis shows no swelling  and no tenderness. No penile erythema or penile tenderness.  Musculoskeletal: He exhibits no edema.  Neurological: He is alert and oriented to person, place, and time.  Skin: Skin is warm and dry. He is not diaphoretic.  Nursing note and vitals reviewed.    ED Treatments / Results  Labs (all labs ordered are listed, but only abnormal results are displayed) Labs Reviewed  BASIC METABOLIC PANEL - Abnormal; Notable for the following components:      Result Value   BUN 22 (*)    All other components within normal limits  URINALYSIS, ROUTINE W REFLEX MICROSCOPIC - Abnormal; Notable for the following components:   APPearance CLOUDY (*)    Hgb urine dipstick LARGE (*)    Protein, ur 100 (*)    Leukocytes, UA LARGE (*)    Bacteria, UA  MANY (*)    Squamous Epithelial / LPF 0-5 (*)    All other components within normal limits  CBC WITH DIFFERENTIAL/PLATELET - Abnormal; Notable for the following components:   RBC 2.95 (*)    Hemoglobin 8.7 (*)    HCT 26.9 (*)    RDW 17.3 (*)    All other components within normal limits  URINE CULTURE    EKG  EKG Interpretation None       Radiology No results found.  Procedures Procedures (including critical care time)  Medications Ordered in ED Medications - No data to display   Initial Impression / Assessment and Plan / ED Course  I have reviewed the triage vital signs and the nursing notes.  Pertinent labs & imaging results that were available during my care of the patient were reviewed by me and considered in my medical decision making (see chart for details).     Patient's hematuria resolved after the catheter was flushed by the nurse.  Unclear exact cause of the hematuria but given that has stopped I think it is reasonable to discharge the patient home.  His renal function is at baseline.  He has anemia with a hemoglobin of 8.7 but on review of records from [facility], his hemoglobin was 8.5 on 1/20.  Thus his anemia is stable.  He  is not on a blood thinner.  He does appear to have leukocytes in his urine although this is unclear if this is contaminant due to having a chronic catheter.  Given no abdominal pain, fevers, vomiting, I think it is reasonable to send for culture but continue the Levaquin.  Follow-up with his urologist.  I discussed with his daughter, Ricardo Johnson at the request of the patient and she will help arrange outpatient follow-up.  Discharge home with return precautions.  Final Clinical Impressions(s) / ED Diagnoses   Final diagnoses:  Gross hematuria    ED Discharge Orders    None       [name], MD 07/22/17 1248

## 2017-07-23 DIAGNOSIS — E1165 Type 2 diabetes mellitus with hyperglycemia: Secondary | ICD-10-CM | POA: Diagnosis not present

## 2017-07-23 DIAGNOSIS — Z933 Colostomy status: Secondary | ICD-10-CM | POA: Diagnosis not present

## 2017-07-23 DIAGNOSIS — N321 Vesicointestinal fistula: Secondary | ICD-10-CM | POA: Diagnosis not present

## 2017-07-23 DIAGNOSIS — N390 Urinary tract infection, site not specified: Secondary | ICD-10-CM | POA: Diagnosis not present

## 2017-07-23 DIAGNOSIS — N360 Urethral fistula: Secondary | ICD-10-CM | POA: Diagnosis not present

## 2017-07-23 DIAGNOSIS — Z435 Encounter for attention to cystostomy: Secondary | ICD-10-CM | POA: Diagnosis not present

## 2017-07-23 DIAGNOSIS — N400 Enlarged prostate without lower urinary tract symptoms: Secondary | ICD-10-CM | POA: Diagnosis not present

## 2017-07-23 DIAGNOSIS — Z466 Encounter for fitting and adjustment of urinary device: Secondary | ICD-10-CM | POA: Diagnosis not present

## 2017-07-23 LAB — URINE CULTURE

## 2017-07-25 ENCOUNTER — Emergency Department (HOSPITAL_COMMUNITY)
Admission: EM | Admit: 2017-07-25 | Discharge: 2017-07-26 | Disposition: A | Discharge status: Home / Self Care | Payer: Medicare HMO | Attending: Emergency Medicine | Admitting: Emergency Medicine

## 2017-07-25 ENCOUNTER — Encounter (HOSPITAL_COMMUNITY): Payer: Self-pay

## 2017-07-25 ENCOUNTER — Other Ambulatory Visit: Payer: Self-pay

## 2017-07-25 DIAGNOSIS — T83098A Other mechanical complication of other indwelling urethral catheter, initial encounter: Secondary | ICD-10-CM | POA: Diagnosis not present

## 2017-07-25 DIAGNOSIS — Y69 Unspecified misadventure during surgical and medical care: Secondary | ICD-10-CM | POA: Diagnosis not present

## 2017-07-25 DIAGNOSIS — Z933 Colostomy status: Secondary | ICD-10-CM | POA: Diagnosis not present

## 2017-07-25 DIAGNOSIS — E119 Type 2 diabetes mellitus without complications: Secondary | ICD-10-CM | POA: Insufficient documentation

## 2017-07-25 DIAGNOSIS — N321 Vesicointestinal fistula: Secondary | ICD-10-CM | POA: Diagnosis not present

## 2017-07-25 DIAGNOSIS — Z79899 Other long term (current) drug therapy: Secondary | ICD-10-CM | POA: Diagnosis not present

## 2017-07-25 DIAGNOSIS — R319 Hematuria, unspecified: Secondary | ICD-10-CM | POA: Diagnosis not present

## 2017-07-25 DIAGNOSIS — Z435 Encounter for attention to cystostomy: Secondary | ICD-10-CM | POA: Diagnosis not present

## 2017-07-25 DIAGNOSIS — N360 Urethral fistula: Secondary | ICD-10-CM | POA: Diagnosis not present

## 2017-07-25 DIAGNOSIS — Z87891 Personal history of nicotine dependence: Secondary | ICD-10-CM | POA: Diagnosis not present

## 2017-07-25 DIAGNOSIS — I10 Essential (primary) hypertension: Secondary | ICD-10-CM | POA: Diagnosis not present

## 2017-07-25 DIAGNOSIS — Z8546 Personal history of malignant neoplasm of prostate: Secondary | ICD-10-CM | POA: Insufficient documentation

## 2017-07-25 DIAGNOSIS — T83198A Other mechanical complication of other urinary devices and implants, initial encounter: Secondary | ICD-10-CM | POA: Diagnosis not present

## 2017-07-25 DIAGNOSIS — N390 Urinary tract infection, site not specified: Secondary | ICD-10-CM | POA: Diagnosis not present

## 2017-07-25 DIAGNOSIS — Z466 Encounter for fitting and adjustment of urinary device: Secondary | ICD-10-CM | POA: Diagnosis not present

## 2017-07-25 NOTE — ED Notes (Signed)
aspitated 10cc of urine from 3 way valve on suprapubic cath tubing, several blood clots seemed to be dislodged, pt began draining urins with small blood clots after this.

## 2017-07-25 NOTE — ED Notes (Signed)
Drained 100cc urine until there was another clog.  Aspirated more blood clots and then urine continued to flow for some time until blood clots obstructed it again.  Flushed with 40cc NS and then emptied the urine bag of 240cc of red urine (was amber with blood clots originally).

## 2017-07-25 NOTE — ED Triage Notes (Signed)
Pt c/o clogged suprapubic catheter. Catheter flushed in triage, but pt states that he wants to know why there is a blood tinge in it. A&Ox4. No other complaints.

## 2017-07-26 LAB — URINALYSIS, ROUTINE W REFLEX MICROSCOPIC

## 2017-07-26 LAB — URINALYSIS, MICROSCOPIC (REFLEX)

## 2017-07-26 NOTE — ED Notes (Signed)
Irrigate foley with 200cc NS per EDP order

## 2017-07-26 NOTE — ED Notes (Signed)
After draining the 200cc back out into his leg bag I instilled another 200cc NS through port in suprapubic cath.  Pt tolerated this well.

## 2017-07-26 NOTE — ED Notes (Signed)
RN contacted daughter. Daughter, Kojo Liby is in route to [facility] to pick pt up.

## 2017-07-26 NOTE — ED Notes (Signed)
Drained the previous 200cc by suprapubic cath and then instilled another 200cc.  Pt continued to have very small blood clots.

## 2017-07-26 NOTE — Discharge Instructions (Signed)
See your primary care doctor, and urologist, next week as scheduled.  Make sure that you are drinking plenty of fluids.  Return here, if needed, for problems.

## 2017-07-26 NOTE — ED Notes (Signed)
Instilled 1L NS into suprapubic cath (in 259ml increments) and emptied 1400cc.  Pt continues to have clots, will notify edp

## 2017-07-26 NOTE — ED Notes (Signed)
Infused a last 200cc NS after draining the last 200cc as there were much less but still a few clots when drained

## 2017-07-26 NOTE — ED Notes (Signed)
No further clots came from catheter, currently draining.

## 2017-07-26 NOTE — ED Notes (Signed)
Spoke with Dr. Tomi Bamberger about pt foley having clots again and not draining well.  She advised to have him drink water and see if it will drain

## 2017-07-26 NOTE — ED Provider Notes (Signed)
[facility] DEPT Provider Note   CSN: [id number] Arrival date & time: 07/25/17  4098     History   Chief Complaint Chief Complaint  Patient presents with  . Clogged Catheter    HPI Ricardo Johnson is a 72 y.o. male.  He presents for evaluation of intermittent hematuria.  Today blood in urine returned, then his catheter stopped draining urine.  Here 4 days ago with similar problem, urine subsequently cleared to yellow in the interim.  He denies fever, vomiting, anorexia, weakness or dizziness.  There are no other known modifying factors.  HPI  Past Medical History:  Diagnosis Date  . Alcohol abuse   . BPH (benign prostatic hypertrophy) 2000-2001  . Diabetes mellitus without complication ([place])    pt reports that they were checking him during last admission for DM but never started any medicine or made any referrals for F/U  . History of kidney stones   . History of nephrolithiasis   . Hypertension   . Prostate cancer ([place]) 2005  . Tobacco abuse     Patient Active Problem List   Diagnosis Date Noted  . 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 Suprapubic catheter in place 03/31/2017  . Rectal pain 03/31/2017  . UTI (urinary tract infection) 08/07/2016  . Acute renal failure (ARF) ([place]) 08/07/2016  . Anemia 08/07/2016  . Hyperglycemia 08/07/2016  . Hypokalemia 08/07/2016  . Syncope 08/07/2016  . Pain in the chest   . AKI (acute kidney injury) ([place])   . Noncompliance   . Chest pain 01/25/2015  . Accelerated hypertension 01/25/2015  . ADENOCARCINOMA, PROSTATE 02/23/2010  . ALCOHOL ABUSE 02/23/2010  . TOBACCO ABUSE 02/23/2010  . Essential hypertension 02/23/2010  . SYNCOPE 02/23/2010  . BENIGN PROSTATIC HYPERTROPHY, HX OF 02/23/2010    Past Surgical History:  Procedure Laterality Date  . APPENDECTOMY    . CYSTOSCOPY N/A 08/05/2016   Procedure: CYSTOSCOPY, URETHROSCOPY;  Surgeon: Raynelle Bring, MD;  Location: [facility];   Service: Urology;  Laterality: N/A;  . CYSTOSCOPY N/A 09/05/2016   Procedure: CYSTOSCOPY, URETHROSCOPY, BALLOON DILATION OF SUPRAPUBIC TRACK, PLACEMENT OF 14FR SUPRAPUIC CATHETER;  Surgeon: Raynelle Bring, MD;  Location: [facility];  Service: Urology;  Laterality: N/A;  . LITHOTRIPSY    . RADIOACTIVE SEED IMPLANT     for prostate cancer       Home Medications    Prior to Admission medications   Medication Sig Start Date End Date Taking? Authorizing Provider  amLODipine (NORVASC) 10 MG tablet Take 10 mg by mouth at bedtime.  08/25/15  Yes [provider]  Cholecalciferol (VITAMIN D) 2000 units CAPS Take 1 capsule by mouth daily.   Yes [provider]  finasteride (PROSCAR) 5 MG tablet TAKE 1 TABLET BY MOUTH EVERY DAY 01/04/11  Yes Kalia-Reynolds, Maitri S, DO  folic acid (FOLVITE) 1 MG tablet Take 1 tablet (1 mg total) by mouth daily. 08/11/16  Yes Hongalgi, Lenis Dickinson, MD  levofloxacin (LEVAQUIN) 500 MG tablet Take 500 mg by mouth daily.   Yes [provider]  levothyroxine (SYNTHROID, LEVOTHROID) 100 MCG tablet Take 100 mcg by mouth daily before breakfast. 07/16/16  Yes [provider]  lisinopril-hydrochlorothiazide (PRINZIDE,ZESTORETIC) 20-25 MG tablet Take 1 tablet by mouth daily. 04/22/16  Yes [provider]  metoprolol succinate (TOPROL-XL) 100 MG 24 hr tablet Take 100 mg by mouth daily. 08/25/15  Yes [provider]  naproxen sodium (ALEVE) 220 MG tablet Take 220 mg by mouth 2 (  two) times daily as needed (pain).   Yes [provider]  Tamsulosin HCl (FLOMAX) 0.4 MG CAPS TAKE ONE CAPSULE BY MOUTH EVERY DAY 11/27/10  Yes Kalia-Reynolds, Maitri S, DO  thiamine (VITAMIN B-1) 100 MG tablet Take 100 mg by mouth daily.   Yes [provider]  Multiple Vitamin (MULTIVITAMIN WITH MINERALS) TABS tablet Take 1 tablet by mouth daily. Patient not taking: Reported on 07/14/2017 08/11/16   Modena Jansky, MD  thiamine 100 MG tablet Take 1  tablet (100 mg total) by mouth daily. Patient not taking: Reported on 07/14/2017 08/11/16   Modena Jansky, MD    Family History Family History  Problem Relation Age of Onset  . Heart disease Mother   . Heart disease Father   . Cancer Cousin     Social History Social History   Tobacco Use  . Smoking status: Former Smoker    Last attempt to quit: 01/30/2015    Years since quitting: 2.4  . Smokeless tobacco: Never Used  Substance Use Topics  . Alcohol use: No    Alcohol/week: 1.2 oz    Types: 1 Cans of beer, 1 Shots of liquor per week    Comment: "not since i got sick "  . Drug use: No     Allergies   Patient has no known allergies.   Review of Systems Review of Systems  All other systems reviewed and are negative.    Physical Exam Updated Vital Signs BP (!) 161/88 (BP Location: Left Arm)   Pulse 73   Temp 97.7 F (36.5 C) (Oral)   Resp 16   SpO2 100%   Physical Exam  Constitutional: He is oriented to person, place, and time. He appears well-developed. No distress.  Elderly, appears under nourished.  HENT:  Head: Normocephalic and atraumatic.  Right Ear: External ear normal.  Left Ear: External ear normal.  Eyes: Conjunctivae and EOM are normal. Pupils are equal, round, and reactive to light.  Neck: Normal range of motion and phonation normal. Neck supple.  Cardiovascular: Normal rate, regular rhythm and normal heart sounds.  Pulmonary/Chest: Effort normal and breath sounds normal. He exhibits no bony tenderness.  Abdominal: Soft. There is no tenderness.  Suprapubic catheter present, site and appliance appears normal.  Red blood in catheter collection bag.  Musculoskeletal: Normal range of motion.  Neurological: He is alert and oriented to person, place, and time. No cranial nerve deficit or sensory deficit. He exhibits normal muscle tone. Coordination normal.  Skin: Skin is warm, dry and intact.  Psychiatric: He has a normal mood and affect. His behavior  is normal. Judgment and thought content normal.  Nursing note and vitals reviewed.    ED Treatments / Results  Labs (all labs ordered are listed, but only abnormal results are displayed) Labs Reviewed  URINALYSIS, ROUTINE W REFLEX MICROSCOPIC - Abnormal; Notable for the following components:      Result Value   Color, Urine RED (*)    APPearance TURBID (*)    Glucose, UA   (*)    Value: TEST NOT REPORTED DUE TO COLOR INTERFERENCE OF URINE PIGMENT   Hgb urine dipstick   (*)    Value: TEST NOT REPORTED DUE TO COLOR INTERFERENCE OF URINE PIGMENT   Bilirubin Urine   (*)    Value: TEST NOT REPORTED DUE TO COLOR INTERFERENCE OF URINE PIGMENT   Ketones, ur   (*)    Value: TEST NOT REPORTED DUE TO COLOR INTERFERENCE OF  URINE PIGMENT   Protein, ur   (*)    Value: TEST NOT REPORTED DUE TO COLOR INTERFERENCE OF URINE PIGMENT   Nitrite   (*)    Value: TEST NOT REPORTED DUE TO COLOR INTERFERENCE OF URINE PIGMENT   Leukocytes, UA   (*)    Value: TEST NOT REPORTED DUE TO COLOR INTERFERENCE OF URINE PIGMENT   All other components within normal limits  URINALYSIS, MICROSCOPIC (REFLEX) - Abnormal; Notable for the following components:   Bacteria, UA FEW (*)    Squamous Epithelial / LPF 0-5 (*)    All other components within normal limits    EKG  EKG Interpretation None       Radiology No results found.  Procedures Procedures (including critical care time)  Medications Ordered in ED Medications - No data to display   Initial Impression / Assessment and Plan / ED Course  I have reviewed the triage vital signs and the nursing notes.  Pertinent labs & imaging results that were available during my care of the patient were reviewed by me and considered in my medical decision making (see chart for details).  Clinical Course as of Jul 26 46  Sat Jul 26, 2017  0047 I discussed the situation with the patient's daughter, who states that he has an appointment with his urologist, on  08/01/17.   [EW]    Clinical Course User Index [EW] Daleen Bo, MD     Patient Vitals for the past 24 hrs:  BP Temp Temp src Pulse Resp SpO2  07/26/17 0025 (!) 161/88 97.7 F (36.5 C) Oral 73 16 100 %  07/26/17 0000 - - - 72 - 100 %  07/25/17 2214 (!) 160/94 - - 78 18 100 %  07/25/17 1754 (!) 148/83 (!) 97.4 F (36.3 C) Oral 93 18 100 %    12:48 AM Reevaluation with update and discussion. After initial assessment and treatment, an updated evaluation reveals he is comfortable.  Urine in collection bag is mildly pink color.  Findings discussed with patient and daughter, all questions answered. Daleen Bo      Final Clinical Impressions(s) / ED Diagnoses   Final diagnoses:  Hematuria, unspecified type   Recurrent hematuria, with suprapubic catheter malfunction.  Recent admission 07/01/17, with infected ostomy tract, and secondary pelvic abscess, which cleared with replacement of suprapubic catheter.  He continues on Levaquin at this time.  Unclear cause of bleeding, patient is hemodynamically stable.  Urine sent for testing is nondiagnostic, doubt urinary tract infection.  Nursing Notes Reviewed/ Care Coordinated Applicable Imaging Reviewed Interpretation of Laboratory Data incorporated into ED treatment  The patient appears reasonably screened and/or stabilized for discharge and I doubt any other medical condition or other [facility] requiring further screening, evaluation, or treatment in the ED at this time prior to discharge.  Plan: Home Medications-continue current medications; Home Treatments-rest, fluids; return here if the recommended treatment, does not improve the symptoms; Recommended follow up-urology follow-up 3-5 days.   ED Discharge Orders    None       Daleen Bo, MD 07/26/17 [phone]

## 2017-07-28 DIAGNOSIS — N360 Urethral fistula: Secondary | ICD-10-CM | POA: Diagnosis not present

## 2017-07-28 DIAGNOSIS — N321 Vesicointestinal fistula: Secondary | ICD-10-CM | POA: Diagnosis not present

## 2017-07-28 DIAGNOSIS — Z466 Encounter for fitting and adjustment of urinary device: Secondary | ICD-10-CM | POA: Diagnosis not present

## 2017-07-28 DIAGNOSIS — Z933 Colostomy status: Secondary | ICD-10-CM | POA: Diagnosis not present

## 2017-07-28 DIAGNOSIS — N390 Urinary tract infection, site not specified: Secondary | ICD-10-CM | POA: Diagnosis not present

## 2017-07-28 DIAGNOSIS — Z435 Encounter for attention to cystostomy: Secondary | ICD-10-CM | POA: Diagnosis not present

## 2017-07-31 DIAGNOSIS — Z87448 Personal history of other diseases of urinary system: Secondary | ICD-10-CM | POA: Diagnosis not present

## 2017-08-01 DIAGNOSIS — Z435 Encounter for attention to cystostomy: Secondary | ICD-10-CM | POA: Diagnosis not present

## 2017-08-01 DIAGNOSIS — N390 Urinary tract infection, site not specified: Secondary | ICD-10-CM | POA: Diagnosis not present

## 2017-08-01 DIAGNOSIS — Z933 Colostomy status: Secondary | ICD-10-CM | POA: Diagnosis not present

## 2017-08-01 DIAGNOSIS — Z466 Encounter for fitting and adjustment of urinary device: Secondary | ICD-10-CM | POA: Diagnosis not present

## 2017-08-01 DIAGNOSIS — N321 Vesicointestinal fistula: Secondary | ICD-10-CM | POA: Diagnosis not present

## 2017-08-01 DIAGNOSIS — N360 Urethral fistula: Secondary | ICD-10-CM | POA: Diagnosis not present

## 2017-08-04 DIAGNOSIS — Z466 Encounter for fitting and adjustment of urinary device: Secondary | ICD-10-CM | POA: Diagnosis not present

## 2017-08-04 DIAGNOSIS — N390 Urinary tract infection, site not specified: Secondary | ICD-10-CM | POA: Diagnosis not present

## 2017-08-04 DIAGNOSIS — N360 Urethral fistula: Secondary | ICD-10-CM | POA: Diagnosis not present

## 2017-08-04 DIAGNOSIS — N321 Vesicointestinal fistula: Secondary | ICD-10-CM | POA: Diagnosis not present

## 2017-08-04 DIAGNOSIS — Z933 Colostomy status: Secondary | ICD-10-CM | POA: Diagnosis not present

## 2017-08-04 DIAGNOSIS — Z435 Encounter for attention to cystostomy: Secondary | ICD-10-CM | POA: Diagnosis not present

## 2017-08-05 DIAGNOSIS — Z466 Encounter for fitting and adjustment of urinary device: Secondary | ICD-10-CM | POA: Diagnosis not present

## 2017-08-05 DIAGNOSIS — Z435 Encounter for attention to cystostomy: Secondary | ICD-10-CM | POA: Diagnosis not present

## 2017-08-05 DIAGNOSIS — N321 Vesicointestinal fistula: Secondary | ICD-10-CM | POA: Diagnosis not present

## 2017-08-05 DIAGNOSIS — N360 Urethral fistula: Secondary | ICD-10-CM | POA: Diagnosis not present

## 2017-08-05 DIAGNOSIS — N390 Urinary tract infection, site not specified: Secondary | ICD-10-CM | POA: Diagnosis not present

## 2017-08-05 DIAGNOSIS — Z933 Colostomy status: Secondary | ICD-10-CM | POA: Diagnosis not present

## 2017-08-07 DIAGNOSIS — E7211 Homocystinuria: Secondary | ICD-10-CM | POA: Diagnosis not present

## 2017-08-07 DIAGNOSIS — N183 Chronic kidney disease, stage 3 (moderate): Secondary | ICD-10-CM | POA: Diagnosis not present

## 2017-08-07 DIAGNOSIS — M109 Gout, unspecified: Secondary | ICD-10-CM | POA: Diagnosis not present

## 2017-08-07 DIAGNOSIS — E559 Vitamin D deficiency, unspecified: Secondary | ICD-10-CM | POA: Diagnosis not present

## 2017-08-07 DIAGNOSIS — Z720 Tobacco use: Secondary | ICD-10-CM | POA: Diagnosis not present

## 2017-08-07 DIAGNOSIS — I10 Essential (primary) hypertension: Secondary | ICD-10-CM | POA: Diagnosis not present

## 2017-08-07 DIAGNOSIS — E785 Hyperlipidemia, unspecified: Secondary | ICD-10-CM | POA: Diagnosis not present

## 2017-08-07 DIAGNOSIS — E1165 Type 2 diabetes mellitus with hyperglycemia: Secondary | ICD-10-CM | POA: Diagnosis not present

## 2017-08-07 DIAGNOSIS — E039 Hypothyroidism, unspecified: Secondary | ICD-10-CM | POA: Diagnosis not present

## 2017-08-12 DIAGNOSIS — N321 Vesicointestinal fistula: Secondary | ICD-10-CM | POA: Diagnosis not present

## 2017-08-12 DIAGNOSIS — Z435 Encounter for attention to cystostomy: Secondary | ICD-10-CM | POA: Diagnosis not present

## 2017-08-12 DIAGNOSIS — N390 Urinary tract infection, site not specified: Secondary | ICD-10-CM | POA: Diagnosis not present

## 2017-08-12 DIAGNOSIS — Z466 Encounter for fitting and adjustment of urinary device: Secondary | ICD-10-CM | POA: Diagnosis not present

## 2017-08-12 DIAGNOSIS — Z933 Colostomy status: Secondary | ICD-10-CM | POA: Diagnosis not present

## 2017-08-12 DIAGNOSIS — N360 Urethral fistula: Secondary | ICD-10-CM | POA: Diagnosis not present

## 2017-08-15 ENCOUNTER — Other Ambulatory Visit: Payer: Self-pay

## 2017-08-15 ENCOUNTER — Encounter (HOSPITAL_COMMUNITY): Payer: Self-pay

## 2017-08-15 ENCOUNTER — Emergency Department (HOSPITAL_COMMUNITY)
Admission: EM | Admit: 2017-08-15 | Discharge: 2017-08-15 | Disposition: A | Discharge status: Home / Self Care | Payer: Medicare HMO | Attending: Emergency Medicine | Admitting: Emergency Medicine

## 2017-08-15 DIAGNOSIS — Z960 Presence of urogenital implants: Secondary | ICD-10-CM | POA: Diagnosis not present

## 2017-08-15 DIAGNOSIS — T83090A Other mechanical complication of cystostomy catheter, initial encounter: Secondary | ICD-10-CM

## 2017-08-15 DIAGNOSIS — Z79899 Other long term (current) drug therapy: Secondary | ICD-10-CM | POA: Diagnosis not present

## 2017-08-15 DIAGNOSIS — Z933 Colostomy status: Secondary | ICD-10-CM | POA: Diagnosis not present

## 2017-08-15 DIAGNOSIS — Y738 Miscellaneous gastroenterology and urology devices associated with adverse incidents, not elsewhere classified: Secondary | ICD-10-CM | POA: Insufficient documentation

## 2017-08-15 DIAGNOSIS — R3912 Poor urinary stream: Secondary | ICD-10-CM | POA: Diagnosis not present

## 2017-08-15 DIAGNOSIS — R1030 Lower abdominal pain, unspecified: Secondary | ICD-10-CM | POA: Diagnosis not present

## 2017-08-15 DIAGNOSIS — E119 Type 2 diabetes mellitus without complications: Secondary | ICD-10-CM | POA: Insufficient documentation

## 2017-08-15 DIAGNOSIS — T83198A Other mechanical complication of other urinary devices and implants, initial encounter: Secondary | ICD-10-CM | POA: Diagnosis not present

## 2017-08-15 DIAGNOSIS — Z87891 Personal history of nicotine dependence: Secondary | ICD-10-CM | POA: Diagnosis not present

## 2017-08-15 DIAGNOSIS — I10 Essential (primary) hypertension: Secondary | ICD-10-CM | POA: Insufficient documentation

## 2017-08-15 DIAGNOSIS — Z8546 Personal history of malignant neoplasm of prostate: Secondary | ICD-10-CM | POA: Diagnosis not present

## 2017-08-15 LAB — I-STAT CHEM 8, ED
BUN: 19 mg/dL (ref 6–20)
CALCIUM ION: 1.37 mmol/L (ref 1.15–1.40)
Chloride: 107 mmol/L (ref 101–111)
Creatinine, Ser: 0.9 mg/dL (ref 0.61–1.24)
GLUCOSE: 108 mg/dL — AB (ref 65–99)
HCT: 35 % — ABNORMAL LOW (ref 39.0–52.0)
Hemoglobin: 11.9 g/dL — ABNORMAL LOW (ref 13.0–17.0)
Potassium: 3.5 mmol/L (ref 3.5–5.1)
SODIUM: 145 mmol/L (ref 135–145)
TCO2: 27 mmol/L (ref 22–32)

## 2017-08-15 LAB — URINALYSIS, ROUTINE W REFLEX MICROSCOPIC
BILIRUBIN URINE: NEGATIVE
Glucose, UA: NEGATIVE mg/dL
Ketones, ur: NEGATIVE mg/dL
Nitrite: POSITIVE — AB
PROTEIN: 100 mg/dL — AB
SQUAMOUS EPITHELIAL / LPF: NONE SEEN
Specific Gravity, Urine: 1.023 (ref 1.005–1.030)
pH: 5 (ref 5.0–8.0)

## 2017-08-15 NOTE — Discharge Instructions (Signed)
Follow-up with your urologist.  Return for more difficulty with drainage.

## 2017-08-15 NOTE — ED Triage Notes (Signed)
Patient states is urostomy bag is not draining at all. Patient states it was draining good at 0200 today.

## 2017-08-15 NOTE — ED Provider Notes (Signed)
[facility] DEPT Provider Note   CSN: [id number] Arrival date & time: 08/15/17  6160     History   Chief Complaint Chief Complaint  Patient presents with  . urostomy issues    HPI Ricardo Johnson is a 72 y.o. male.  HPI Patient presents with decreased output of his suprapubic catheter.  States last was draining well at around 2 in the morning.  Slight suprapubic pain.  History of prostate cancer.  Denies fevers.  He sees Dr. Alinda Money in [place] and another urologist in [place]. Past Medical History:  Diagnosis Date  . Alcohol abuse   . BPH (benign prostatic hypertrophy) 2000-2001  . Diabetes mellitus without complication ([place])    pt reports that they were checking him during last admission for DM but never started any medicine or made any referrals for F/U  . History of kidney stones   . History of nephrolithiasis   . Hypertension   . Prostate cancer ([place]) 2005  . Tobacco abuse     Patient Active Problem List   Diagnosis Date Noted  . 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 Suprapubic catheter in place 03/31/2017  . Rectal pain 03/31/2017  . UTI (urinary tract infection) 08/07/2016  . Acute renal failure (ARF) ([place]) 08/07/2016  . Anemia 08/07/2016  . Hyperglycemia 08/07/2016  . Hypokalemia 08/07/2016  . Syncope 08/07/2016  . Pain in the chest   . AKI (acute kidney injury) ([place])   . Noncompliance   . Chest pain 01/25/2015  . Accelerated hypertension 01/25/2015  . ADENOCARCINOMA, PROSTATE 02/23/2010  . ALCOHOL ABUSE 02/23/2010  . TOBACCO ABUSE 02/23/2010  . Essential hypertension 02/23/2010  . SYNCOPE 02/23/2010  . BENIGN PROSTATIC HYPERTROPHY, HX OF 02/23/2010    Past Surgical History:  Procedure Laterality Date  . APPENDECTOMY    . CYSTOSCOPY N/A 08/05/2016   Procedure: CYSTOSCOPY, URETHROSCOPY;  Surgeon: Raynelle Bring, MD;  Location: [facility];  Service: Urology;  Laterality: N/A;  . CYSTOSCOPY N/A 09/05/2016    Procedure: CYSTOSCOPY, URETHROSCOPY, BALLOON DILATION OF SUPRAPUBIC TRACK, PLACEMENT OF 14FR SUPRAPUIC CATHETER;  Surgeon: Raynelle Bring, MD;  Location: [facility];  Service: Urology;  Laterality: N/A;  . LITHOTRIPSY    . RADIOACTIVE SEED IMPLANT     for prostate cancer       Home Medications    Prior to Admission medications   Medication Sig Start Date End Date Taking? Authorizing Provider  amLODipine (NORVASC) 10 MG tablet Take 10 mg by mouth daily.  08/25/15  Yes [provider]  Cholecalciferol (VITAMIN D) 2000 units CAPS Take 1 capsule by mouth daily.   Yes [provider]  finasteride (PROSCAR) 5 MG tablet TAKE 1 TABLET BY MOUTH EVERY DAY 01/04/11  Yes Kalia-Reynolds, Maitri S, DO  folic acid (FOLVITE) 1 MG tablet Take 1 tablet (1 mg total) by mouth daily. 08/11/16  Yes Hongalgi, Lenis Dickinson, MD  levothyroxine (SYNTHROID, LEVOTHROID) 100 MCG tablet Take 100 mcg by mouth daily before breakfast. 07/16/16  Yes [provider]  lisinopril-hydrochlorothiazide (PRINZIDE,ZESTORETIC) 20-25 MG tablet Take 1 tablet by mouth daily. 04/22/16  Yes [provider]  metoprolol succinate (TOPROL-XL) 100 MG 24 hr tablet Take 100 mg by mouth daily. 08/25/15  Yes [provider]  Multiple Vitamin (MULTIVITAMIN WITH MINERALS) TABS tablet Take 1 tablet by mouth daily. 08/11/16  Yes Hongalgi, Lenis Dickinson, MD  naproxen sodium (ALEVE) 220 MG tablet Take 220 mg by mouth 2 (two) times daily as needed (pain).  Yes [provider]  Tamsulosin HCl (FLOMAX) 0.4 MG CAPS TAKE ONE CAPSULE BY MOUTH EVERY DAY 11/27/10  Yes Kalia-Reynolds, Maitri S, DO  thiamine 100 MG tablet Take 1 tablet (100 mg total) by mouth daily. 08/11/16  Yes Hongalgi, Lenis Dickinson, MD    Family History Family History  Problem Relation Age of Onset  . Heart disease Mother   . Heart disease Father   . Cancer Cousin     Social History Social History   Tobacco Use  . Smoking status: Former Smoker    Last  attempt to quit: 01/30/2015    Years since quitting: 2.5  . Smokeless tobacco: Never Used  Substance Use Topics  . Alcohol use: No    Alcohol/week: 1.2 oz    Types: 1 Cans of beer, 1 Shots of liquor per week    Comment: "not since i got sick "  . Drug use: No     Allergies   Patient has no known allergies.   Review of Systems Review of Systems  Constitutional: Negative for diaphoresis.  Respiratory: Negative for shortness of breath.   Gastrointestinal: Negative for abdominal pain.  Genitourinary: Positive for decreased urine volume. Negative for testicular pain.  Musculoskeletal: Negative for back pain.  Neurological: Negative for weakness.  Psychiatric/Behavioral: Negative for confusion.     Physical Exam Updated Vital Signs BP (!) 150/78   Pulse 92   Temp 97.8 F (36.6 C) (Oral)   Resp 16   Ht 5\' 6"  (1.676 m)   Wt 61.2 kg (135 lb)   SpO2 100%   BMI 21.79 kg/m   Physical Exam  Constitutional: He appears well-developed.  HENT:  Head: Atraumatic.  Eyes: Pupils are equal, round, and reactive to light.  Neck: Normal range of motion.  Cardiovascular: Normal rate.  Pulmonary/Chest: No respiratory distress.  Abdominal:  Mild suprapubic tenderness.  Blue suprapubic catheter sutured in place.  Also left lower quadrant colostomy.  No mass palpated.  Musculoskeletal: He exhibits no edema.  Neurological: He is alert.  Skin: Skin is warm. Capillary refill takes less than 2 seconds.     ED Treatments / Results  Labs (all labs ordered are listed, but only abnormal results are displayed) Labs Reviewed  URINALYSIS, ROUTINE W REFLEX MICROSCOPIC - Abnormal; Notable for the following components:      Result Value   APPearance TURBID (*)    Hgb urine dipstick MODERATE (*)    Protein, ur 100 (*)    Nitrite POSITIVE (*)    Leukocytes, UA MODERATE (*)    Bacteria, UA RARE (*)    All other components within normal limits  I-STAT CHEM 8, ED - Abnormal; Notable for the  following components:   Glucose, Bld 108 (*)    Hemoglobin 11.9 (*)    HCT 35.0 (*)    All other components within normal limits    EKG  EKG Interpretation None       Radiology No results found.  Procedures Procedures (including critical care time)  Medications Ordered in ED Medications - No data to display   Initial Impression / Assessment and Plan / ED Course  I have reviewed the triage vital signs and the nursing notes.  Pertinent labs & imaging results that were available during my care of the patient were reviewed by me and considered in my medical decision making (see chart for details).     Patient with decreased flow out of his suprapubic catheter.  It is a catheter  sutured in place and not a Foley catheter.  Flushed easily on both sides of the port both into the bladder and out the bag.  Flowing freely.  No fevers.  Will have follow-up with urology for further management.  Final Clinical Impressions(s) / ED Diagnoses   Final diagnoses:  Complication, blocked suprapubic catheter, initial encounter [facility])    ED Discharge Orders    None       Davonna Belling, MD 08/15/17 1341

## 2017-08-20 DIAGNOSIS — N183 Chronic kidney disease, stage 3 (moderate): Secondary | ICD-10-CM | POA: Diagnosis not present

## 2017-08-20 DIAGNOSIS — I10 Essential (primary) hypertension: Secondary | ICD-10-CM | POA: Diagnosis not present

## 2017-08-27 DIAGNOSIS — R338 Other retention of urine: Secondary | ICD-10-CM | POA: Diagnosis not present

## 2017-08-27 DIAGNOSIS — N320 Bladder-neck obstruction: Secondary | ICD-10-CM | POA: Diagnosis not present

## 2017-09-03 ENCOUNTER — Other Ambulatory Visit: Payer: Self-pay | Admitting: *Deleted

## 2017-09-03 NOTE — Patient Outreach (Signed)
[facility]) Care Management  09/03/2017  Olanda Downie Saltzman September 29, 1945 [id number]   Referral Date:  [id number] Referral Source:  ED Census report Referral Reason:  High ED utilizing Insurance:   [employer] telephone call to patient.  [employer] compliance verified. RN discussed the services available and patient stated he does not need anything. He does not need any help with his catheter or colostomy supplies. Patient is declining services at this time no intervention needed.  Plan: Send unsuccessful outreach letter Inform CMA of closure  [facility] Management [phone]

## 2017-09-04 DIAGNOSIS — E559 Vitamin D deficiency, unspecified: Secondary | ICD-10-CM | POA: Diagnosis not present

## 2017-09-04 DIAGNOSIS — E039 Hypothyroidism, unspecified: Secondary | ICD-10-CM | POA: Diagnosis not present

## 2017-09-04 DIAGNOSIS — M109 Gout, unspecified: Secondary | ICD-10-CM | POA: Diagnosis not present

## 2017-09-04 DIAGNOSIS — I10 Essential (primary) hypertension: Secondary | ICD-10-CM | POA: Diagnosis not present

## 2017-09-04 DIAGNOSIS — N183 Chronic kidney disease, stage 3 (moderate): Secondary | ICD-10-CM | POA: Diagnosis not present

## 2017-09-04 DIAGNOSIS — E1165 Type 2 diabetes mellitus with hyperglycemia: Secondary | ICD-10-CM | POA: Diagnosis not present

## 2017-09-04 DIAGNOSIS — Z720 Tobacco use: Secondary | ICD-10-CM | POA: Diagnosis not present

## 2017-09-04 DIAGNOSIS — E7211 Homocystinuria: Secondary | ICD-10-CM | POA: Diagnosis not present

## 2017-09-04 DIAGNOSIS — E785 Hyperlipidemia, unspecified: Secondary | ICD-10-CM | POA: Diagnosis not present

## 2017-09-07 ENCOUNTER — Emergency Department (HOSPITAL_COMMUNITY)
Admission: EM | Admit: 2017-09-07 | Discharge: 2017-09-07 | Disposition: A | Discharge status: Home / Self Care | Payer: Medicare HMO | Attending: Emergency Medicine | Admitting: Emergency Medicine

## 2017-09-07 ENCOUNTER — Other Ambulatory Visit: Payer: Self-pay

## 2017-09-07 ENCOUNTER — Emergency Department (HOSPITAL_COMMUNITY): Payer: Medicare HMO

## 2017-09-07 ENCOUNTER — Encounter (HOSPITAL_COMMUNITY): Payer: Self-pay | Admitting: *Deleted

## 2017-09-07 DIAGNOSIS — Z79899 Other long term (current) drug therapy: Secondary | ICD-10-CM | POA: Insufficient documentation

## 2017-09-07 DIAGNOSIS — R1031 Right lower quadrant pain: Secondary | ICD-10-CM | POA: Diagnosis not present

## 2017-09-07 DIAGNOSIS — R399 Unspecified symptoms and signs involving the genitourinary system: Secondary | ICD-10-CM | POA: Insufficient documentation

## 2017-09-07 DIAGNOSIS — I10 Essential (primary) hypertension: Secondary | ICD-10-CM | POA: Insufficient documentation

## 2017-09-07 DIAGNOSIS — R1030 Lower abdominal pain, unspecified: Secondary | ICD-10-CM | POA: Diagnosis not present

## 2017-09-07 DIAGNOSIS — T83038A Leakage of other indwelling urethral catheter, initial encounter: Secondary | ICD-10-CM | POA: Diagnosis not present

## 2017-09-07 DIAGNOSIS — Z978 Presence of other specified devices: Secondary | ICD-10-CM | POA: Insufficient documentation

## 2017-09-07 DIAGNOSIS — Z8546 Personal history of malignant neoplasm of prostate: Secondary | ICD-10-CM | POA: Insufficient documentation

## 2017-09-07 DIAGNOSIS — Z87891 Personal history of nicotine dependence: Secondary | ICD-10-CM | POA: Insufficient documentation

## 2017-09-07 DIAGNOSIS — N4889 Other specified disorders of penis: Secondary | ICD-10-CM | POA: Diagnosis present

## 2017-09-07 DIAGNOSIS — R1013 Epigastric pain: Secondary | ICD-10-CM | POA: Diagnosis not present

## 2017-09-07 DIAGNOSIS — C787 Secondary malignant neoplasm of liver and intrahepatic bile duct: Secondary | ICD-10-CM | POA: Diagnosis not present

## 2017-09-07 LAB — CBC WITH DIFFERENTIAL/PLATELET
Basophils Absolute: 0 10*3/uL (ref 0.0–0.1)
Basophils Relative: 0 %
EOS ABS: 0.4 10*3/uL (ref 0.0–0.7)
EOS PCT: 4 %
HCT: 37.3 % — ABNORMAL LOW (ref 39.0–52.0)
Hemoglobin: 11.8 g/dL — ABNORMAL LOW (ref 13.0–17.0)
LYMPHS ABS: 1.5 10*3/uL (ref 0.7–4.0)
Lymphocytes Relative: 18 %
MCH: 29.6 pg (ref 26.0–34.0)
MCHC: 31.6 g/dL (ref 30.0–36.0)
MCV: 93.7 fL (ref 78.0–100.0)
MONO ABS: 0.4 10*3/uL (ref 0.1–1.0)
Monocytes Relative: 5 %
Neutro Abs: 6.1 10*3/uL (ref 1.7–7.7)
Neutrophils Relative %: 73 %
PLATELETS: 347 10*3/uL (ref 150–400)
RBC: 3.98 MIL/uL — AB (ref 4.22–5.81)
RDW: 13.5 % (ref 11.5–15.5)
WBC: 8.4 10*3/uL (ref 4.0–10.5)

## 2017-09-07 LAB — COMPREHENSIVE METABOLIC PANEL
ALT: 9 U/L — AB (ref 17–63)
AST: 10 U/L — AB (ref 15–41)
Albumin: 3.4 g/dL — ABNORMAL LOW (ref 3.5–5.0)
Alkaline Phosphatase: 68 U/L (ref 38–126)
Anion gap: 9 (ref 5–15)
BUN: 21 mg/dL — AB (ref 6–20)
CHLORIDE: 108 mmol/L (ref 101–111)
CO2: 22 mmol/L (ref 22–32)
CREATININE: 1.22 mg/dL (ref 0.61–1.24)
Calcium: 11.8 mg/dL — ABNORMAL HIGH (ref 8.9–10.3)
GFR calc Af Amer: >60 mL/min (ref >60)
GFR, EST NON AFRICAN AMERICAN: 57 mL/min — AB (ref >60)
GLUCOSE: 107 mg/dL — AB (ref 65–99)
Potassium: 4.2 mmol/L (ref 3.5–5.1)
Sodium: 139 mmol/L (ref 135–145)
Total Bilirubin: 0.2 mg/dL — ABNORMAL LOW (ref 0.3–1.2)
Total Protein: 7.9 g/dL (ref 6.5–8.1)

## 2017-09-07 LAB — I-STAT CG4 LACTIC ACID, ED: LACTIC ACID, VENOUS: 0.94 mmol/L (ref 0.5–1.9)

## 2017-09-07 MED ORDER — IOPAMIDOL (ISOVUE-300) INJECTION 61%
INTRAVENOUS | Status: AC
  Administered 2017-09-07: 100 mL
  Filled 2017-09-07: qty 100

## 2017-09-07 MED ORDER — IOPAMIDOL (ISOVUE-300) INJECTION 61%
INTRAVENOUS | Status: AC
  Administered 2017-09-07: 30 mL
  Filled 2017-09-07: qty 30

## 2017-09-07 MED ORDER — SODIUM CHLORIDE 0.9 % IV BOLUS (SEPSIS): 250.0000 mL | Freq: Once | INTRAVENOUS | Status: DC

## 2017-09-07 NOTE — ED Provider Notes (Signed)
[facility] DEPT Provider Note   CSN: [id number] Arrival date & time: 09/07/17  1007     History   Chief Complaint Chief Complaint  Patient presents with  . clogged foley    HPI Ricardo Johnson is a 72 y.o. male.  The history is provided by the patient. No language interpreter was used.    Ricardo Johnson is a 72 y.o. male who presents to the Emergency Department complaining of catheter problem.  Level V caveat due to poor historian.  He presents for decreased drainage from his suprapubic catheter since 5 pm last night.  He has pain throughout his penis and perineum for the last week, slight abdominal discomfort.  He noted blood in the catheter last night.  He has drainage around the catheter site for the last week.  He denies fevers, vomiting.    Past Medical History:  Diagnosis Date  . Alcohol abuse   . BPH (benign prostatic hypertrophy) 2000-2001  . Diabetes mellitus without complication ([place])    pt reports that they were checking him during last admission for DM but never started any medicine or made any referrals for F/U  . History of kidney stones   . History of nephrolithiasis   . Hypertension   . Prostate cancer ([place]) 2005  . Tobacco abuse     Patient Active Problem List   Diagnosis Date Noted  . 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 Suprapubic catheter in place 03/31/2017  . Rectal pain 03/31/2017  . UTI (urinary tract infection) 08/07/2016  . Acute renal failure (ARF) ([place]) 08/07/2016  . Anemia 08/07/2016  . Hyperglycemia 08/07/2016  . Hypokalemia 08/07/2016  . Syncope 08/07/2016  . Pain in the chest   . AKI (acute kidney injury) ([place])   . Noncompliance   . Chest pain 01/25/2015  . Accelerated hypertension 01/25/2015  . ADENOCARCINOMA, PROSTATE 02/23/2010  . ALCOHOL ABUSE 02/23/2010  . TOBACCO ABUSE 02/23/2010  . Essential hypertension 02/23/2010  . SYNCOPE 02/23/2010  . BENIGN PROSTATIC HYPERTROPHY, HX  OF 02/23/2010    Past Surgical History:  Procedure Laterality Date  . APPENDECTOMY    . CYSTOSCOPY N/A 08/05/2016   Procedure: CYSTOSCOPY, URETHROSCOPY;  Surgeon: Raynelle Bring, MD;  Location: [facility];  Service: Urology;  Laterality: N/A;  . CYSTOSCOPY N/A 09/05/2016   Procedure: CYSTOSCOPY, URETHROSCOPY, BALLOON DILATION OF SUPRAPUBIC TRACK, PLACEMENT OF 14FR SUPRAPUIC CATHETER;  Surgeon: Raynelle Bring, MD;  Location: [facility];  Service: Urology;  Laterality: N/A;  . LITHOTRIPSY    . RADIOACTIVE SEED IMPLANT     for prostate cancer        Home Medications    Prior to Admission medications   Medication Sig Start Date End Date Taking? Authorizing Provider  amLODipine (NORVASC) 10 MG tablet Take 10 mg by mouth daily.  08/25/15  Yes [provider]  Cholecalciferol (VITAMIN D) 2000 units CAPS Take 2,000 Units by mouth daily.    Yes [provider]  finasteride (PROSCAR) 5 MG tablet TAKE 1 TABLET BY MOUTH EVERY DAY Patient taking differently: TAKE 1 TABLET (5mg ) BY MOUTH EVERY DAY 01/04/11  Yes Kalia-Reynolds, Maitri S, DO  folic acid (FOLVITE) 1 MG tablet Take 1 tablet (1 mg total) by mouth daily. 08/11/16  Yes Hongalgi, Lenis Dickinson, MD  levothyroxine (SYNTHROID, LEVOTHROID) 100 MCG tablet Take 100 mcg by mouth daily before breakfast. 07/16/16  Yes [provider]  lisinopril-hydrochlorothiazide (PRINZIDE,ZESTORETIC) 20-25 MG tablet Take 1 tablet by mouth daily.  04/22/16  Yes [provider]  metoprolol succinate (TOPROL-XL) 100 MG 24 hr tablet Take 100 mg by mouth daily. 08/25/15  Yes [provider]  Multiple Vitamin (MULTIVITAMIN WITH MINERALS) TABS tablet Take 1 tablet by mouth daily. 08/11/16  Yes Hongalgi, Lenis Dickinson, MD  Tamsulosin HCl (FLOMAX) 0.4 MG CAPS TAKE ONE CAPSULE BY MOUTH EVERY DAY Patient taking differently: TAKE ONE CAPSULE (0.4mg ) BY MOUTH EVERY DAY 11/27/10  Yes Kalia-Reynolds, Maitri S, DO  thiamine 100 MG tablet Take 1 tablet (100 mg  total) by mouth daily. 08/11/16  Yes Hongalgi, Lenis Dickinson, MD  naproxen sodium (ALEVE) 220 MG tablet Take 220 mg by mouth 2 (two) times daily as needed (pain).    [provider]    Family History Family History  Problem Relation Age of Onset  . Heart disease Mother   . Heart disease Father   . Cancer Cousin     Social History Social History   Tobacco Use  . Smoking status: Former Smoker    Last attempt to quit: 01/30/2015    Years since quitting: 2.6  . Smokeless tobacco: Never Used  Substance Use Topics  . Alcohol use: No    Alcohol/week: 1.2 oz    Types: 1 Cans of beer, 1 Shots of liquor per week    Comment: "not since i got sick "  . Drug use: No     Allergies   Patient has no known allergies.   Review of Systems Review of Systems  All other systems reviewed and are negative.    Physical Exam Updated Vital Signs BP 128/74 (BP Location: Left Arm)   Pulse 77   Temp 98.7 F (37.1 C) (Oral)   Resp 16   SpO2 100%   Physical Exam  Constitutional: He appears well-developed.  thin  HENT:  Head: Normocephalic and atraumatic.  Cardiovascular: Regular rhythm.  No murmur heard. tachcyardic  Pulmonary/Chest: Effort normal. No respiratory distress.  Abdominal: Soft. There is no rebound and no guarding.  Minimal  diffuse abdominal tenderness.  Stoma in left upper quadrant with yellow liquid in pouch, small amount of white sediment.  Suprapubic region with small blue catheter sutured in place, three way stopcock attached to drainage tube, dark material in tube.  Surrounding catheter insertion site there is a minimal amount of fibrinous exudate, no significant erythema/edema.    Musculoskeletal: He exhibits no edema or tenderness.  Neurological: He is alert.  Oriented to "emergency department" disoriented to time.  When asked the year he states January.    Skin: Skin is warm and dry.  Psychiatric: He has a normal mood and affect. His behavior is normal.  Nursing  note and vitals reviewed.    ED Treatments / Results  Labs (all labs ordered are listed, but only abnormal results are displayed) Labs Reviewed  COMPREHENSIVE METABOLIC PANEL - Abnormal; Notable for the following components:      Result Value   Glucose, Bld 107 (*)    BUN 21 (*)    Calcium 11.8 (*)    Albumin 3.4 (*)    AST 10 (*)    ALT 9 (*)    Total Bilirubin 0.2 (*)    GFR calc non Af Amer 57 (*)    All other components within normal limits  CBC WITH DIFFERENTIAL/PLATELET - Abnormal; Notable for the following components:   RBC 3.98 (*)    Hemoglobin 11.8 (*)    HCT 37.3 (*)    All other components  within normal limits  I-STAT CG4 LACTIC ACID, ED    EKG None  Radiology Ct Abdomen Pelvis W Contrast  Result Date: 09/07/2017 CLINICAL DATA:  Suprapubic catheter with limited output since yesterday. Prostate cancer with rectourethral fistula. EXAM: CT ABDOMEN AND PELVIS WITH CONTRAST TECHNIQUE: Multidetector CT imaging of the abdomen and pelvis was performed using the standard protocol following bolus administration of intravenous contrast. CONTRAST:  175mL ISOVUE-300 IOPAMIDOL (ISOVUE-300) INJECTION 61% COMPARISON:  Abdominopelvic CT 04/11/2017. FINDINGS: Lower chest: Mild emphysematous changes at both lung bases. Stable small pericardial effusion. There is no significant pleural effusion. Atherosclerosis of the thoracic aorta. Hepatobiliary: There are several new low-density hepatic lesions. Largest lesion is present posteriorly in the dome of the right lobe, measuring 3.6 x 3.2 cm on image 12. There are at least 4 other new lesions, including 1 measuring 10 mm in the dome of the right lobe on image 5. There are no lesions in the left lobe. No evidence of gallstones, gallbladder wall thickening or biliary dilatation. Pancreas: Unremarkable. No pancreatic ductal dilatation or surrounding inflammatory changes. Spleen: Normal in size without focal abnormality. Adrenals/Urinary Tract:  There is stable prominence of both adrenal glands without focal nodularity. 9 mm nonobstructing calculus in the mid left kidney is similar to the previous study. There is new right-sided hydronephrosis and hydroureter with delayed contrast excretion and decreased cortical enhancement of the right kidney. The right ureter appears dilated to the bladder. No evidence of ureteral calculus. There are probable small right renal cysts. Suprapubic bladder catheter appears well positioned. There is extensive, irregular bladder wall thickening which is likely due to diffuse infiltrating tumor. This presumed tumor is contiguous with the rectum posteriorly, and there is gross communication between the bladder and rectum, best seen on the sagittal images (image 51/7). Stomach/Bowel: The stomach and small bowel demonstrate no significant findings. There are postsurgical changes in the right colon. There is a diverting descending colostomy. There is diffuse wall thickening the rectum which communicates with the bladder as described above. Vascular/Lymphatic: There are no enlarged abdominal or pelvic lymph nodes. Aortic and branch vessel atherosclerosis. No acute vascular findings. Reproductive: As above, there is a large irregular mass encasing the bladder and rectum with an associated rectourethral fistula. There is probable inferior extent of tumor into the perineum. There are associated low-density components in the perineum, measuring up to 16 mm on coronal image 37/6 which may reflect necrotic tumor or a small urinoma. Probable edema involving the base of the penis. Other: No generalized ascites. Musculoskeletal: No acute or significant osseous findings. No evidence of osseous metastatic disease. IMPRESSION: 1. Marked progression of pelvic tumor involving the urinary bladder, rectum and perineum. 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 The suprapubic catheter appears adequately  positioned. 2. Probable obstruction of the distal right ureter by the pelvic tumor. There is decreased enhancement of the right kidney and delayed excretion. No hydronephrosis on the left. 3. New multifocal hepatic metastatic disease. No other signs of metastatic disease. 4. Diverting colostomy without evidence of bowel obstruction. There is wall thickening the distal colon attributed to the fistula and/or tumor. Electronically Signed   By: Richardean Sale M.D.   On: 09/07/2017 16:16    Procedures Procedures (including critical care time)  Medications Ordered in ED Medications  iopamidol (ISOVUE-300) 61 % injection (30 mLs  Contrast Given 09/07/17 1345)  iopamidol (ISOVUE-300) 61 % injection (100 mLs  Contrast Given 09/07/17 1502)     Initial  Impression / Assessment and Plan / ED Course  I have reviewed the triage vital signs and the nursing notes.  Pertinent labs & imaging results that were available during my care of the patient were reviewed by me and considered in my medical decision making (see chart for details).     Pt with hx/o prostate cancer complicated with rectouretheral fistula and abscess with suprapubic catheter in place.  He is a very poor historian but nontoxic on exam with no peritoneal findings.  There is no evidence of abdominal wall infection, no reports of fevers or vomiting.  During ED stay patient did produce urine in his catheter bag and able to flush small amount of fluid (1-2cc) through suprapubic cath site. Labs demonstrate stable renal function, CBC.  UA not sent as concern for contaminant due to colinization and patient has no concerning features for serious bacterial infection.   CT abd obtained with no evidence of urinary retention in bladder - does demonstrate progressive disease process as well as right sided hydro.   Discussed with Urologist on call for Dr. Hilbert Corrigan - plan to follow up in office tomorrow as scheduled, will send CD with CT images.   Discussed  with patient importance of returning to ED immediately if he develops severe pain, fevers, vomiting or new concerning sxs.    Discussed patient's findings, CT and lab studies as well as current concerns with patient's daughter over the phone.   Final Clinical Impressions(s) / ED Diagnoses   Final diagnoses:  Lower abdominal pain  Complaint about urinary catheter    ED Discharge Orders    None       Quintella Reichert, MD 09/07/17 2013

## 2017-09-07 NOTE — Discharge Instructions (Addendum)
You have fluid on your right kidney. Get rechecked immediately if you develop severe pain, vomiting, fevers, or new concerning symptoms.

## 2017-09-07 NOTE — ED Triage Notes (Signed)
Pt has supra pubic cath and dx with bacteria infection, has prostate cancer. Pt has not been ablet o void since 5pm yesterday. Blood is noted to be in bag. No appetite. Pain in bladder area.

## 2017-09-07 NOTE — ED Notes (Signed)
Bed: WA16 Expected date:  Expected time:  Means of arrival:  Comments: Resus A 

## 2017-09-08 DIAGNOSIS — K769 Liver disease, unspecified: Secondary | ICD-10-CM | POA: Diagnosis not present

## 2017-09-08 DIAGNOSIS — N1339 Other hydronephrosis: Secondary | ICD-10-CM | POA: Diagnosis not present

## 2017-09-08 DIAGNOSIS — R627 Adult failure to thrive: Secondary | ICD-10-CM | POA: Diagnosis not present

## 2017-09-08 DIAGNOSIS — T83090A Other mechanical complication of cystostomy catheter, initial encounter: Secondary | ICD-10-CM | POA: Diagnosis not present

## 2017-09-08 DIAGNOSIS — K6289 Other specified diseases of anus and rectum: Secondary | ICD-10-CM | POA: Diagnosis not present

## 2017-09-08 DIAGNOSIS — Z681 Body mass index (BMI) 19 or less, adult: Secondary | ICD-10-CM | POA: Diagnosis not present

## 2017-09-08 DIAGNOSIS — N529 Male erectile dysfunction, unspecified: Secondary | ICD-10-CM | POA: Diagnosis not present

## 2017-09-08 DIAGNOSIS — N321 Vesicointestinal fistula: Secondary | ICD-10-CM | POA: Diagnosis not present

## 2017-09-08 DIAGNOSIS — N99512 Cystostomy malfunction: Secondary | ICD-10-CM | POA: Diagnosis not present

## 2017-09-08 DIAGNOSIS — N360 Urethral fistula: Secondary | ICD-10-CM | POA: Diagnosis not present

## 2017-09-08 DIAGNOSIS — E43 Unspecified severe protein-calorie malnutrition: Secondary | ICD-10-CM | POA: Diagnosis not present

## 2017-09-08 DIAGNOSIS — N1330 Unspecified hydronephrosis: Secondary | ICD-10-CM | POA: Diagnosis not present

## 2017-09-08 DIAGNOSIS — C61 Malignant neoplasm of prostate: Secondary | ICD-10-CM | POA: Diagnosis not present

## 2017-09-08 DIAGNOSIS — Z8546 Personal history of malignant neoplasm of prostate: Secondary | ICD-10-CM | POA: Diagnosis not present

## 2017-09-08 DIAGNOSIS — N99518 Other cystostomy complication: Secondary | ICD-10-CM | POA: Diagnosis not present

## 2017-09-08 DIAGNOSIS — C787 Secondary malignant neoplasm of liver and intrahepatic bile duct: Secondary | ICD-10-CM | POA: Diagnosis not present

## 2017-09-15 DIAGNOSIS — N360 Urethral fistula: Secondary | ICD-10-CM | POA: Diagnosis not present

## 2017-09-16 DIAGNOSIS — I10 Essential (primary) hypertension: Secondary | ICD-10-CM | POA: Diagnosis not present

## 2017-09-16 DIAGNOSIS — N321 Vesicointestinal fistula: Secondary | ICD-10-CM | POA: Diagnosis not present

## 2017-09-16 DIAGNOSIS — C787 Secondary malignant neoplasm of liver and intrahepatic bile duct: Secondary | ICD-10-CM | POA: Diagnosis not present

## 2017-09-16 DIAGNOSIS — Z435 Encounter for attention to cystostomy: Secondary | ICD-10-CM | POA: Diagnosis not present

## 2017-09-16 DIAGNOSIS — N360 Urethral fistula: Secondary | ICD-10-CM | POA: Diagnosis not present

## 2017-09-16 DIAGNOSIS — Z466 Encounter for fitting and adjustment of urinary device: Secondary | ICD-10-CM | POA: Diagnosis not present

## 2017-09-17 DIAGNOSIS — Z466 Encounter for fitting and adjustment of urinary device: Secondary | ICD-10-CM | POA: Diagnosis not present

## 2017-09-17 DIAGNOSIS — N321 Vesicointestinal fistula: Secondary | ICD-10-CM | POA: Diagnosis not present

## 2017-09-17 DIAGNOSIS — I10 Essential (primary) hypertension: Secondary | ICD-10-CM | POA: Diagnosis not present

## 2017-09-17 DIAGNOSIS — C787 Secondary malignant neoplasm of liver and intrahepatic bile duct: Secondary | ICD-10-CM | POA: Diagnosis not present

## 2017-09-17 DIAGNOSIS — N360 Urethral fistula: Secondary | ICD-10-CM | POA: Diagnosis not present

## 2017-09-17 DIAGNOSIS — Z435 Encounter for attention to cystostomy: Secondary | ICD-10-CM | POA: Diagnosis not present

## 2017-09-18 DIAGNOSIS — I10 Essential (primary) hypertension: Secondary | ICD-10-CM | POA: Diagnosis not present

## 2017-09-18 DIAGNOSIS — Z466 Encounter for fitting and adjustment of urinary device: Secondary | ICD-10-CM | POA: Diagnosis not present

## 2017-09-18 DIAGNOSIS — N360 Urethral fistula: Secondary | ICD-10-CM | POA: Diagnosis not present

## 2017-09-18 DIAGNOSIS — Z435 Encounter for attention to cystostomy: Secondary | ICD-10-CM | POA: Diagnosis not present

## 2017-09-18 DIAGNOSIS — C787 Secondary malignant neoplasm of liver and intrahepatic bile duct: Secondary | ICD-10-CM | POA: Diagnosis not present

## 2017-09-18 DIAGNOSIS — N321 Vesicointestinal fistula: Secondary | ICD-10-CM | POA: Diagnosis not present

## 2017-09-19 DIAGNOSIS — Z466 Encounter for fitting and adjustment of urinary device: Secondary | ICD-10-CM | POA: Diagnosis not present

## 2017-09-19 DIAGNOSIS — N321 Vesicointestinal fistula: Secondary | ICD-10-CM | POA: Diagnosis not present

## 2017-09-19 DIAGNOSIS — Z435 Encounter for attention to cystostomy: Secondary | ICD-10-CM | POA: Diagnosis not present

## 2017-09-19 DIAGNOSIS — I10 Essential (primary) hypertension: Secondary | ICD-10-CM | POA: Diagnosis not present

## 2017-09-19 DIAGNOSIS — N360 Urethral fistula: Secondary | ICD-10-CM | POA: Diagnosis not present

## 2017-09-19 DIAGNOSIS — C787 Secondary malignant neoplasm of liver and intrahepatic bile duct: Secondary | ICD-10-CM | POA: Diagnosis not present

## 2017-09-23 DIAGNOSIS — Z435 Encounter for attention to cystostomy: Secondary | ICD-10-CM | POA: Diagnosis not present

## 2017-09-23 DIAGNOSIS — N321 Vesicointestinal fistula: Secondary | ICD-10-CM | POA: Diagnosis not present

## 2017-09-23 DIAGNOSIS — C787 Secondary malignant neoplasm of liver and intrahepatic bile duct: Secondary | ICD-10-CM | POA: Diagnosis not present

## 2017-09-23 DIAGNOSIS — I10 Essential (primary) hypertension: Secondary | ICD-10-CM | POA: Diagnosis not present

## 2017-09-23 DIAGNOSIS — N360 Urethral fistula: Secondary | ICD-10-CM | POA: Diagnosis not present

## 2017-09-23 DIAGNOSIS — Z466 Encounter for fitting and adjustment of urinary device: Secondary | ICD-10-CM | POA: Diagnosis not present

## 2017-09-24 DIAGNOSIS — I10 Essential (primary) hypertension: Secondary | ICD-10-CM | POA: Diagnosis not present

## 2017-09-24 DIAGNOSIS — N321 Vesicointestinal fistula: Secondary | ICD-10-CM | POA: Diagnosis not present

## 2017-09-24 DIAGNOSIS — C787 Secondary malignant neoplasm of liver and intrahepatic bile duct: Secondary | ICD-10-CM | POA: Diagnosis not present

## 2017-09-24 DIAGNOSIS — N360 Urethral fistula: Secondary | ICD-10-CM | POA: Diagnosis not present

## 2017-09-24 DIAGNOSIS — Z466 Encounter for fitting and adjustment of urinary device: Secondary | ICD-10-CM | POA: Diagnosis not present

## 2017-09-24 DIAGNOSIS — Z435 Encounter for attention to cystostomy: Secondary | ICD-10-CM | POA: Diagnosis not present

## 2017-09-25 DIAGNOSIS — N360 Urethral fistula: Secondary | ICD-10-CM | POA: Diagnosis not present

## 2017-09-26 DIAGNOSIS — N360 Urethral fistula: Secondary | ICD-10-CM | POA: Diagnosis not present

## 2017-09-26 DIAGNOSIS — Z466 Encounter for fitting and adjustment of urinary device: Secondary | ICD-10-CM | POA: Diagnosis not present

## 2017-09-26 DIAGNOSIS — Z435 Encounter for attention to cystostomy: Secondary | ICD-10-CM | POA: Diagnosis not present

## 2017-09-26 DIAGNOSIS — C787 Secondary malignant neoplasm of liver and intrahepatic bile duct: Secondary | ICD-10-CM | POA: Diagnosis not present

## 2017-09-26 DIAGNOSIS — I10 Essential (primary) hypertension: Secondary | ICD-10-CM | POA: Diagnosis not present

## 2017-09-26 DIAGNOSIS — N321 Vesicointestinal fistula: Secondary | ICD-10-CM | POA: Diagnosis not present

## 2017-09-29 DIAGNOSIS — Z435 Encounter for attention to cystostomy: Secondary | ICD-10-CM | POA: Diagnosis not present

## 2017-09-29 DIAGNOSIS — N360 Urethral fistula: Secondary | ICD-10-CM | POA: Diagnosis not present

## 2017-09-29 DIAGNOSIS — C787 Secondary malignant neoplasm of liver and intrahepatic bile duct: Secondary | ICD-10-CM | POA: Diagnosis not present

## 2017-09-29 DIAGNOSIS — Z466 Encounter for fitting and adjustment of urinary device: Secondary | ICD-10-CM | POA: Diagnosis not present

## 2017-09-29 DIAGNOSIS — I10 Essential (primary) hypertension: Secondary | ICD-10-CM | POA: Diagnosis not present

## 2017-09-29 DIAGNOSIS — N321 Vesicointestinal fistula: Secondary | ICD-10-CM | POA: Diagnosis not present

## 2017-09-30 ENCOUNTER — Other Ambulatory Visit: Payer: Self-pay

## 2017-09-30 ENCOUNTER — Encounter (HOSPITAL_COMMUNITY): Payer: Self-pay | Admitting: Internal Medicine

## 2017-09-30 ENCOUNTER — Emergency Department (HOSPITAL_COMMUNITY): Payer: Medicare HMO

## 2017-09-30 ENCOUNTER — Inpatient Hospital Stay (HOSPITAL_COMMUNITY)
Admission: EM | Admit: 2017-09-30 | Discharge: 2017-10-08 | DRG: 673 | Disposition: A | Discharge status: Hospice | Payer: Medicare HMO | Attending: Family Medicine | Admitting: Family Medicine

## 2017-09-30 DIAGNOSIS — Z960 Presence of urogenital implants: Secondary | ICD-10-CM | POA: Diagnosis present

## 2017-09-30 DIAGNOSIS — Z66 Do not resuscitate: Secondary | ICD-10-CM | POA: Diagnosis not present

## 2017-09-30 DIAGNOSIS — Z933 Colostomy status: Secondary | ICD-10-CM

## 2017-09-30 DIAGNOSIS — R64 Cachexia: Secondary | ICD-10-CM | POA: Diagnosis present

## 2017-09-30 DIAGNOSIS — N139 Obstructive and reflux uropathy, unspecified: Secondary | ICD-10-CM | POA: Diagnosis present

## 2017-09-30 DIAGNOSIS — D630 Anemia in neoplastic disease: Secondary | ICD-10-CM | POA: Diagnosis present

## 2017-09-30 DIAGNOSIS — Z87442 Personal history of urinary calculi: Secondary | ICD-10-CM

## 2017-09-30 DIAGNOSIS — Z436 Encounter for attention to other artificial openings of urinary tract: Secondary | ICD-10-CM | POA: Diagnosis not present

## 2017-09-30 DIAGNOSIS — N1330 Unspecified hydronephrosis: Secondary | ICD-10-CM

## 2017-09-30 DIAGNOSIS — Z7189 Other specified counseling: Secondary | ICD-10-CM | POA: Diagnosis not present

## 2017-09-30 DIAGNOSIS — Z515 Encounter for palliative care: Secondary | ICD-10-CM | POA: Diagnosis not present

## 2017-09-30 DIAGNOSIS — E870 Hyperosmolality and hypernatremia: Secondary | ICD-10-CM | POA: Diagnosis not present

## 2017-09-30 DIAGNOSIS — C61 Malignant neoplasm of prostate: Secondary | ICD-10-CM | POA: Diagnosis present

## 2017-09-30 DIAGNOSIS — N179 Acute kidney failure, unspecified: Secondary | ICD-10-CM | POA: Diagnosis not present

## 2017-09-30 DIAGNOSIS — E43 Unspecified severe protein-calorie malnutrition: Secondary | ICD-10-CM | POA: Diagnosis not present

## 2017-09-30 DIAGNOSIS — I712 Thoracic aortic aneurysm, without rupture: Secondary | ICD-10-CM | POA: Diagnosis present

## 2017-09-30 DIAGNOSIS — Z79890 Hormone replacement therapy (postmenopausal): Secondary | ICD-10-CM

## 2017-09-30 DIAGNOSIS — D649 Anemia, unspecified: Secondary | ICD-10-CM | POA: Diagnosis present

## 2017-09-30 DIAGNOSIS — I10 Essential (primary) hypertension: Secondary | ICD-10-CM | POA: Diagnosis not present

## 2017-09-30 DIAGNOSIS — E876 Hypokalemia: Secondary | ICD-10-CM | POA: Diagnosis not present

## 2017-09-30 DIAGNOSIS — G9389 Other specified disorders of brain: Secondary | ICD-10-CM | POA: Diagnosis not present

## 2017-09-30 DIAGNOSIS — C7951 Secondary malignant neoplasm of bone: Secondary | ICD-10-CM | POA: Diagnosis not present

## 2017-09-30 DIAGNOSIS — Z681 Body mass index (BMI) 19 or less, adult: Secondary | ICD-10-CM

## 2017-09-30 DIAGNOSIS — E860 Dehydration: Secondary | ICD-10-CM | POA: Diagnosis present

## 2017-09-30 DIAGNOSIS — N390 Urinary tract infection, site not specified: Secondary | ICD-10-CM | POA: Diagnosis not present

## 2017-09-30 DIAGNOSIS — Z9359 Other cystostomy status: Secondary | ICD-10-CM

## 2017-09-30 DIAGNOSIS — G8929 Other chronic pain: Secondary | ICD-10-CM | POA: Diagnosis not present

## 2017-09-30 DIAGNOSIS — N360 Urethral fistula: Secondary | ICD-10-CM | POA: Diagnosis present

## 2017-09-30 DIAGNOSIS — B965 Pseudomonas (aeruginosa) (mallei) (pseudomallei) as the cause of diseases classified elsewhere: Secondary | ICD-10-CM | POA: Diagnosis present

## 2017-09-30 DIAGNOSIS — Z9049 Acquired absence of other specified parts of digestive tract: Secondary | ICD-10-CM

## 2017-09-30 DIAGNOSIS — N3289 Other specified disorders of bladder: Secondary | ICD-10-CM | POA: Diagnosis not present

## 2017-09-30 DIAGNOSIS — F1011 Alcohol abuse, in remission: Secondary | ICD-10-CM | POA: Diagnosis present

## 2017-09-30 DIAGNOSIS — J449 Chronic obstructive pulmonary disease, unspecified: Secondary | ICD-10-CM | POA: Diagnosis not present

## 2017-09-30 DIAGNOSIS — R531 Weakness: Secondary | ICD-10-CM | POA: Diagnosis present

## 2017-09-30 DIAGNOSIS — C787 Secondary malignant neoplasm of liver and intrahepatic bile duct: Secondary | ICD-10-CM | POA: Diagnosis not present

## 2017-09-30 DIAGNOSIS — R627 Adult failure to thrive: Secondary | ICD-10-CM | POA: Diagnosis not present

## 2017-09-30 DIAGNOSIS — C414 Malignant neoplasm of pelvic bones, sacrum and coccyx: Secondary | ICD-10-CM | POA: Diagnosis not present

## 2017-09-30 DIAGNOSIS — N321 Vesicointestinal fistula: Secondary | ICD-10-CM | POA: Diagnosis not present

## 2017-09-30 DIAGNOSIS — N400 Enlarged prostate without lower urinary tract symptoms: Secondary | ICD-10-CM | POA: Diagnosis not present

## 2017-09-30 DIAGNOSIS — Z79899 Other long term (current) drug therapy: Secondary | ICD-10-CM

## 2017-09-30 DIAGNOSIS — N132 Hydronephrosis with renal and ureteral calculous obstruction: Secondary | ICD-10-CM | POA: Diagnosis present

## 2017-09-30 DIAGNOSIS — J439 Emphysema, unspecified: Secondary | ICD-10-CM | POA: Diagnosis not present

## 2017-09-30 DIAGNOSIS — R1900 Intra-abdominal and pelvic swelling, mass and lump, unspecified site: Secondary | ICD-10-CM

## 2017-09-30 DIAGNOSIS — R5381 Other malaise: Secondary | ICD-10-CM | POA: Diagnosis present

## 2017-09-30 DIAGNOSIS — Z8249 Family history of ischemic heart disease and other diseases of the circulatory system: Secondary | ICD-10-CM

## 2017-09-30 DIAGNOSIS — Z923 Personal history of irradiation: Secondary | ICD-10-CM

## 2017-09-30 DIAGNOSIS — B961 Klebsiella pneumoniae [K. pneumoniae] as the cause of diseases classified elsewhere: Secondary | ICD-10-CM | POA: Diagnosis not present

## 2017-09-30 DIAGNOSIS — M899 Disorder of bone, unspecified: Secondary | ICD-10-CM | POA: Diagnosis not present

## 2017-09-30 DIAGNOSIS — Z87891 Personal history of nicotine dependence: Secondary | ICD-10-CM

## 2017-09-30 DIAGNOSIS — N183 Chronic kidney disease, stage 3 (moderate): Secondary | ICD-10-CM | POA: Diagnosis not present

## 2017-09-30 LAB — URINALYSIS, ROUTINE W REFLEX MICROSCOPIC
BILIRUBIN URINE: NEGATIVE
Glucose, UA: NEGATIVE mg/dL
Ketones, ur: NEGATIVE mg/dL
NITRITE: NEGATIVE
PH: 9 — AB (ref 5.0–8.0)
Protein, ur: 100 mg/dL — AB
SPECIFIC GRAVITY, URINE: 1.009 (ref 1.005–1.030)
Squamous Epithelial / LPF: NONE SEEN

## 2017-09-30 LAB — COMPREHENSIVE METABOLIC PANEL
ALK PHOS: 84 U/L (ref 38–126)
ALT: 10 U/L — ABNORMAL LOW (ref 17–63)
ANION GAP: 13 (ref 5–15)
AST: 11 U/L — ABNORMAL LOW (ref 15–41)
Albumin: 3.2 g/dL — ABNORMAL LOW (ref 3.5–5.0)
BILIRUBIN TOTAL: 0.4 mg/dL (ref 0.3–1.2)
BUN: 73 mg/dL — ABNORMAL HIGH (ref 6–20)
CALCIUM: 15 mg/dL — AB (ref 8.9–10.3)
CO2: 19 mmol/L — ABNORMAL LOW (ref 22–32)
Chloride: 110 mmol/L (ref 101–111)
Creatinine, Ser: 3.53 mg/dL — ABNORMAL HIGH (ref 0.61–1.24)
GFR calc Af Amer: 18 mL/min — ABNORMAL LOW (ref >60)
GFR, EST NON AFRICAN AMERICAN: 16 mL/min — AB (ref >60)
Glucose, Bld: 100 mg/dL — ABNORMAL HIGH (ref 65–99)
Potassium: 3.4 mmol/L — ABNORMAL LOW (ref 3.5–5.1)
Sodium: 142 mmol/L (ref 135–145)
TOTAL PROTEIN: 8.4 g/dL — AB (ref 6.5–8.1)

## 2017-09-30 LAB — CBC
HCT: 37.2 % — ABNORMAL LOW (ref 39.0–52.0)
HEMOGLOBIN: 12.2 g/dL — AB (ref 13.0–17.0)
MCH: 29.3 pg (ref 26.0–34.0)
MCHC: 32.8 g/dL (ref 30.0–36.0)
MCV: 89.4 fL (ref 78.0–100.0)
Platelets: 374 10*3/uL (ref 150–400)
RBC: 4.16 MIL/uL — ABNORMAL LOW (ref 4.22–5.81)
RDW: 12.6 % (ref 11.5–15.5)
WBC: 10.2 10*3/uL (ref 4.0–10.5)

## 2017-09-30 LAB — LIPASE, BLOOD: Lipase: 18 U/L (ref 11–51)

## 2017-09-30 MED ORDER — VITAMIN B-1 100 MG PO TABS
100.0000 mg | ORAL_TABLET | Freq: Every day | ORAL | Status: DC
  Filled 2017-09-30: qty 1
  Filled 2017-09-30 (×4): qty 2
  Filled 2017-09-30: qty 1

## 2017-09-30 MED ORDER — ONDANSETRON HCL 4 MG PO TABS: 4.0000 mg | ORAL_TABLET | Freq: Four times a day (QID) | ORAL | Status: DC | PRN

## 2017-09-30 MED ORDER — TAMSULOSIN HCL 0.4 MG PO CAPS
0.4000 mg | ORAL_CAPSULE | Freq: Every day | ORAL | Status: DC
  Administered 2017-10-01 – 2017-10-07 (×3): 0.4 mg via ORAL
  Filled 2017-09-30 (×4): qty 1

## 2017-09-30 MED ORDER — SODIUM CHLORIDE 0.9 % IV BOLUS
1000.0000 mL | Freq: Once | INTRAVENOUS | Status: AC
  Administered 2017-09-30: 1000 mL via INTRAVENOUS

## 2017-09-30 MED ORDER — ENSURE ENLIVE PO LIQD
237.0000 mL | Freq: Two times a day (BID) | ORAL | Status: DC
  Administered 2017-10-02 – 2017-10-05 (×4): 237 mL via ORAL

## 2017-09-30 MED ORDER — SODIUM CHLORIDE 0.9% FLUSH
3.0000 mL | Freq: Two times a day (BID) | INTRAVENOUS | Status: DC
  Administered 2017-09-30 – 2017-10-04 (×7): 3 mL via INTRAVENOUS

## 2017-09-30 MED ORDER — METOPROLOL SUCCINATE ER 100 MG PO TB24
100.0000 mg | ORAL_TABLET | Freq: Every day | ORAL | Status: DC
  Administered 2017-10-01 – 2017-10-08 (×4): 100 mg via ORAL
  Filled 2017-09-30 (×8): qty 1

## 2017-09-30 MED ORDER — ACETAMINOPHEN 325 MG PO TABS: 650.0000 mg | ORAL_TABLET | Freq: Four times a day (QID) | ORAL | Status: DC | PRN

## 2017-09-30 MED ORDER — SODIUM CHLORIDE 0.9 % IV SOLN
INTRAVENOUS | Status: DC
  Administered 2017-09-30 – 2017-10-02 (×4): via INTRAVENOUS

## 2017-09-30 MED ORDER — VITAMIN D 1000 UNITS PO TABS
2000.0000 [IU] | ORAL_TABLET | Freq: Every day | ORAL | Status: DC
  Administered 2017-10-01 – 2017-10-02 (×2): 2000 [IU] via ORAL
  Filled 2017-09-30 (×4): qty 2

## 2017-09-30 MED ORDER — ACETAMINOPHEN 650 MG RE SUPP: 650.0000 mg | Freq: Four times a day (QID) | RECTAL | Status: DC | PRN

## 2017-09-30 MED ORDER — TRAZODONE HCL 50 MG PO TABS
50.0000 mg | ORAL_TABLET | Freq: Every evening | ORAL | Status: DC | PRN
  Administered 2017-10-02: 50 mg via ORAL
  Filled 2017-09-30 (×2): qty 1

## 2017-09-30 MED ORDER — SODIUM CHLORIDE 0.9 % IV SOLN: 250.0000 mL | INTRAVENOUS | Status: DC | PRN

## 2017-09-30 MED ORDER — SODIUM CHLORIDE 0.9 % IV SOLN
1.0000 g | INTRAVENOUS | Status: DC
  Administered 2017-09-30 – 2017-10-01 (×2): 1 g via INTRAVENOUS
  Filled 2017-09-30: qty 10
  Filled 2017-09-30: qty 1

## 2017-09-30 MED ORDER — ADULT MULTIVITAMIN W/MINERALS CH
1.0000 | ORAL_TABLET | Freq: Every day | ORAL | Status: DC
  Administered 2017-10-01 – 2017-10-02 (×2): 1 via ORAL
  Filled 2017-09-30 (×4): qty 1

## 2017-09-30 MED ORDER — FOLIC ACID 1 MG PO TABS
1.0000 mg | ORAL_TABLET | Freq: Every day | ORAL | Status: DC
  Administered 2017-10-01 – 2017-10-02 (×2): 1 mg via ORAL
  Filled 2017-09-30 (×4): qty 1

## 2017-09-30 MED ORDER — POTASSIUM CHLORIDE CRYS ER 10 MEQ PO TBCR: 40.0000 meq | EXTENDED_RELEASE_TABLET | Freq: Once | ORAL | Status: DC

## 2017-09-30 MED ORDER — SODIUM CHLORIDE 0.9% FLUSH: 3.0000 mL | INTRAVENOUS | Status: DC | PRN

## 2017-09-30 MED ORDER — CALCITONIN (SALMON) 200 UNIT/ML IJ SOLN
4.0000 [IU]/kg | Freq: Two times a day (BID) | INTRAMUSCULAR | Status: AC
  Administered 2017-09-30 – 2017-10-01 (×3): 200 [IU] via SUBCUTANEOUS
  Filled 2017-09-30 (×4): qty 1

## 2017-09-30 MED ORDER — SENNA 8.6 MG PO TABS
1.0000 | ORAL_TABLET | Freq: Two times a day (BID) | ORAL | Status: DC
  Administered 2017-10-01 – 2017-10-07 (×7): 8.6 mg via ORAL
  Filled 2017-09-30 (×9): qty 1

## 2017-09-30 MED ORDER — SODIUM CHLORIDE 0.9 % IV SOLN
30.0000 mg | Freq: Once | INTRAVENOUS | Status: AC
  Administered 2017-09-30: 30 mg via INTRAVENOUS
  Filled 2017-09-30: qty 10

## 2017-09-30 MED ORDER — ALBUTEROL SULFATE (2.5 MG/3ML) 0.083% IN NEBU: 2.5000 mg | INHALATION_SOLUTION | RESPIRATORY_TRACT | Status: DC | PRN

## 2017-09-30 MED ORDER — MORPHINE SULFATE (PF) 2 MG/ML IV SOLN: 2.0000 mg | INTRAVENOUS | Status: AC | PRN

## 2017-09-30 MED ORDER — POLYETHYLENE GLYCOL 3350 17 G PO PACK: 17.0000 g | PACK | Freq: Every day | ORAL | Status: DC | PRN

## 2017-09-30 MED ORDER — ONDANSETRON HCL 4 MG/2ML IJ SOLN
4.0000 mg | Freq: Four times a day (QID) | INTRAMUSCULAR | Status: DC | PRN
  Administered 2017-10-01: 4 mg via INTRAVENOUS
  Filled 2017-09-30 (×2): qty 2

## 2017-09-30 MED ORDER — HYDRALAZINE HCL 20 MG/ML IJ SOLN: 10.0000 mg | Freq: Four times a day (QID) | INTRAMUSCULAR | Status: DC | PRN

## 2017-09-30 MED ORDER — OXYCODONE HCL 5 MG PO TABS
5.0000 mg | ORAL_TABLET | ORAL | Status: DC | PRN
  Administered 2017-10-02 – 2017-10-08 (×8): 5 mg via ORAL
  Filled 2017-09-30 (×8): qty 1

## 2017-09-30 MED ORDER — FINASTERIDE 5 MG PO TABS
5.0000 mg | ORAL_TABLET | Freq: Every day | ORAL | Status: DC
  Administered 2017-10-01 – 2017-10-07 (×3): 5 mg via ORAL
  Filled 2017-09-30 (×4): qty 1

## 2017-09-30 MED ORDER — AMLODIPINE BESYLATE 5 MG PO TABS
10.0000 mg | ORAL_TABLET | Freq: Every day | ORAL | Status: DC
  Administered 2017-10-01 – 2017-10-08 (×5): 10 mg via ORAL
  Filled 2017-09-30 (×5): qty 2

## 2017-09-30 MED ORDER — HEPARIN SODIUM (PORCINE) 5000 UNIT/ML IJ SOLN
5000.0000 [IU] | Freq: Three times a day (TID) | INTRAMUSCULAR | Status: DC
  Administered 2017-09-30 – 2017-10-01 (×2): 5000 [IU] via SUBCUTANEOUS
  Filled 2017-09-30: qty 1

## 2017-09-30 MED ORDER — LEVOTHYROXINE SODIUM 100 MCG PO TABS
100.0000 ug | ORAL_TABLET | Freq: Every day | ORAL | Status: DC
  Administered 2017-10-01 – 2017-10-08 (×4): 100 ug via ORAL
  Filled 2017-09-30 (×5): qty 1

## 2017-09-30 NOTE — ED Notes (Signed)
Patient has [facility] home health service 2 times per week. Daughter states that he has not gotten out of bed in three weeks and is unfit to take care of himself at home.
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
## 2017-09-30 NOTE — ED Notes (Signed)
Bed: WA21 Expected date:  Expected time:  Means of arrival:  Comments: EMS weakness 
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
## 2017-09-30 NOTE — ED Notes (Signed)
Di Kindle, RN had 2 unsuccessful attempts to gain IV access. IV team consult placed to gain IV acces and draw labs.

## 2017-09-30 NOTE — ED Provider Notes (Addendum)
[place] DEPT Provider Note   CSN: [id number] Arrival date & time: 09/30/17  1037     History   Chief Complaint Chief Complaint  Patient presents with  . Weakness  . Altered Mental Status    HPI Ricardo Johnson is a 72 y.o. male.  HPI Patient is a 72 year old male with a history of prostate cancer and now what sounds like metastatic cancer.  He is being followed by the urology team at [facility].  Best I can tell and in discussions with his daughter he has not yet seen oncology.  He was recently hospitalized several weeks ago at [facility] for failure to thrive.  He has not had a palliative care consultation.  He is brought to the emergency department by his daughter for increasing generalized weakness and some increased confusion over the past several weeks.  He continues to have decreased appetite and continues to lose weight.  His symptoms have been worsening over the past several weeks.  Patient denies significant pain at this time.  He denies weakness of his unilateral arm or leg.  Daughter reports the patient has not been out of bed in the past 3 weeks and she is having increasing difficulty caring for him at home   Past Medical History:  Diagnosis Date  . Alcohol abuse   . BPH (benign prostatic hypertrophy) 2000-2001  . Diabetes mellitus without complication ([place])    pt reports that they were checking him during last admission for DM but never started any medicine or made any referrals for F/U  . History of kidney stones   . History of nephrolithiasis   . Hypertension   . Prostate cancer ([place]) 2005  . Tobacco abuse     Patient Active Problem List   Diagnosis Date Noted  . 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 Suprapubic catheter in place 03/31/2017  . Rectal pain 03/31/2017  . UTI (urinary tract infection) 08/07/2016  . Acute renal failure (ARF) ([place]) 08/07/2016  . Anemia 08/07/2016  . Hyperglycemia 08/07/2016    . Hypokalemia 08/07/2016  . Syncope 08/07/2016  . Pain in the chest   . AKI (acute kidney injury) ([place])   . Noncompliance   . Chest pain 01/25/2015  . Accelerated hypertension 01/25/2015  . ADENOCARCINOMA, PROSTATE 02/23/2010  . ALCOHOL ABUSE 02/23/2010  . TOBACCO ABUSE 02/23/2010  . Essential hypertension 02/23/2010  . SYNCOPE 02/23/2010  . BENIGN PROSTATIC HYPERTROPHY, HX OF 02/23/2010    Past Surgical History:  Procedure Laterality Date  . APPENDECTOMY    . CYSTOSCOPY N/A 08/05/2016   Procedure: CYSTOSCOPY, URETHROSCOPY;  Surgeon: Raynelle Bring, MD;  Location: [facility] ORS;  Service: Urology;  Laterality: N/A;  . CYSTOSCOPY N/A 09/05/2016   Procedure: CYSTOSCOPY, URETHROSCOPY, BALLOON DILATION OF SUPRAPUBIC TRACK, PLACEMENT OF 14FR SUPRAPUIC CATHETER;  Surgeon: Raynelle Bring, MD;  Location: [facility];  Service: Urology;  Laterality: N/A;  . LITHOTRIPSY    . RADIOACTIVE SEED IMPLANT     for prostate cancer        Home Medications    Prior to Admission medications   Medication Sig Start Date End Date Taking? Authorizing Provider  amLODipine (NORVASC) 10 MG tablet Take 10 mg by mouth daily.  08/25/15   [provider]  Cholecalciferol (VITAMIN D) 2000 units CAPS Take 2,000 Units by mouth daily.     [provider]  finasteride (PROSCAR) 5 MG tablet TAKE 1 TABLET BY MOUTH EVERY DAY Patient taking differently: TAKE  1 TABLET (5mg ) BY MOUTH EVERY DAY 01/04/11   Kalia-Reynolds, Maitri S, DO  folic acid (FOLVITE) 1 MG tablet Take 1 tablet (1 mg total) by mouth daily. 08/11/16   Modena Jansky, MD  levothyroxine (SYNTHROID, LEVOTHROID) 100 MCG tablet Take 100 mcg by mouth daily before breakfast. 07/16/16   [provider]  lisinopril-hydrochlorothiazide (PRINZIDE,ZESTORETIC) 20-25 MG tablet Take 1 tablet by mouth daily. 04/22/16   [provider]  metoprolol succinate (TOPROL-XL) 100 MG 24 hr tablet Take 100 mg by mouth daily. 08/25/15   [provider]  Multiple Vitamin (MULTIVITAMIN WITH MINERALS) TABS tablet Take 1 tablet by mouth daily. 08/11/16   Hongalgi, Lenis Dickinson, MD  naproxen sodium (ALEVE) 220 MG tablet Take 220 mg by mouth 2 (two) times daily as needed (pain).    [provider]  Tamsulosin HCl (FLOMAX) 0.4 MG CAPS TAKE ONE CAPSULE BY MOUTH EVERY DAY Patient taking differently: TAKE ONE CAPSULE (0.4mg ) BY MOUTH EVERY DAY 11/27/10   Kalia-Reynolds, Maitri S, DO  thiamine 100 MG tablet Take 1 tablet (100 mg total) by mouth daily. 08/11/16   Modena Jansky, MD    Family History Family History  Problem Relation Age of Onset  . Heart disease Mother   . Heart disease Father   . Cancer Cousin     Social History Social History   Tobacco Use  . Smoking status: Former Smoker    Last attempt to quit: 01/30/2015    Years since quitting: 2.6  . Smokeless tobacco: Never Used  Substance Use Topics  . Alcohol use: No    Alcohol/week: 1.2 oz    Types: 1 Cans of beer, 1 Shots of liquor per week    Comment: "not since i got sick "  . Drug use: No     Allergies   Patient has no known allergies.   Review of Systems Review of Systems  All other systems reviewed and are negative.    Physical Exam Updated Vital Signs BP 121/68   Pulse 74   Temp 97.7 F (36.5 C) (Oral)   Resp 16   SpO2 100%   Physical Exam  Constitutional: He appears well-developed and well-nourished.  Cachectic.  Chronically ill-appearing.  HENT:  Head: Normocephalic and atraumatic.  Eyes: EOM are normal.  Neck: Normal range of motion.  Cardiovascular: Normal rate and regular rhythm.  Pulmonary/Chest: Effort normal and breath sounds normal. No respiratory distress.  Abdominal: Soft. He exhibits no distension. There is no tenderness.  Musculoskeletal:  Full range of motion of major joints of both bilateral upper and lower extremities  Neurological: He is alert.  Oriented to person and place but not time  Skin: Skin is warm and dry.    Psychiatric: He has a normal mood and affect. Judgment normal.  Nursing note and vitals reviewed.    ED Treatments / Results  Labs (all labs ordered are listed, but only abnormal results are displayed) Labs Reviewed  CBC - Abnormal; Notable for the following components:      Result Value   RBC 4.16 (*)    Hemoglobin 12.2 (*)    HCT 37.2 (*)    All other components within normal limits  COMPREHENSIVE METABOLIC PANEL - Abnormal; Notable for the following components:   Potassium 3.4 (*)    CO2 19 (*)    Glucose, Bld 100 (*)    BUN 73 (*)    Creatinine, Ser 3.53 (*)    Calcium 15.0 (*)  Total Protein 8.4 (*)    Albumin 3.2 (*)    AST 11 (*)    ALT 10 (*)    GFR calc non Af Amer 16 (*)    GFR calc Af Amer 18 (*)    All other components within normal limits  LIPASE, BLOOD  URINALYSIS, ROUTINE W REFLEX MICROSCOPIC    EKG None  Radiology No results found.  Procedures Procedures (including critical care time)  Medications Ordered in ED Medications  sodium chloride 0.9 % bolus 1,000 mL (1,000 mLs Intravenous New Bag/Given 09/30/17 1458)     Initial Impression / Assessment and Plan / ED Course  I have reviewed the triage vital signs and the nursing notes.  Pertinent labs & imaging results that were available during my care of the patient were reviewed by me and considered in my medical decision making (see chart for details).     Failure to thrive type picture.  Likely metastatic cancer.  Likely progressive.  At some point he will need to be seen by an oncologist.  This likely is best done at [facility] where his urology team [facility].  At this time will provide IV hydration and check labs.  He will undergo CT imaging of his head, chest, abdomen pelvis for staging purposes.  I had a prolonged discussion with the patient's daughter who is informed.  He appears to be dwindling and seems clinically dehydrated at this time.  Daughter is understanding that if no clear etiology or  lab abnormality can be found today the likely he will need to follow-up with oncology as an outpatient and we will try to increase the home health services provided.  3:48 PM Patient with evidence of profound hypercalcemia and acute kidney injury.  Patient will be admitted for IV hydration and management of his acute kidney failure.  Patient will also benefit from oncology consultation as well as for possible palliative care consultation for goals of care given what appears to be advanced metastatic cancer.   Final Clinical Impressions(s) / ED Diagnoses   Final diagnoses:  AKI (acute kidney injury) [facility])  Hypercalcemia    ED Discharge Orders    None       Jola Schmidt, MD 09/30/17 1542    Jola Schmidt, MD 09/30/17 1549

## 2017-09-30 NOTE — ED Triage Notes (Signed)
Pt arrived via [facility] from home c/o weakness. Pt's daughter called EMS for patient because he "is not acting at baseline." Per daughter, patient has had decreased appetite and that he has had AMS x3 weeks. Per EMS, patient's speech delay is normal at baseline.

## 2017-09-30 NOTE — ED Notes (Signed)
Patient transported to CT 
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
## 2017-09-30 NOTE — ED Notes (Signed)
This RN attempted to gain IV access x2 unsuccessfully. Will ask another RN to attempt to gain IV access.

## 2017-09-30 NOTE — ED Notes (Signed)
IV team at the bedside. 

## 2017-09-30 NOTE — Consult Note (Signed)
H&P Physician requesting consult: Ricardo Gambler, MD  Chief Complaint: Acute renal insufficiency  History of Present Illness: 72 year old male with a history of prostate cancer which was low risk clinical stage T1c Gleason 3+3 status post brachytherapy in 2005.  PSA nadir was 0.03.  Last PSA in March was undetectable.  This was performed at [facility].  His primary urologist is now Dr. Francesca Johnson and he receives all of his care at [facility] now except for his urgent care needs.  He was last seen by Dr. Francesca Johnson in February 2019.  Back in October 2018, he had a colostomy and concurrently Dr. Francesca Johnson performed a cystoscopy and examination under anesthesia.  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  Biopsies of the fistula tract were benign.  He had an obliterated urethra.  His suprapubic tube has been getting exchanged by interventional radiology at [facility].  On review of outside records, the patient underwent a CT of the abdomen and pelvis with contrast on 07/01/2017 at [facility].  I do not have images for review but radiology read reveals no hydronephrosis and a portion of the read is as follows:  KIDNEYS/URETERS: 0.9 cm nonobstructing calculus in the interpolar left kidney, similar to prior (2:27). No hydronephrosis. Subcentimeter renal hypodensities, too small to characterize.  BLADDER/PROSTATE: Soft tissue tract extending from the anterior body wall to the anterior bladder, from prior suprapubic catheter, now removed. The bladder remains diffusely thick-walled. In the prostate, there is an ill-defined thick-walled lesion with peripheral enhancement and central fluid collection. A few locules of gas are seen within this collection, which measures 4.4 x 3.1 cm (2:71, previously 4.0 x 3.2 cm). A few brachytherapy seeds remain in the prostate, but most are no longer present. Loss of fat plane between the posterior prostate and the anterior wall of the rectum, compatible with known rectourethral fistula.  He had a  CT scan performed here on 09/08/2017 that revealed concern for progression of pelvic tumor and redemonstrated the large rectourethral fistula.  There was right-sided hydronephrosis.  There was no left-sided hydronephrosis.  Patient presented today with his daughter after several weeks of feeling ill and altered mental status.  He had no leukocytosis.  However, calcium was found to be 15 and creatinine 3.53.  He underwent a CT scan of the abdomen and pelvis without contrast that revealed bilateral hydroureteronephrosis down to the level of the bladder.  There was concern for prostate neoplasm with central necrosis and tumor extension into the bladder/rectum and pubic bones.  On my review, I question if this truly represents prostate cancer that is locally advanced versus chronic necrotic tissue and worsening radiation changes.  I would expect his PSA to be elevated if there were truly locally advanced prostate cancer and his PSA on 09/08/2017 was undetectable.  I do not see any evidence of him being on androgen deprivation therapy and the family/patient denies being on this as well.    The patient's daughter provides the majority of the history.  The patient appears confused today.  CT of the head was negative for acute process.  Past Medical History:  Diagnosis Date  . Alcohol abuse   . BPH (benign prostatic hypertrophy) 2000-2001  . Diabetes mellitus without complication ([place])    pt reports that they were checking him during last admission for DM but never started any medicine or made any referrals for F/U  . History of kidney stones   . History of nephrolithiasis   . Hypertension   .  Prostate cancer ([place]) 2005  . Tobacco abuse    Past Surgical History:  Procedure Laterality Date  . APPENDECTOMY    . CYSTOSCOPY N/A 08/05/2016   Procedure: CYSTOSCOPY, URETHROSCOPY;  Surgeon: Raynelle Bring, MD;  Location: [facility];  Service: Urology;  Laterality: N/A;  . CYSTOSCOPY N/A 09/05/2016   Procedure:  CYSTOSCOPY, URETHROSCOPY, BALLOON DILATION OF SUPRAPUBIC TRACK, PLACEMENT OF 14FR SUPRAPUIC CATHETER;  Surgeon: Raynelle Bring, MD;  Location: [facility];  Service: Urology;  Laterality: N/A;  . LITHOTRIPSY    . RADIOACTIVE SEED IMPLANT     for prostate cancer    Home Medications:   (Not in a hospital admission) Allergies: No Known Allergies  Family History  Problem Relation Age of Onset  . Heart disease Mother   . Heart disease Father   . Cancer Cousin    Social History:  reports that he quit smoking about 2 years ago. He has never used smokeless tobacco. He reports that he does not drink alcohol or use drugs.  ROS: A complete review of systems was performed.  All systems are negative except for pertinent findings as noted. ROS   Physical Exam:  Vital signs in last 24 hours: Temp:  [97.7 F (36.5 C)] 97.7 F (36.5 C) (04/16 1052) Pulse Rate:  [72-83] 74 (04/16 1850) Resp:  [16-26] 21 (04/16 2000) BP: (121-154)/(67-79) 154/78 (04/16 2000) SpO2:  [97 %-100 %] 97 % (04/16 2000) Weight:  [49.9 kg (110 lb)] 49.9 kg (110 lb) (04/16 1856) General:  Alert but not oriented, No acute distress HEENT: Normocephalic, atraumatic Cardiovascular: Regular rate and rhythm Lungs: Regular rate and effort Abdomen: Soft, nontender, nondistended, no abdominal masses.  His colostomy is pink patent and productive.  However, there is a clear yellowish fluid in the colostomy bag. Genitourinary: Suprapubic catheter was in place and was capped.  Uncapped it and placed it to bag gravity drainage.  The urine was thick and cloudy and flushed with normal saline until clear Back: No CVA tenderness Extremities: No edema Neurologic: Grossly intact  Laboratory Data:  Results for orders placed or performed during the hospital encounter of 09/30/17 (from the past 24 hour(s))  CBC     Status: Abnormal   Collection Time: 09/30/17 12:18 PM  Result Value Ref Range   WBC 10.2 4.0 - 10.5 K/uL   RBC 4.16 (L) 4.22 -  5.81 MIL/uL   Hemoglobin 12.2 (L) 13.0 - 17.0 g/dL   HCT 37.2 (L) 39.0 - 52.0 %   MCV 89.4 78.0 - 100.0 fL   MCH 29.3 26.0 - 34.0 pg   MCHC 32.8 30.0 - 36.0 g/dL   RDW 12.6 11.5 - 15.5 %   Platelets 374 150 - 400 K/uL  Comprehensive metabolic panel     Status: Abnormal   Collection Time: 09/30/17 12:18 PM  Result Value Ref Range   Sodium 142 135 - 145 mmol/L   Potassium 3.4 (L) 3.5 - 5.1 mmol/L   Chloride 110 101 - 111 mmol/L   CO2 19 (L) 22 - 32 mmol/L   Glucose, Bld 100 (H) 65 - 99 mg/dL   BUN 73 (H) 6 - 20 mg/dL   Creatinine, Ser 3.53 (H) 0.61 - 1.24 mg/dL   Calcium 15.0 (HH) 8.9 - 10.3 mg/dL   Total Protein 8.4 (H) 6.5 - 8.1 g/dL   Albumin 3.2 (L) 3.5 - 5.0 g/dL   AST 11 (L) 15 - 41 U/L   ALT 10 (L) 17 - 63 U/L   Alkaline  Phosphatase 84 38 - 126 U/L   Total Bilirubin 0.4 0.3 - 1.2 mg/dL   GFR calc non Af Amer 16 (L) >60 mL/min   GFR calc Af Amer 18 (L) >60 mL/min   Anion gap 13 5 - 15  Lipase, blood     Status: None   Collection Time: 09/30/17 12:18 PM  Result Value Ref Range   Lipase 18 11 - 51 U/L  Urinalysis, Routine w reflex microscopic     Status: Abnormal   Collection Time: 09/30/17  3:48 PM  Result Value Ref Range   Color, Urine YELLOW YELLOW   APPearance TURBID (A) CLEAR   Specific Gravity, Urine 1.009 1.005 - 1.030   pH 9.0 (H) 5.0 - 8.0   Glucose, UA NEGATIVE NEGATIVE mg/dL   Hgb urine dipstick SMALL (A) NEGATIVE   Bilirubin Urine NEGATIVE NEGATIVE   Ketones, ur NEGATIVE NEGATIVE mg/dL   Protein, ur 100 (A) NEGATIVE mg/dL   Nitrite NEGATIVE NEGATIVE   Leukocytes, UA LARGE (A) NEGATIVE   RBC / HPF 6-30 0 - 5 RBC/hpf   WBC, UA TOO NUMEROUS TO COUNT 0 - 5 WBC/hpf   Bacteria, UA MANY (A) NONE SEEN   Squamous Epithelial / LPF NONE SEEN NONE SEEN   No results found for this or any previous visit (from the past 240 hour(s)). Creatinine: Recent Labs    09/30/17 1218  CREATININE 3.53*   Procedure: Under sterile conditions, I irrigated his suprapubic  catheter with normal saline.  There was a moderate amount of debris/cloudy thick urine that cleared after flushing with about 200 cc of normal saline.  Impression/Assessment:  1. Prostate cancer-- followed at [facility].  Status post brachytherapy in 2005 for clinical stage T1c Gleason 3+3.  CT scan here showed concern for locally advanced prostate cancer.  However I question if this is truly the case given his undetectable PSA.  I will recheck a PSA with morning labs.  I question if the changes in the pelvis are secondary to radiation changes as well as his rectourethral fistula and chronic inflammatory process.  May be beneficial to consult with interventional radiology to see if there is any possibility of biopsy to rule in or out locally advanced prostate cancer.  2.  Acute renal insufficiency with bilateral hydroureteronephrosis likely secondary to ureteral obstruction--?  Malignancy associated versus obstruction secondary to radiation--recommend bilateral nephrostomy tube placement for decompression.  He has a history of obliterated urethra therefore unable to place ureteral stents.  Continue suprapubic catheter and can flush as needed if there is decreased urine output.  3.  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  Continue suprapubic catheter.  Bilateral nephrostomy tubes will also divert the urine from this area.  4.  Possible urinary tract infection.  Urine was thick and cloudy today.  Agree with urine culture and treat with antibiotics.  5.  Possible new fistula with colon as evidenced by clear yellow drainage in the colostomy bag.  I will send this for a creatinine level to determine if this is truly urine.  Treatment would be diversion with nephrostomy tubes which is already planned.  Ricardo Johnson, III 09/30/2017, 8:47 PM

## 2017-09-30 NOTE — ED Notes (Signed)
Date and time results received: 09/30/17 15:44 (use smartphrase ".now" to insert current time)  Test: Calcium  Critical Value: 15.0  Name of Provider Notified: Notified Dr. Marylene Buerger via telephone.   Orders Received? Or Actions Taken?: Will continue to monitor and await new orders.

## 2017-09-30 NOTE — ED Notes (Signed)
Attempted to call report, nurse is unavailable at this time

## 2017-09-30 NOTE — ED Notes (Signed)
Per daughter, patient has not had food in 3 weeks. Patient has had small sips of water and soda.

## 2017-10-01 ENCOUNTER — Inpatient Hospital Stay (HOSPITAL_COMMUNITY): Payer: Medicare HMO

## 2017-10-01 ENCOUNTER — Encounter (HOSPITAL_COMMUNITY): Payer: Self-pay | Admitting: Interventional Radiology

## 2017-10-01 ENCOUNTER — Ambulatory Visit
Admit: 2017-10-01 | Discharge: 2017-10-01 | Disposition: A | Discharge status: Home / Self Care | Payer: Medicare HMO | Attending: Radiation Oncology | Admitting: Radiation Oncology

## 2017-10-01 DIAGNOSIS — Z515 Encounter for palliative care: Secondary | ICD-10-CM

## 2017-10-01 DIAGNOSIS — N183 Chronic kidney disease, stage 3 (moderate): Secondary | ICD-10-CM | POA: Diagnosis not present

## 2017-10-01 DIAGNOSIS — C61 Malignant neoplasm of prostate: Secondary | ICD-10-CM

## 2017-10-01 DIAGNOSIS — E43 Unspecified severe protein-calorie malnutrition: Secondary | ICD-10-CM

## 2017-10-01 DIAGNOSIS — Z7189 Other specified counseling: Secondary | ICD-10-CM

## 2017-10-01 DIAGNOSIS — N400 Enlarged prostate without lower urinary tract symptoms: Secondary | ICD-10-CM | POA: Diagnosis not present

## 2017-10-01 HISTORY — PX: IR NEPHROSTOMY PLACEMENT LEFT: IMG6063

## 2017-10-01 HISTORY — PX: IR NEPHROSTOMY PLACEMENT RIGHT: IMG6064

## 2017-10-01 LAB — COMPREHENSIVE METABOLIC PANEL
ALBUMIN: 2.3 g/dL — AB (ref 3.5–5.0)
ALT: 8 U/L — AB (ref 17–63)
AST: 11 U/L — ABNORMAL LOW (ref 15–41)
Alkaline Phosphatase: 60 U/L (ref 38–126)
Anion gap: 13 (ref 5–15)
BILIRUBIN TOTAL: 0.5 mg/dL (ref 0.3–1.2)
BUN: 62 mg/dL — ABNORMAL HIGH (ref 6–20)
CO2: 13 mmol/L — AB (ref 22–32)
CREATININE: 3.26 mg/dL — AB (ref 0.61–1.24)
Calcium: 11.9 mg/dL — ABNORMAL HIGH (ref 8.9–10.3)
Chloride: 117 mmol/L — ABNORMAL HIGH (ref 101–111)
GFR calc Af Amer: 20 mL/min — ABNORMAL LOW (ref >60)
GFR calc non Af Amer: 17 mL/min — ABNORMAL LOW (ref >60)
GLUCOSE: 132 mg/dL — AB (ref 65–99)
Potassium: 3.7 mmol/L (ref 3.5–5.1)
SODIUM: 143 mmol/L (ref 135–145)
TOTAL PROTEIN: 6.3 g/dL — AB (ref 6.5–8.1)

## 2017-10-01 LAB — CREATININE, FLUID (PLEURAL, PERITONEAL, JP DRAINAGE): Creat, Fluid: 0.7 mg/dL

## 2017-10-01 LAB — CBC
HEMATOCRIT: 29.8 % — AB (ref 39.0–52.0)
HEMOGLOBIN: 9.7 g/dL — AB (ref 13.0–17.0)
MCH: 29 pg (ref 26.0–34.0)
MCHC: 32.6 g/dL (ref 30.0–36.0)
MCV: 89.2 fL (ref 78.0–100.0)
Platelets: 325 10*3/uL (ref 150–400)
RBC: 3.34 MIL/uL — ABNORMAL LOW (ref 4.22–5.81)
RDW: 12.9 % (ref 11.5–15.5)
WBC: 9.9 10*3/uL (ref 4.0–10.5)

## 2017-10-01 LAB — PSA

## 2017-10-01 LAB — PROTIME-INR
INR: 1.23
Prothrombin Time: 15.4 seconds — ABNORMAL HIGH (ref 11.4–15.2)

## 2017-10-01 MED ORDER — HEPARIN SODIUM (PORCINE) 5000 UNIT/ML IJ SOLN
5000.0000 [IU] | Freq: Three times a day (TID) | INTRAMUSCULAR | Status: DC
  Administered 2017-10-01 – 2017-10-02 (×3): 5000 [IU] via SUBCUTANEOUS
  Filled 2017-10-01 (×3): qty 1

## 2017-10-01 MED ORDER — MEGESTROL ACETATE 400 MG/10ML PO SUSP
400.0000 mg | Freq: Every day | ORAL | Status: DC
  Administered 2017-10-01 – 2017-10-02 (×2): 400 mg via ORAL
  Filled 2017-10-01 (×7): qty 10

## 2017-10-01 MED ORDER — MIDAZOLAM HCL 2 MG/2ML IJ SOLN
INTRAMUSCULAR | Status: AC
Start: 2017-10-01 — End: 2017-10-02
  Filled 2017-10-01: qty 4

## 2017-10-01 MED ORDER — LIP MEDEX EX OINT: TOPICAL_OINTMENT | CUTANEOUS | Status: DC | PRN

## 2017-10-01 MED ORDER — LIDOCAINE HCL 1 % IJ SOLN
INTRAMUSCULAR | Status: AC
  Filled 2017-10-01: qty 20

## 2017-10-01 MED ORDER — FENTANYL CITRATE (PF) 100 MCG/2ML IJ SOLN
INTRAMUSCULAR | Status: AC
  Filled 2017-10-01: qty 4

## 2017-10-01 MED ORDER — MIDAZOLAM HCL 2 MG/2ML IJ SOLN
INTRAMUSCULAR | Status: AC | PRN
  Administered 2017-10-01: 1 mg via INTRAVENOUS

## 2017-10-01 MED ORDER — FENTANYL CITRATE (PF) 100 MCG/2ML IJ SOLN
INTRAMUSCULAR | Status: AC | PRN
  Administered 2017-10-01: 25 ug via INTRAVENOUS
  Administered 2017-10-01: 50 ug via INTRAVENOUS

## 2017-10-01 MED ORDER — SODIUM CHLORIDE 0.9% FLUSH
5.0000 mL | Freq: Three times a day (TID) | INTRAVENOUS | Status: DC
  Administered 2017-10-01 – 2017-10-08 (×17): 5 mL

## 2017-10-01 MED ORDER — IOPAMIDOL (ISOVUE-300) INJECTION 61%
INTRAVENOUS | Status: AC
  Administered 2017-10-01: 20 mL
  Filled 2017-10-01: qty 50

## 2017-10-01 MED ORDER — IOPAMIDOL (ISOVUE-300) INJECTION 61%
50.0000 mL | Freq: Once | INTRAVENOUS | Status: AC | PRN
  Administered 2017-10-01: 20 mL

## 2017-10-01 NOTE — Progress Notes (Signed)
Initial Nutrition Assessment  DOCUMENTATION CODES:   Severe malnutrition in context of acute illness/injury, Underweight  INTERVENTION:   -Provide Ensure Enlive po BID, each supplement provides 350 kcal and 20 grams of protein -Provide Multivitamin with minerals daily  NUTRITION DIAGNOSIS:   Severe Malnutrition related to acute illness, lethargy/confusion([facility]) as evidenced by energy intake < 75% for > 7 days, moderate fat depletion, moderate muscle depletion.  GOAL:   Patient will meet greater than or equal to 90% of their needs  MONITOR:   PO intake, Supplement acceptance, Labs, Weight trends, I & O's  REASON FOR ASSESSMENT:   Consult Assessment of nutrition requirement/status  ASSESSMENT:   72 y.o. male with past medical history relevant for hypertension, metastatic prostate cancer diagnosed almost 20 years ago, hypercalcemia which is worsened as well as COPD and history of alcohol abuse previously who presents from home where he lives independently with concerns about confusion, lethargy, disorientation.   Pt confused and unable to provide much history at bedside. Family not present. Pt reports feeling "sick to my stomach" and "too drowsy to eat lately". Per chart review, pt's daughter had reported pt has had little intake for 3 weeks now which was consistent with report from [place]. Pt has not eaten any breakfast this morning yet. States he likes Ensures and prefers [employer].   Per chart review, pt has lost 33 lb since March 2018 (23% wt loss x 13 months, significant for time frame).   Medications: Vitamin D tablet daily, Folic acid tablet daily, Multivitamin with minerals daily, K-DUR tablet once, Senokot tablet BID, Thiamine tablet daily Labs reviewed: GFR: 20   NUTRITION - FOCUSED PHYSICAL EXAM:    Most Recent Value  Orbital Region  Mild depletion  Upper Arm Region  Moderate depletion  Thoracic and Lumbar Region  Unable to assess  Buccal Region   Mild depletion  Temple Region  Moderate depletion  Clavicle Bone Region  Moderate depletion  Clavicle and Acromion Bone Region  Moderate depletion  Scapular Bone Region  Moderate depletion  Dorsal Hand  Mild depletion  Patellar Region  Unable to assess  Anterior Thigh Region  Unable to assess  Posterior Calf Region  Unable to assess  Edema (RD Assessment)  None       Diet Order:  Diet regular Room service appropriate? Yes; Fluid consistency: Thin  EDUCATION NEEDS:   Not appropriate for education at this time  Skin:  Skin Assessment: Reviewed RN Assessment  Last BM:  4/17  Height:   Ht Readings from Last 1 Encounters:  09/30/17 5\' 6"  (1.676 m)    Weight:   Wt Readings from Last 1 Encounters:  09/30/17 110 lb (49.9 kg)    Ideal Body Weight:  64.5 kg  BMI:  Body mass index is 17.75 kg/m.  Estimated Nutritional Needs:   Kcal:  3007-6226  Protein:  75-85g  Fluid:  1.9L/day   [name] Bibles, MS, RD, LDN [place] [facility] Pager: [phone] After Hours Pager: [phone]

## 2017-10-01 NOTE — Progress Notes (Signed)
MEDICATION-RELATED CONSULT NOTE   IR Procedure Consult - Anticoagulant/Antiplatelet PTA/Inpatient Med List Review by Pharmacist    Procedure: L nephrostomy tube placement    Completed: 4/17 at ~5pm  Post-Procedural bleeding risk per IR MD assessment:  STANDARD  Antithrombotic medications on inpatient or PTA profile prior to procedure:   SQ heparin     Recommended restart time per IR Post-Procedure Guidelines:  Day 0; at least 6 hrs or next standard dosing interval, whichever is later   Other considerations:   none   Plan:    Resume SQ heparin 5000 units q8 hr at [place], PharmD, BCPS [phone] 10/01/2017, 6:10 PM
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
## 2017-10-01 NOTE — Progress Notes (Signed)
   10/01/17 1436  Clinical Encounter Type  Visited With Patient and family together  Visit Type Initial  Referral From Nurse;Palliative care team  Consult/Referral To Chaplain   Responding to a SCC from the family.  Upon entering there were roughly 12 family members present.  They indicated patient was going for a procedure today.  Patient almost seemed as if he wishes they were not all there at the same time.  Patient seemed a little anxious about the procedure, but did not say much.  The family appreciated the visit, will make sure a follow up happens tomorrow, hopefully not as many folks present.  Will follow and support as needed. Chaplain Katherene Ponto

## 2017-10-01 NOTE — Sedation Documentation (Signed)
L PCN inserted at this time. No s/s of pain/discomfort noted

## 2017-10-01 NOTE — Procedures (Signed)
Interventional Radiology Procedure Note  Procedure: Bilateral 53M PCNs  Complications: None  Estimated Blood Loss: None  Recommendations: - Follow Cr - Watch for signs of bleeding   Signed,  Criselda Peaches, MD

## 2017-10-01 NOTE — Consult Note (Signed)
Consultation Note Date: 10/01/2017   Patient Name: Ricardo Johnson  DOB: 1945/07/25  MRN: [id number]  Age / Sex: 72 y.o., male  PCP: [name], MD Referring Physician: Roxan Hockey, MD  Reason for Consultation: Establishing goals of care  HPI/Patient Profile: 72 y.o. male admitted on 09/30/2017    Clinical Assessment and Goals of Care:  72 year old gentleman with diabetes nephrolithiasis hypertension history of tobacco, alcohol use, history of 4 state cancer status post brachytherapy in 2005, treatment at [facility], known history of rectovesical fistula of the bladder neck, status post diverting colostomy has been admitted to [facility] with confusion and weakness elevated creatinine, imaging showing prostate neoplasm with central necrosis, bilateral hydronephrosis. Patient has been seen and evaluated by interventional radiology, urology.  A palliative consultation has also been requested for goals of care discussions.  Elderly appearing gentleman resting in bed. His 3 daughters are present at the bedside. I introduce myself and palliative care as follows: Palliative medicine is specialized medical care for people living with serious illness. It focuses on providing relief from the symptoms and stress of a serious illness. The goal is to improve quality of life for both the patient and the family.  Patient himself does not verbalize much, words I contacted. He denies being in pain currently. Daughters present at the bedside state that patient is to go for a procedure soon. We discussed about possibility of the patient undergoing bilateral percutaneous nephrostomy tube placements. Discussed about role of supportive/palliative services in the hospital. See additional discussions/recommendations below. Thank you for the consult.  HCPOA  daughter Bartolo Montanye is primary caregiver , patient has 3  daughters and one son, daughters live locally and son lives out of town.    SUMMARY OF RECOMMENDATIONS    Full code/full scope Patient to go to IR today for bilateral percutaneous nephrostomy tubes.  Family awaits further input from oncology PMT will follow peripherally Thank you for the consult.   Code Status/Advance Care Planning:  Full code    Symptom Management:    as above   Palliative Prophylaxis:   Bowel Regimen  Additional Recommendations (Limitations, Scope, Preferences):  Full Scope Treatment  Psycho-social/Spiritual:   Desire for further Chaplaincy support:yes  Additional Recommendations: Caregiving  Support/Resources  Prognosis:   Unable to determine  Discharge Planning: To Be Determined      Primary Diagnoses: Present on Admission: . Hypercalcemia . ADENOCARCINOMA, PROSTATE with Liver Mets . Essential hypertension . AKI (acute kidney injury) ([place]) . Anemia . Acute bilateral obstructive uropathy/Bil hydroureter and Bil hydronephrosis   I have reviewed the medical record, interviewed the patient and family, and examined the patient. The following aspects are pertinent.  Past Medical History:  Diagnosis Date  . Alcohol abuse   . BPH (benign prostatic hypertrophy) 2000-2001  . Diabetes mellitus without complication ([place])    pt reports that they were checking him during last admission for DM but never started any medicine or made any referrals for F/U  . History of kidney stones   .  History of nephrolithiasis   . Hypertension   . Prostate cancer ([place]) 2005  . Tobacco abuse    Social History   Socioeconomic History  . Marital status: Widowed    Spouse name: Not on file  . Number of children: Not on file  . Years of education: Not on file  . Highest education level: Not on file  Occupational History  . Not on file  Social Needs  . Financial resource strain: Not on file  . Food insecurity:    Worry: Not on file    Inability: Not on  file  . Transportation needs:    Medical: Not on file    Non-medical: Not on file  Tobacco Use  . Smoking status: Former Smoker    Last attempt to quit: 01/30/2015    Years since quitting: 2.6  . Smokeless tobacco: Never Used  Substance and Sexual Activity  . Alcohol use: No    Alcohol/week: 1.2 oz    Types: 1 Cans of beer, 1 Shots of liquor per week    Comment: "not since i got sick "  . Drug use: No  . Sexual activity: Not Currently  Lifestyle  . Physical activity:    Days per week: Not on file    Minutes per session: Not on file  . Stress: Not on file  Relationships  . Social connections:    Talks on phone: Not on file    Gets together: Not on file    Attends religious service: Not on file    Active member of club or organization: Not on file    Attends meetings of clubs or organizations: Not on file    Relationship status: Not on file  Other Topics Concern  . Not on file  Social History Narrative   Occupation: Cytogeneticist- Geologist, engineering   Married, 5 children   Regular exercise- yes   Family History  Problem Relation Age of Onset  . Heart disease Mother   . Heart disease Father   . Cancer Cousin    Scheduled Meds: . amLODipine  10 mg Oral Daily  . calcitonin  4 Units/kg Subcutaneous BID  . cholecalciferol  2,000 Units Oral Daily  . feeding supplement (ENSURE ENLIVE)  237 mL Oral BID  . finasteride  5 mg Oral Daily  . folic acid  1 mg Oral Daily  . levothyroxine  100 mcg Oral QAC breakfast  . metoprolol succinate  100 mg Oral Daily  . multivitamin with minerals  1 tablet Oral Daily  . potassium chloride  40 mEq Oral Once  . senna  1 tablet Oral BID  . sodium chloride flush  3 mL Intravenous Q12H  . tamsulosin  0.4 mg Oral Daily  . thiamine  100 mg Oral Daily   Continuous Infusions: . sodium chloride 150 mL/hr at 10/01/17 0802  . sodium chloride    . cefTRIAXone (ROCEPHIN)  IV Stopped (09/30/17 2254)   PRN Meds:.sodium chloride, acetaminophen **OR**  acetaminophen, albuterol, hydrALAZINE, morphine injection, ondansetron **OR** ondansetron (ZOFRAN) IV, oxyCODONE, polyethylene glycol, sodium chloride flush, traZODone Medications Prior to Admission:  Prior to Admission medications   Medication Sig Start Date End Date Taking? Authorizing Provider  amLODipine (NORVASC) 10 MG tablet Take 10 mg by mouth daily.  08/25/15   [provider]  Cholecalciferol (VITAMIN D) 2000 units CAPS Take 2,000 Units by mouth daily.     [provider]  finasteride (PROSCAR) 5 MG tablet TAKE 1 TABLET BY MOUTH  EVERY DAY Patient taking differently: TAKE 1 TABLET (5mg ) BY MOUTH EVERY DAY 01/04/11   Kalia-Reynolds, Maitri S, DO  folic acid (FOLVITE) 1 MG tablet Take 1 tablet (1 mg total) by mouth daily. 08/11/16   Modena Jansky, MD  levothyroxine (SYNTHROID, LEVOTHROID) 100 MCG tablet Take 100 mcg by mouth daily before breakfast. 07/16/16   [provider]  lisinopril-hydrochlorothiazide (PRINZIDE,ZESTORETIC) 20-25 MG tablet Take 1 tablet by mouth daily. 04/22/16   [provider]  metoprolol succinate (TOPROL-XL) 100 MG 24 hr tablet Take 100 mg by mouth daily. 08/25/15   [provider]  Multiple Vitamin (MULTIVITAMIN WITH MINERALS) TABS tablet Take 1 tablet by mouth daily. 08/11/16   Hongalgi, Lenis Dickinson, MD  naproxen sodium (ALEVE) 220 MG tablet Take 220 mg by mouth 2 (two) times daily as needed (pain).    [provider]  oxyCODONE (OXY IR/ROXICODONE) 5 MG immediate release tablet Take 5 mg by mouth every 4 (four) hours. 09/17/17   [provider]  Tamsulosin HCl (FLOMAX) 0.4 MG CAPS TAKE ONE CAPSULE BY MOUTH EVERY DAY Patient taking differently: TAKE ONE CAPSULE (0.4mg ) BY MOUTH EVERY DAY 11/27/10   Kalia-Reynolds, Maitri S, DO  thiamine 100 MG tablet Take 1 tablet (100 mg total) by mouth daily. 08/11/16   Modena Jansky, MD   No Known Allergies Review of Systems +weakness  Physical Exam Awake alert resting  in bed Appears weak, appears chronically ill Regular Abdomen soft Trace edema Avoids eye contact, doesn't verbalize much  Vital Signs: BP (!) 142/74   Pulse 79   Temp 98 F (36.7 C) (Oral)   Resp 16   Ht 5\' 6"  (1.676 m)   Wt 49.9 kg (110 lb)   SpO2 100%   BMI 17.75 kg/m  Pain Scale: 0-10   Pain Score: 0-No pain   SpO2: SpO2: 100 % O2 Device:SpO2: 100 % O2 Flow Rate: .   IO: Intake/output summary:   Intake/Output Summary (Last 24 hours) at 10/01/2017 1411 Last data filed at 10/01/2017 1323 Gross per 24 hour  Intake 2507.5 ml  Output 1700 ml  Net 807.5 ml    LBM: Last BM Date: 10/01/17 Baseline Weight: Weight: 49.9 kg (110 lb) Most recent weight: Weight: 49.9 kg (110 lb)     Palliative Assessment/Data:   PPS 30%  Time In:  11 Time Out:  12 Time Total:  60 min  Greater than 50%  of this time was spent counseling and coordinating care related to the above assessment and plan.  Signed by: Loistine Chance, MD [phone]   Please contact Palliative Medicine Team phone at [phone] for questions and concerns.  For individual provider: See Shea Evans

## 2017-10-01 NOTE — Sedation Documentation (Signed)
R PCN placed at this time. No s/s of pain/discomfort noted

## 2017-10-01 NOTE — Progress Notes (Signed)
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 PLACEMENT RIGHT; IR NEPHROSTOMY PLACEMENT LEFT COMPARISON:  CT abdomen/pelvis 09/30/2017 MEDICATIONS: 1 g Rocephin; The antibiotic was administered in an appropriate time frame prior to skin puncture. ANESTHESIA/SEDATION: Fentanyl 1 mcg IV; Versed 75 mg IV Moderate Sedation Time:  23 minutes The patient was continuously monitored during the procedure by the interventional radiology nurse under my direct supervision. CONTRAST:  13mL ISOVUE-300 IOPAMIDOL (ISOVUE-300) INJECTION 61%, 61mL ISOVUE-300 IOPAMIDOL (ISOVUE-300) INJECTION 61% - administered into the collecting system(s) FLUOROSCOPY TIME:  Fluoroscopy Time: 2 minutes 24 seconds (11 mGy). COMPLICATIONS: None immediate. TECHNIQUE: The procedure, risks, benefits, and alternatives were explained to the patient. Questions regarding the procedure were encouraged and answered. The patient understands and consents to the procedure. LEFT PCN The left flank was prepped with chlorhexidine in a sterile fashion, and a sterile drape was applied covering the operative field. A sterile gown and sterile gloves were used for the procedure.  Local anesthesia was provided with 1% Lidocaine. The left flank was interrogated with ultrasound and the left kidney identified. The kidney is hydronephrotic. A suitable access site on the skin overlying the lower pole, posterior calix was identified. After local mg anesthesia was achieved, a small skin nick was made with an 11 blade scalpel. A 21 gauge Accustick needle was then advanced under direct sonographic guidance into the lower pole of the left kidney. A 0.018 inch wire was advanced under fluoroscopic guidance into the left renal collecting system. The Accustick sheath was then advanced over the wire and a 0.018 system exchanged for a 0.035 system. Gentle hand injection of contrast material confirms placement of the sheath within the renal collecting system. There is hydronephrosis. The tract from the scan into the renal collecting system was then dilated serially to 10-French. A 10-French Cook all-purpose drain was then placed and positioned under fluoroscopic guidance. The locking loop is well formed within the left renal pelvis. The catheter was secured to the skin with 2-0 Prolene and a sterile bandage was placed. Catheter was left to gravity bag drainage. RIGHT PCN The right flank was prepped with chlorhexidine in a sterile fashion, and a sterile drape was applied covering the operative field. A sterile gown and sterile gloves were used for the procedure. Local anesthesia was provided with 1% Lidocaine. The right flank was interrogated with ultrasound and the left kidney identified. The kidney is hydronephrotic. A suitable access site on the skin overlying the lower pole, posterior calix was identified. After local mg anesthesia was achieved, a small skin nick was made with an 11 blade scalpel. A 21 gauge Accustick needle was then advanced under direct sonographic guidance into the lower pole of the right kidney. A 0.018 inch wire was advanced under fluoroscopic guidance into the left renal collecting  system. The  Accustick sheath was then advanced over the wire and a 0.018 system exchanged for a 0.035 system. Gentle hand injection of contrast material confirms placement of the sheath within the renal collecting system. There is marked hydronephrosis. The tract from the scan into the renal collecting system was then dilated serially to 10-French. A 10-French Cook all-purpose drain was then placed and positioned under fluoroscopic guidance. The locking loop is well formed within the left renal pelvis. The catheter was secured to the skin with 2-0 Prolene and a sterile bandage was placed. Catheter was left to gravity bag drainage. IMPRESSION: Successful placement of a bilateral 10 French percutaneous nephrostomy tubes. Signed, Criselda Peaches, MD [facility] Radiology Electronically Signed   By: Jacqulynn Cadet M.D.   On: 10/01/2017 17:03     CBC Recent Labs  Lab 09/30/17 1218 10/01/17 0520  WBC 10.2 9.9  HGB 12.2* 9.7*  HCT 37.2* 29.8*  PLT 374 325  MCV 89.4 89.2  MCH 29.3 29.0  MCHC 32.8 32.6  RDW 12.6 12.9    Chemistries  Recent Labs  Lab 09/30/17 1218 10/01/17 0520  NA 142 143  K 3.4* 3.7  CL 110 117*  CO2 19* 13*  GLUCOSE 100* 132*  BUN 73* 62*  CREATININE 3.53* 3.26*  CALCIUM 15.0* 11.9*  AST 11* 11*  ALT 10* 8*  ALKPHOS 84 60  BILITOT 0.4 0.5   ------------------------------------------------------------------------------------------------------------------ No results for input(s): CHOL, HDL, LDLCALC, TRIG, CHOLHDL, LDLDIRECT in the last 72 hours.  Lab Results  Component Value Date   HGBA1C 6.5 (H) 08/08/2016   ------------------------------------------------------------------------------------------------------------------ No results for input(s): TSH, T4TOTAL, T3FREE, THYROIDAB in the last 72 hours.  Invalid input(s):  FREET3 ------------------------------------------------------------------------------------------------------------------ No results for input(s): VITAMINB12, FOLATE, FERRITIN, TIBC, IRON, RETICCTPCT in the last 72 hours.  Coagulation profile Recent Labs  Lab 10/01/17 0934  INR 1.23    No results for input(s): DDIMER in the last 72 hours.  Cardiac Enzymes No results for input(s): CKMB, TROPONINI, MYOGLOBIN in the last 168 hours.  Invalid input(s): CK ------------------------------------------------------------------------------------------------------------------    Component Value Date/Time   BNP 61.9 01/25/2015 1308     Charidy Cappelletti M.D on 10/01/2017 at 5:08 PM  Between 7am to 7pm - Pager - [phone]  After 7pm go to [email or web address] - password [email or web address]  [facility] -  Office  [phone]   Voice Recognition Viviann Spare dictation system was used to create this note, attempts have been made to correct errors. Please contact the author with questions and/or clarifications.

## 2017-10-02 DIAGNOSIS — J449 Chronic obstructive pulmonary disease, unspecified: Secondary | ICD-10-CM

## 2017-10-02 DIAGNOSIS — D649 Anemia, unspecified: Secondary | ICD-10-CM

## 2017-10-02 DIAGNOSIS — B965 Pseudomonas (aeruginosa) (mallei) (pseudomallei) as the cause of diseases classified elsewhere: Secondary | ICD-10-CM

## 2017-10-02 DIAGNOSIS — C787 Secondary malignant neoplasm of liver and intrahepatic bile duct: Secondary | ICD-10-CM

## 2017-10-02 DIAGNOSIS — I10 Essential (primary) hypertension: Secondary | ICD-10-CM

## 2017-10-02 DIAGNOSIS — N390 Urinary tract infection, site not specified: Secondary | ICD-10-CM

## 2017-10-02 LAB — CBC WITH DIFFERENTIAL/PLATELET
BASOS PCT: 0 %
Basophils Absolute: 0 10*3/uL (ref 0.0–0.1)
Eosinophils Absolute: 0.2 10*3/uL (ref 0.0–0.7)
Eosinophils Relative: 2 %
HCT: 29.9 % — ABNORMAL LOW (ref 39.0–52.0)
HEMOGLOBIN: 9.6 g/dL — AB (ref 13.0–17.0)
Lymphocytes Relative: 14 %
Lymphs Abs: 1.4 10*3/uL (ref 0.7–4.0)
MCH: 29.1 pg (ref 26.0–34.0)
MCHC: 32.1 g/dL (ref 30.0–36.0)
MCV: 90.6 fL (ref 78.0–100.0)
MONO ABS: 0.6 10*3/uL (ref 0.1–1.0)
MONOS PCT: 5 %
NEUTROS PCT: 79 %
Neutro Abs: 8.3 10*3/uL — ABNORMAL HIGH (ref 1.7–7.7)
Platelets: 308 10*3/uL (ref 150–400)
RBC: 3.3 MIL/uL — ABNORMAL LOW (ref 4.22–5.81)
RDW: 13 % (ref 11.5–15.5)
WBC: 10.5 10*3/uL (ref 4.0–10.5)

## 2017-10-02 LAB — BASIC METABOLIC PANEL
Anion gap: 8 (ref 5–15)
BUN: 32 mg/dL — ABNORMAL HIGH (ref 6–20)
CALCIUM: 10.2 mg/dL (ref 8.9–10.3)
CO2: 17 mmol/L — ABNORMAL LOW (ref 22–32)
CREATININE: 1.6 mg/dL — AB (ref 0.61–1.24)
Chloride: 123 mmol/L — ABNORMAL HIGH (ref 101–111)
GFR calc non Af Amer: 41 mL/min — ABNORMAL LOW (ref >60)
GFR, EST AFRICAN AMERICAN: 48 mL/min — AB (ref >60)
Glucose, Bld: 135 mg/dL — ABNORMAL HIGH (ref 65–99)
Potassium: 3.2 mmol/L — ABNORMAL LOW (ref 3.5–5.1)
SODIUM: 148 mmol/L — AB (ref 135–145)

## 2017-10-02 LAB — MAGNESIUM: MAGNESIUM: 1.5 mg/dL — AB (ref 1.7–2.4)

## 2017-10-02 LAB — ALBUMIN: ALBUMIN: 2.4 g/dL — AB (ref 3.5–5.0)

## 2017-10-02 LAB — GLUCOSE, CAPILLARY: GLUCOSE-CAPILLARY: 126 mg/dL — AB (ref 65–99)

## 2017-10-02 MED ORDER — INSULIN ASPART 100 UNIT/ML ~~LOC~~ SOLN
0.0000 [IU] | Freq: Three times a day (TID) | SUBCUTANEOUS | Status: DC
  Administered 2017-10-03: 2 [IU] via SUBCUTANEOUS
  Administered 2017-10-03: 1 [IU] via SUBCUTANEOUS
  Administered 2017-10-04: 2 [IU] via SUBCUTANEOUS
  Administered 2017-10-05: 1 [IU] via SUBCUTANEOUS

## 2017-10-02 MED ORDER — SODIUM CHLORIDE 0.9 % IV SOLN
1.0000 g | INTRAVENOUS | Status: DC
  Administered 2017-10-02: 1 g via INTRAVENOUS
  Filled 2017-10-02 (×2): qty 1

## 2017-10-02 MED ORDER — DEXTROSE-NACL 5-0.45 % IV SOLN
INTRAVENOUS | Status: DC
  Administered 2017-10-02 – 2017-10-04 (×2): via INTRAVENOUS

## 2017-10-02 MED ORDER — INSULIN ASPART 100 UNIT/ML ~~LOC~~ SOLN: 0.0000 [IU] | Freq: Every day | SUBCUTANEOUS | Status: DC

## 2017-10-02 MED ORDER — MAGNESIUM SULFATE 2 GM/50ML IV SOLN
2.0000 g | Freq: Once | INTRAVENOUS | Status: AC
  Administered 2017-10-02: 2 g via INTRAVENOUS
  Filled 2017-10-02: qty 50

## 2017-10-02 MED ORDER — POTASSIUM CHLORIDE CRYS ER 10 MEQ PO TBCR
40.0000 meq | EXTENDED_RELEASE_TABLET | Freq: Once | ORAL | Status: AC
  Administered 2017-10-02: 40 meq via ORAL
  Filled 2017-10-02: qty 4
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
## 2017-10-02 NOTE — Progress Notes (Signed)
PROGRESS NOTE    Ricardo Johnson  NFA:[id number] DOB: 05/19/1946 DOA: 09/30/2017 PCP: [name], MD   Brief Narrative:  72 y.o.malewith past medical history relevant for hypertension,prior h/o COPD, tobacco none alcohol abuse previously, h/o  nephrolithiasis, prostate cancer diagnosed almost 20 years ago status post brachytherapy in 2005. 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 He is status post colostomy in October 2018 , admitted 09/30/17 from home where he lives independently with  hypercalcemia associated with disorientation, lethargy and confusion.   Assessment & Plan:   Principal Problem:   Hypercalcemia Active Problems:   ADENOCARCINOMA, PROSTATE with Liver Mets   Essential hypertension   AKI (acute kidney injury) ([place])   Anemia   Suprapubic catheter in place   Acute bilateral obstructive uropathy/Bil hydroureter and Bil hydronephrosis   Encounter for palliative care   Goals of care, counseling/discussion   Protein-calorie malnutrition, severe   # AKI- 2/2 obstruction and dehydration (also ACEi, naproxen, and HCTZ pta).  creatinine on presentation was 3.53.  Baseline creatinine 1.0.Improving s/p perc nephrostomy tube placement.  Hold lisinopril, HCTZ, naproxen Continue IVF S/p bilateral perc nephrostomy tube placement on 4/17    # Hypercalcemia -  Some persistent confusion.  Calcium has improved.  Corrects to 11.5 today.  Calcium of 15 on presentation (corrected to 15.6).  Calcium 3 weeks ago was 11.8.  Likely 2/2 metastatic cancer.  Pamindronate Calcitonin  Continue IVF  # Hypernatremia: mild, switch to d5 1/2 NS.   # Hypokalemia  hypomagnesemia: follow, replete  # Metastatic Cancer:  Hx of prostate cancer stage T1c gleason 3+3 s/p brachytherapy in 2005.  Concern for prostate cancer on imaging, but normal PSA.  Pathology from recent liver biopsy on 3/28 (care  everywhere) suspicious for "possible metastatic prostatic adenocarcinoma, but not definitive."  Per oncology, concerning for undifferentiated prostate cancer vs second primary malignancy.    - oncology c/s, appreciate recs (planning to consider repeat biopsy with IR)  # Obstructive Uropathy with AKI and Bilateral Hydronephrosis and Hydroureter:  S/p perc nephrostomy tubes on 4/17 by IR Seen by [name] on 4/16 who recommended bilateral nephrostomy tube placement for decompression (of note, pt with hx of obliterated urethra and unable to place ureteral stents.  Pt with suprapubic catheter) Pt sees urologist, [name] at [facility], [place]  # Obliterated urethra with rectorethral/rectovesical fisula:  Suprapubic catheter and s/p bowel diversion.  Of note, [name] noted clear yellow drainage in colostomy bag (concerning for possible new fistula, sent creatinine level to determine if urine - treatment would be diversion with nephrostomy tubes which has been done).  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  Dietician c/s.  Poor PO intake over past few weeks.    # Acute on chronic Anemia- hemoglobin is down to 9.6 from 12.2 on admission due to hemodilution, suspect chronic anemia related to underlying malignancy  # HTN-hold lisinopril and HCTZ due to AKIas above #1,continue amlodipine 10 mg daily,metoprolol 100 mg daily  # Pseudomonas UTI- ceftaz, follow sensitivities  DVT prophylaxis: heparin Code Status: full code Family Communication: none at bedside Disposition Plan: pending possible bx, further improvement   Consultants:   Oncology  urology  Procedures:   4/17 bilateral perc nephrostomy tubes  Antimicrobials:  Anti-infectives (From admission, onward)   Start  Dose/Rate Route Frequency Ordered Stop   10/02/17 1200  cefTAZidime  (FORTAZ) 1 g in sodium chloride 0.9 % 100 mL IVPB     1 g 200 mL/hr over 30 Minutes Intravenous Every 24 hours 10/02/17 1107     09/30/17 2000  cefTRIAXone (ROCEPHIN) 1 g in sodium chloride 0.9 % 100 mL IVPB  Status:  Discontinued     1 g 200 mL/hr over 30 Minutes Intravenous Every 24 hours 09/30/17 1914 10/02/17 1051     Subjective: Difficult to communicate with. Spoke softly. Pain at nephrostomy tube sites.  Objective: Vitals:   10/01/17 1652 10/01/17 2006 10/02/17 0519 10/02/17 1419  BP: 138/68 134/76 132/69 133/68  Pulse: 71 61 72 (!) 59  Resp: 16 20 18 18   Temp: 98 F (36.7 C) (!) 97.4 F (36.3 C) 98.5 F (36.9 C) (!) 97.4 F (36.3 C)  TempSrc: Oral  Oral Oral  SpO2: 100% 100% 100% 100%  Weight:      Height:        Intake/Output Summary (Last 24 hours) at 10/02/2017 1928 Last data filed at 10/02/2017 1719 Gross per 24 hour  Intake 3330 ml  Output 4575 ml  Net -1245 ml   Filed Weights   09/30/17 1856  Weight: 49.9 kg (110 lb)    Examination:  General exam: Appears calm and comfortable, thin, chronically ill appearing  Respiratory system: Clear to auscultation. Respiratory effort normal. Cardiovascular system: S1 & S2 heard, RRR. No JVD, murmurs, rubs, gallops or clicks. No pedal edema. Gastrointestinal system: Abdomen is nondistended, soft and nontender. No organomegaly or masses felt. Normal bowel sounds heard.  Bilateral PCN tubes at flank Central nervous system: Alert and oriented. No focal neurological deficits. Extremities: Symmetric 5 x 5 power. Skin: No rashes, lesions or ulcers Psychiatry: Judgement and insight appear normal. Mood & affect appropriate.     Data Reviewed: I have personally reviewed following labs and imaging studies  CBC: Recent Labs  Lab 09/30/17 1218 10/01/17 0520 10/02/17 1028  WBC 10.2 9.9 10.5  NEUTROABS  --   --  8.3*  HGB 12.2* 9.7* 9.6*  HCT 37.2* 29.8* 29.9*  MCV 89.4 89.2 90.6  PLT 374 325 353   Basic Metabolic  Panel: Recent Labs  Lab 09/30/17 1218 10/01/17 0520 10/02/17 1028  NA 142 143 148*  K 3.4* 3.7 3.2*  CL 110 117* 123*  CO2 19* 13* 17*  GLUCOSE 100* 132* 135*  BUN 73* 62* 32*  CREATININE 3.53* 3.26* 1.60*  CALCIUM 15.0* 11.9* 10.2  MG  --   --  1.5*   GFR: Estimated Creatinine Clearance: 29 mL/min (A) (by C-G formula based on SCr of 1.6 mg/dL (H)). Liver Function Tests: Recent Labs  Lab 09/30/17 1218 10/01/17 0520 10/02/17 1028  AST 11* 11*  --   ALT 10* 8*  --   ALKPHOS 84 60  --   BILITOT 0.4 0.5  --   PROT 8.4* 6.3*  --   ALBUMIN 3.2* 2.3* 2.4*   Recent Labs  Lab 09/30/17 1218  LIPASE 18   No results for input(s): AMMONIA in the last 168 hours. Coagulation Profile: Recent Labs  Lab 10/01/17 0934  INR 1.23   Cardiac Enzymes: No results for input(s): CKTOTAL, CKMB, CKMBINDEX, TROPONINI in the last 168 hours. BNP (last 3 results) No results for input(s): PROBNP in the last 8760 hours. HbA1C: No results for input(s): HGBA1C in the last 72 hours. CBG: No results for input(s): GLUCAP in the last  168 hours. Lipid Profile: No results for input(s): CHOL, HDL, LDLCALC, TRIG, CHOLHDL, LDLDIRECT in the last 72 hours. Thyroid Function Tests: No results for input(s): TSH, T4TOTAL, FREET4, T3FREE, THYROIDAB in the last 72 hours. Anemia Panel: No results for input(s): VITAMINB12, FOLATE, FERRITIN, TIBC, IRON, RETICCTPCT in the last 72 hours. Sepsis Labs: No results for input(s): PROCALCITON, LATICACIDVEN in the last 168 hours.  Recent Results (from the past 240 hour(s))  Urine Culture     Status: Abnormal (Preliminary result)   Collection Time: 09/30/17  3:48 PM  Result Value Ref Range Status   Specimen Description   Final    URINE, SUPRAPUBIC Performed at [facility] [address] [place], [place] [address]    Special Requests   Final    Immunocompromised Performed at [facility], [place] [address], [place],  [place] [address]    Culture (A)  Final    >=100,000 COLONIES/mL PSEUDOMONAS AERUGINOSA >=100,000 COLONIES/mL KLEBSIELLA PNEUMONIAE SUSCEPTIBILITIES TO FOLLOW Performed at [facility] [address], [place], [place] [address]    Report Status PENDING  Incomplete         Radiology Studies: Ir Nephrostomy Placement Left  Result Date: 10/01/2017 INDICATION: 72 year old male with bilateral hydronephrosis in the setting of prostate cancer. EXAM: IR NEPHROSTOMY PLACEMENT RIGHT; IR NEPHROSTOMY PLACEMENT LEFT COMPARISON:  CT abdomen/pelvis 09/30/2017 MEDICATIONS: 1 g Rocephin; The antibiotic was administered in an appropriate time frame prior to skin puncture. ANESTHESIA/SEDATION: Fentanyl 1 mcg IV; Versed 75 mg IV Moderate Sedation Time:  23 minutes The patient was continuously monitored during the procedure by the interventional radiology nurse under my direct supervision. CONTRAST:  8mL ISOVUE-300 IOPAMIDOL (ISOVUE-300) INJECTION 61%, 52mL ISOVUE-300 IOPAMIDOL (ISOVUE-300) INJECTION 61% - administered into the collecting system(s) FLUOROSCOPY TIME:  Fluoroscopy Time: 2 minutes 24 seconds (11 mGy). COMPLICATIONS: None immediate. TECHNIQUE: The procedure, risks, benefits, and alternatives were explained to the patient. Questions regarding the procedure were encouraged and answered. The patient understands and consents to the procedure. LEFT PCN The left flank was prepped with chlorhexidine in a sterile fashion, and a sterile drape was applied covering the operative field. A sterile gown and sterile gloves were used for the procedure. Local anesthesia was provided with 1% Lidocaine. The left flank was interrogated with ultrasound and the left kidney identified. The kidney is hydronephrotic. A suitable access site on the skin overlying the lower pole, posterior calix was identified. After local mg anesthesia was achieved, a small skin nick was made with an 11 blade scalpel. A 21 gauge Accustick needle was  then advanced under direct sonographic guidance into the lower pole of the left kidney. A 0.018 inch wire was advanced under fluoroscopic guidance into the left renal collecting system. The Accustick sheath was then advanced over the wire and a 0.018 system exchanged for a 0.035 system. Gentle hand injection of contrast material confirms placement of the sheath within the renal collecting system. There is hydronephrosis. The tract from the scan into the renal collecting system was then dilated serially to 10-French. A 10-French Cook all-purpose drain was then placed and positioned under fluoroscopic guidance. The locking loop is well formed within the left renal pelvis. The catheter was secured to the skin with 2-0 Prolene and a sterile bandage was placed. Catheter was left to gravity bag drainage. RIGHT PCN The right flank was prepped with chlorhexidine in a sterile fashion, and a sterile drape was applied covering the operative field. A sterile gown and sterile  gloves were used for the procedure. Local anesthesia was provided with 1% Lidocaine. The right flank was interrogated with ultrasound and the left kidney identified. The kidney is hydronephrotic. A suitable access site on the skin overlying the lower pole, posterior calix was identified. After local mg anesthesia was achieved, a small skin nick was made with an 11 blade scalpel. A 21 gauge Accustick needle was then advanced under direct sonographic guidance into the lower pole of the right kidney. A 0.018 inch wire was advanced under fluoroscopic guidance into the left renal collecting system. The Accustick sheath was then advanced over the wire and a 0.018 system exchanged for a 0.035 system. Gentle hand injection of contrast material confirms placement of the sheath within the renal collecting system. There is marked hydronephrosis. The tract from the scan into the renal collecting system was then dilated serially to 10-French. A 10-French Cook  all-purpose drain was then placed and positioned under fluoroscopic guidance. The locking loop is well formed within the left renal pelvis. The catheter was secured to the skin with 2-0 Prolene and a sterile bandage was placed. Catheter was left to gravity bag drainage. IMPRESSION: Successful placement of a bilateral 10 French percutaneous nephrostomy tubes. Signed, Criselda Peaches, MD [facility] Radiology Electronically Signed   By: Jacqulynn Cadet M.D.   On: 10/01/2017 17:03   Ir Nephrostomy Placement Right  Result Date: 10/01/2017 INDICATION: 72 year old male with bilateral hydronephrosis in the setting of prostate cancer. EXAM: IR NEPHROSTOMY PLACEMENT RIGHT; IR NEPHROSTOMY PLACEMENT LEFT COMPARISON:  CT abdomen/pelvis 09/30/2017 MEDICATIONS: 1 g Rocephin; The antibiotic was administered in an appropriate time frame prior to skin puncture. ANESTHESIA/SEDATION: Fentanyl 1 mcg IV; Versed 75 mg IV Moderate Sedation Time:  23 minutes The patient was continuously monitored during the procedure by the interventional radiology nurse under my direct supervision. CONTRAST:  5mL ISOVUE-300 IOPAMIDOL (ISOVUE-300) INJECTION 61%, 2mL ISOVUE-300 IOPAMIDOL (ISOVUE-300) INJECTION 61% - administered into the collecting system(s) FLUOROSCOPY TIME:  Fluoroscopy Time: 2 minutes 24 seconds (11 mGy). COMPLICATIONS: None immediate. TECHNIQUE: The procedure, risks, benefits, and alternatives were explained to the patient. Questions regarding the procedure were encouraged and answered. The patient understands and consents to the procedure. LEFT PCN The left flank was prepped with chlorhexidine in a sterile fashion, and a sterile drape was applied covering the operative field. A sterile gown and sterile gloves were used for the procedure. Local anesthesia was provided with 1% Lidocaine. The left flank was interrogated with ultrasound and the left kidney identified. The kidney  is hydronephrotic. A suitable access site on the skin overlying the lower pole, posterior calix was identified. After local mg anesthesia was achieved, a small skin nick was made with an 11 blade scalpel. A 21 gauge Accustick needle was then advanced under direct sonographic guidance into the lower pole of the left kidney. A 0.018 inch wire was advanced under fluoroscopic guidance into the left renal collecting system. The Accustick sheath was then advanced over the wire and a 0.018 system exchanged for a 0.035 system. Gentle hand injection of contrast material confirms placement of the sheath within the renal collecting system. There is hydronephrosis. The tract from the scan into the renal collecting system was then dilated serially to 10-French. A 10-French Cook all-purpose drain was then placed and positioned under fluoroscopic guidance. The locking loop is well formed within the left renal pelvis. The catheter was secured to the skin with 2-0 Prolene and a sterile bandage was placed.  Catheter was left to gravity bag drainage. RIGHT PCN The right flank was prepped with chlorhexidine in a sterile fashion, and a sterile drape was applied covering the operative field. A sterile gown and sterile gloves were used for the procedure. Local anesthesia was provided with 1% Lidocaine. The right flank was interrogated with ultrasound and the left kidney identified. The kidney is hydronephrotic. A suitable access site on the skin overlying the lower pole, posterior calix was identified. After local mg anesthesia was achieved, a small skin nick was made with an 11 blade scalpel. A 21 gauge Accustick needle was then advanced under direct sonographic guidance into the lower pole of the right kidney. A 0.018 inch wire was advanced under fluoroscopic guidance into the left renal collecting system. The Accustick sheath was then advanced over the wire and a 0.018 system exchanged for a 0.035 system. Gentle hand injection of  contrast material confirms placement of the sheath within the renal collecting system. There is marked hydronephrosis. The tract from the scan into the renal collecting system was then dilated serially to 10-French. A 10-French Cook all-purpose drain was then placed and positioned under fluoroscopic guidance. The locking loop is well formed within the left renal pelvis. The catheter was secured to the skin with 2-0 Prolene and a sterile bandage was placed. Catheter was left to gravity bag drainage. IMPRESSION: Successful placement of a bilateral 10 French percutaneous nephrostomy tubes. Signed, Criselda Peaches, MD [facility] Radiology Electronically Signed   By: Jacqulynn Cadet M.D.   On: 10/01/2017 17:03        Scheduled Meds: . amLODipine  10 mg Oral Daily  . calcitonin  4 Units/kg Subcutaneous BID  . cholecalciferol  2,000 Units Oral Daily  . feeding supplement (ENSURE ENLIVE)  237 mL Oral BID  . finasteride  5 mg Oral Daily  . folic acid  1 mg Oral Daily  . heparin  5,000 Units Subcutaneous Q8H  . levothyroxine  100 mcg Oral QAC breakfast  . megestrol  400 mg Oral Daily  . metoprolol succinate  100 mg Oral Daily  . multivitamin with minerals  1 tablet Oral Daily  . potassium chloride  40 mEq Oral Once  . senna  1 tablet Oral BID  . sodium chloride flush  3 mL Intravenous Q12H  . sodium chloride flush  5 mL Intracatheter Q8H  . tamsulosin  0.4 mg Oral Daily  . thiamine  100 mg Oral Daily   Continuous Infusions: . sodium chloride 150 mL/hr at 10/02/17 0050  . sodium chloride    . cefTAZidime (FORTAZ)  IV Stopped (10/02/17 1239)     LOS: 2 days    Time spent: over 41 min    Fayrene Helper, MD [facility] Pager [phone]  If 7PM-7AM, please contact night-coverage [email or web address] Password [facility] 10/02/2017, 7:28 PM

## 2017-10-02 NOTE — Consult Note (Signed)
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 drainage. IMPRESSION: Successful placement of a bilateral 10 French percutaneous nephrostomy tubes. Signed, Criselda Peaches, MD [facility] Radiology Electronically Signed   By: Jacqulynn Cadet M.D.   On: 10/01/2017 17:03   Ir Nephrostomy Placement Right  Result Date: 10/01/2017 INDICATION: 72 year old male with bilateral hydronephrosis in the setting of prostate cancer. EXAM: IR NEPHROSTOMY PLACEMENT RIGHT; IR NEPHROSTOMY PLACEMENT LEFT COMPARISON:  CT abdomen/pelvis 09/30/2017 MEDICATIONS: 1 g Rocephin; The antibiotic was administered in an appropriate time frame prior to skin puncture. ANESTHESIA/SEDATION: Fentanyl 1 mcg IV; Versed 75 mg IV Moderate Sedation Time:  23 minutes The patient was continuously monitored during the procedure by the interventional radiology nurse under my direct supervision. CONTRAST:  23mL ISOVUE-300 IOPAMIDOL (ISOVUE-300) INJECTION 61%, 53mL ISOVUE-300 IOPAMIDOL (ISOVUE-300) INJECTION 61% - administered into the collecting system(s) FLUOROSCOPY TIME:  Fluoroscopy Time: 2 minutes 24 seconds (11 mGy). COMPLICATIONS: None immediate. TECHNIQUE: The procedure, risks, benefits, and alternatives were explained to the patient. Questions regarding the procedure  were encouraged and answered. The patient understands and consents to the procedure. LEFT PCN The left flank was prepped with chlorhexidine in a sterile fashion, and a sterile drape was applied covering the operative field. A sterile gown and sterile gloves were used for the procedure. Local anesthesia was provided with 1% Lidocaine. The left flank was interrogated with ultrasound and the left kidney identified. The kidney is hydronephrotic. A suitable access site on the skin overlying the lower pole, posterior calix was identified. After local mg anesthesia was achieved, a small skin nick was made with an 11 blade scalpel. A 21 gauge Accustick needle was then advanced under direct sonographic guidance into the lower pole of the left kidney. A 0.018 inch wire was advanced under fluoroscopic guidance into the left renal collecting system. The Accustick sheath was then advanced over the wire and a 0.018 system exchanged for a 0.035 system. Gentle hand injection of contrast material confirms placement of the sheath within the renal collecting system. There is hydronephrosis. The tract from the scan into the renal collecting system was then dilated serially to 10-French. A 10-French Cook all-purpose drain was then placed and positioned under fluoroscopic guidance. The locking loop is well formed within the left renal pelvis. The catheter was secured to the skin with 2-0 Prolene and a sterile bandage was placed. Catheter was left to gravity bag drainage. RIGHT PCN The right flank was prepped with chlorhexidine in a sterile fashion, and a sterile drape was applied covering the operative field. A sterile gown and sterile gloves were used for the procedure. Local anesthesia was provided with 1% Lidocaine. The right flank was interrogated with ultrasound and the left kidney identified. The kidney is hydronephrotic. A suitable access site on the skin overlying the lower pole, posterior calix was identified. After local mg  anesthesia was achieved, a small skin nick was made with an 11 blade scalpel. A 21  gauge Accustick needle was then advanced under direct sonographic guidance into the lower pole of the right kidney. A 0.018 inch wire was advanced under fluoroscopic guidance into the left renal collecting system. The Accustick sheath was then advanced over the wire and a 0.018 system exchanged for a 0.035 system. Gentle hand injection of contrast material confirms placement of the sheath within the renal collecting system. There is marked hydronephrosis. The tract from the scan into the renal collecting system was then dilated serially to 10-French. A 10-French Cook all-purpose drain was then placed and positioned under fluoroscopic guidance. The locking loop is well formed within the left renal pelvis. The catheter was secured to the skin with 2-0 Prolene and a sterile bandage was placed. Catheter was left to gravity bag drainage. IMPRESSION: Successful placement of a bilateral 10 French percutaneous nephrostomy tubes. Signed, Criselda Peaches, MD [facility] Radiology Electronically Signed   By: Jacqulynn Cadet M.D.   On: 10/01/2017 17:03    Assessment and Plan:   1.  Prostate cancer 2005 treated with radiation seed therapy 2.  Hypercalcemia 3.  Obstructive nephropathy with hydronephrosis and acute renal failure 4.  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  Diverting colostomy 6.  Weight loss 7.  Altered mental status 8.  History of tobacco and alcohol use   Mr. Marik is admitted with hypercalcemia, altered mental status, and failure to thrive.  The symptoms have progressed since he was admitted at [facility] last month.  The presentation is likely secondary to a progressive malignancy.  He may have progression of a undifferentiated prostate cancer versus a second primary malignancy. A liver biopsy at [facility] last month was  non-diagnostic.  I think we should establish a diagnosis of malignancy and then make a decision on a trial of systemic therapy versus Hospice care.  Recommendations: 1.  Review images with interventional radiology to consider biopsy of the liver, pubis, or pelvic soft tissue 2.  I will attempt to obtain the prostate pathology report from 2005 3.  Continue intravenous hydration, follow renal function and calcium 4.  Have discussion with family regarding follow-up plan here or in [facility], he has an appointment at [facility] next week    Betsy Coder, MD 10/02/2017, 12:43 PM

## 2017-10-02 NOTE — Progress Notes (Signed)
Pharmacy Antibiotic Note  Ricardo Johnson is a 72 y.o. male admitted on 09/30/2017 with confusion & lethargy, found HyperCalcemic, possible recurrence of Ca. Pharmacy has been consulted for Ceftazidime dosing for UCx growing Pseudomonas.  Plan:   Height: 5\' 6"  (167.6 cm) Weight: 110 lb (49.9 kg) IBW/kg (Calculated) : 63.8  Temp (24hrs), Avg:98 F (36.7 C), Min:97.4 F (36.3 C), Max:98.5 F (36.9 C)  Recent Labs  Lab 09/30/17 1218 10/01/17 0520 10/02/17 1028  WBC 10.2 9.9 10.5  CREATININE 3.53* 3.26*  --     Estimated Creatinine Clearance: 14.2 mL/min (A) (by C-G formula based on SCr of 3.26 mg/dL (H)).    No Known Allergies  Antimicrobials this admission: 4/16 CTX>> 4/18 4/18 Ceftaz  Microbiology results: 4/16 UCx: > 100K Pseudomonas  Thank you for allowing pharmacy to be a part of this patient's care.  Minda Ditto PharmD Pager [phone] 10/02/2017, 1:23 PM

## 2017-10-03 ENCOUNTER — Encounter (HOSPITAL_COMMUNITY): Payer: Self-pay | Admitting: Radiology

## 2017-10-03 ENCOUNTER — Inpatient Hospital Stay (HOSPITAL_COMMUNITY): Payer: Medicare HMO

## 2017-10-03 LAB — GLUCOSE, CAPILLARY
GLUCOSE-CAPILLARY: 115 mg/dL — AB (ref 65–99)
Glucose-Capillary: 155 mg/dL — ABNORMAL HIGH (ref 65–99)
Glucose-Capillary: 134 mg/dL — ABNORMAL HIGH (ref 65–99)
Glucose-Capillary: 131 mg/dL — ABNORMAL HIGH (ref 65–99)

## 2017-10-03 LAB — CBC
HEMATOCRIT: 32.3 % — AB (ref 39.0–52.0)
HEMOGLOBIN: 10.6 g/dL — AB (ref 13.0–17.0)
MCH: 29.3 pg (ref 26.0–34.0)
MCHC: 32.8 g/dL (ref 30.0–36.0)
MCV: 89.2 fL (ref 78.0–100.0)
Platelets: 323 10*3/uL (ref 150–400)
RBC: 3.62 MIL/uL — ABNORMAL LOW (ref 4.22–5.81)
RDW: 12.9 % (ref 11.5–15.5)
WBC: 11.3 10*3/uL — ABNORMAL HIGH (ref 4.0–10.5)

## 2017-10-03 LAB — COMPREHENSIVE METABOLIC PANEL
ALK PHOS: 64 U/L (ref 38–126)
ALT: 9 U/L — ABNORMAL LOW (ref 17–63)
ANION GAP: 9 (ref 5–15)
AST: 15 U/L (ref 15–41)
Albumin: 2.5 g/dL — ABNORMAL LOW (ref 3.5–5.0)
BILIRUBIN TOTAL: 0.3 mg/dL (ref 0.3–1.2)
BUN: 17 mg/dL (ref 6–20)
CALCIUM: 9.9 mg/dL (ref 8.9–10.3)
CO2: 19 mmol/L — ABNORMAL LOW (ref 22–32)
Chloride: 117 mmol/L — ABNORMAL HIGH (ref 101–111)
Creatinine, Ser: 1.23 mg/dL (ref 0.61–1.24)
GFR, EST NON AFRICAN AMERICAN: 56 mL/min — AB (ref >60)
Glucose, Bld: 173 mg/dL — ABNORMAL HIGH (ref 65–99)
POTASSIUM: 2.7 mmol/L — AB (ref 3.5–5.1)
Sodium: 145 mmol/L (ref 135–145)
TOTAL PROTEIN: 6.9 g/dL (ref 6.5–8.1)

## 2017-10-03 LAB — URINE CULTURE: Culture: >=100000 — AB

## 2017-10-03 LAB — MAGNESIUM: MAGNESIUM: 1.6 mg/dL — AB (ref 1.7–2.4)

## 2017-10-03 LAB — POTASSIUM: POTASSIUM: 3.2 mmol/L — AB (ref 3.5–5.1)

## 2017-10-03 MED ORDER — LIDOCAINE HCL 1 % IJ SOLN
INTRAMUSCULAR | Status: AC | PRN
  Administered 2017-10-03: 10 mL via INTRADERMAL

## 2017-10-03 MED ORDER — MIDAZOLAM HCL 2 MG/2ML IJ SOLN
INTRAMUSCULAR | Status: AC | PRN
  Administered 2017-10-03 (×2): 1 mg via INTRAVENOUS

## 2017-10-03 MED ORDER — MAGNESIUM SULFATE 2 GM/50ML IV SOLN
2.0000 g | Freq: Once | INTRAVENOUS | Status: AC
  Administered 2017-10-04: 2 g via INTRAVENOUS
  Filled 2017-10-03: qty 50

## 2017-10-03 MED ORDER — POTASSIUM CHLORIDE 10 MEQ/100ML IV SOLN
INTRAVENOUS | Status: AC
  Administered 2017-10-03: 10 meq
  Filled 2017-10-03: qty 100

## 2017-10-03 MED ORDER — MIDAZOLAM HCL 2 MG/2ML IJ SOLN
INTRAMUSCULAR | Status: AC
  Filled 2017-10-03: qty 2

## 2017-10-03 MED ORDER — POTASSIUM CHLORIDE 10 MEQ/100ML IV SOLN
10.0000 meq | INTRAVENOUS | Status: AC
  Administered 2017-10-03 (×4): 10 meq via INTRAVENOUS
  Filled 2017-10-03 (×3): qty 100

## 2017-10-03 MED ORDER — FENTANYL CITRATE (PF) 100 MCG/2ML IJ SOLN
INTRAMUSCULAR | Status: AC
  Filled 2017-10-03: qty 2

## 2017-10-03 MED ORDER — POTASSIUM CHLORIDE 10 MEQ/100ML IV SOLN
INTRAVENOUS | Status: AC
  Filled 2017-10-03: qty 100

## 2017-10-03 MED ORDER — CEFTAZIDIME 1 G IJ SOLR
1.0000 g | Freq: Two times a day (BID) | INTRAMUSCULAR | Status: DC
  Administered 2017-10-03 – 2017-10-04 (×3): 1 g via INTRAVENOUS
  Filled 2017-10-03 (×3): qty 1

## 2017-10-03 MED ORDER — FENTANYL CITRATE (PF) 100 MCG/2ML IJ SOLN
INTRAMUSCULAR | Status: AC | PRN
  Administered 2017-10-03 (×2): 50 ug via INTRAVENOUS

## 2017-10-03 MED ORDER — HEPARIN SODIUM (PORCINE) 5000 UNIT/ML IJ SOLN
5000.0000 [IU] | Freq: Three times a day (TID) | INTRAMUSCULAR | Status: DC
  Administered 2017-10-03 – 2017-10-07 (×10): 5000 [IU] via SUBCUTANEOUS
  Filled 2017-10-03 (×11): qty 1

## 2017-10-03 NOTE — Progress Notes (Signed)
Dr. Jarvis Newcomer made aware of critical K of 2.7. Okay to proceed with IR bx at this time per Dr. Jarvis Newcomer

## 2017-10-03 NOTE — Progress Notes (Signed)
Pharmacy Antibiotic Note  Ricardo Johnson is a 72 y.o. male admitted on 09/30/2017 with confusion & lethargy, found HyperCalcemic, possible recurrence of Ca. Pharmacy has been consulted for Ceftazidime dosing for UCx growing Pseudomonas.  Plan: Increase Ceftazidime to 1gm q12 with improved renal function  Height: 5\' 6"  (167.6 cm) Weight: 110 lb (49.9 kg) IBW/kg (Calculated) : 63.8  Temp (24hrs), Avg:97.8 F (36.6 C), Min:97.4 F (36.3 C), Max:98 F (36.7 C)  Recent Labs  Lab 09/30/17 1218 10/01/17 0520 10/02/17 1028 10/03/17 0606  WBC 10.2 9.9 10.5 11.3*  CREATININE 3.53* 3.26* 1.60* 1.23    Estimated Creatinine Clearance: 37.8 mL/min (by C-G formula based on SCr of 1.23 mg/dL).    No Known Allergies  Antimicrobials this admission: 4/16 CTX>> 4/18 4/18 Ceftaz  Microbiology results: 4/16 UCx: > 100K Pseudomonas aeruginosa                  > 100K Klebsiella pneumonia  Thank you for allowing pharmacy to be a part of this patient's care.  Minda Ditto PharmD Pager [phone] 10/03/2017, 8:34 AM

## 2017-10-03 NOTE — Progress Notes (Signed)
IP PROGRESS NOTE  Subjective:   Mr. Klingbeil reports discomfort at the base of the penis.  He also reports drainage from the penis.  Objective: Vital signs in last 24 hours: Blood pressure 125/68, pulse 64, temperature 98 F (36.7 C), temperature source Oral, resp. rate 18, height 5\' 6"  (1.676 m), weight 110 lb (49.9 kg), SpO2 100 %.  Intake/Output from previous day: 04/18 0701 - 04/19 0700 In: 4168 [P.O.:60; I.V.:4008; IV Piggyback:100] Out: 4450 [Urine:4250; Stool:200]  Physical Exam:  GU: Suprapubic catheter, there is a light tan drainage from the penis, no palpable tumor at the base of the penis or pubic area Extremities: No leg edema Neurologic: Follows commands, remains a poor historian and appears confused   Lab Results: Recent Labs    10/02/17 1028 10/03/17 0606  WBC 10.5 11.3*  HGB 9.6* 10.6*  HCT 29.9* 32.3*  PLT 308 323    BMET Recent Labs    10/02/17 1028 10/03/17 0606  NA 148* 145  K 3.2* 2.7*  CL 123* 117*  CO2 17* 19*  GLUCOSE 135* 173*  BUN 32* 17  CREATININE 1.60* 1.23  CALCIUM 10.2 9.9    No results found for: CEA1  Studies/Results: Ir Nephrostomy Placement Left  Result Date: 10/01/2017 INDICATION: 72 year old male with bilateral hydronephrosis in the setting of prostate cancer. EXAM: IR NEPHROSTOMY PLACEMENT RIGHT; IR NEPHROSTOMY PLACEMENT LEFT COMPARISON:  CT abdomen/pelvis 09/30/2017 MEDICATIONS: 1 g Rocephin; The antibiotic was administered in an appropriate time frame prior to skin puncture. ANESTHESIA/SEDATION: Fentanyl 1 mcg IV; Versed 75 mg IV Moderate Sedation Time:  23 minutes The patient was continuously monitored during the procedure by the interventional radiology nurse under my direct supervision. CONTRAST:  17mL ISOVUE-300 IOPAMIDOL (ISOVUE-300) INJECTION 61%, 55mL ISOVUE-300 IOPAMIDOL (ISOVUE-300) INJECTION 61% - administered into the collecting system(s) FLUOROSCOPY TIME:  Fluoroscopy Time: 2 minutes 24 seconds (11 mGy).  COMPLICATIONS: None immediate. TECHNIQUE: The procedure, risks, benefits, and alternatives were explained to the patient. Questions regarding the procedure were encouraged and answered. The patient understands and consents to the procedure. LEFT PCN The left flank was prepped with chlorhexidine in a sterile fashion, and a sterile drape was applied covering the operative field. A sterile gown and sterile gloves were used for the procedure. Local anesthesia was provided with 1% Lidocaine. The left flank was interrogated with ultrasound and the left kidney identified. The kidney is hydronephrotic. A suitable access site on the skin overlying the lower pole, posterior calix was identified. After local mg anesthesia was achieved, a small skin nick was made with an 11 blade scalpel. A 21 gauge Accustick needle was then advanced under direct sonographic guidance into the lower pole of the left kidney. A 0.018 inch wire was advanced under fluoroscopic guidance into the left renal collecting system. The Accustick sheath was then advanced over the wire and a 0.018 system exchanged for a 0.035 system. Gentle hand injection of contrast material confirms placement of the sheath within the renal collecting system. There is hydronephrosis. The tract from the scan into the renal collecting system was then dilated serially to 10-French. A 10-French Cook all-purpose drain was then placed and positioned under fluoroscopic guidance. The locking loop is well formed within the left renal pelvis. The catheter was secured to the skin with 2-0 Prolene and a sterile bandage was placed. Catheter was left to gravity bag drainage. RIGHT PCN The right flank was prepped with chlorhexidine in a sterile fashion, and a sterile drape was applied covering the operative  field. A sterile gown and sterile gloves were used for the procedure. Local anesthesia was provided with 1% Lidocaine. The right flank was interrogated with ultrasound and the left  kidney identified. The kidney is hydronephrotic. A suitable access site on the skin overlying the lower pole, posterior calix was identified. After local mg anesthesia was achieved, a small skin nick was made with an 11 blade scalpel. A 21 gauge Accustick needle was then advanced under direct sonographic guidance into the lower pole of the right kidney. A 0.018 inch wire was advanced under fluoroscopic guidance into the left renal collecting system. The Accustick sheath was then advanced over the wire and a 0.018 system exchanged for a 0.035 system. Gentle hand injection of contrast material confirms placement of the sheath within the renal collecting system. There is marked hydronephrosis. The tract from the scan into the renal collecting system was then dilated serially to 10-French. A 10-French Cook all-purpose drain was then placed and positioned under fluoroscopic guidance. The locking loop is well formed within the left renal pelvis. The catheter was secured to the skin with 2-0 Prolene and a sterile bandage was placed. Catheter was left to gravity bag drainage. IMPRESSION: Successful placement of a bilateral 10 French percutaneous nephrostomy tubes. Signed, Criselda Peaches, MD [facility] Radiology Electronically Signed   By: Jacqulynn Cadet M.D.   On: 10/01/2017 17:03   Ir Nephrostomy Placement Right  Result Date: 10/01/2017 INDICATION: 72 year old male with bilateral hydronephrosis in the setting of prostate cancer. EXAM: IR NEPHROSTOMY PLACEMENT RIGHT; IR NEPHROSTOMY PLACEMENT LEFT COMPARISON:  CT abdomen/pelvis 09/30/2017 MEDICATIONS: 1 g Rocephin; The antibiotic was administered in an appropriate time frame prior to skin puncture. ANESTHESIA/SEDATION: Fentanyl 1 mcg IV; Versed 75 mg IV Moderate Sedation Time:  23 minutes The patient was continuously monitored during the procedure by the interventional radiology nurse under my direct supervision.  CONTRAST:  45mL ISOVUE-300 IOPAMIDOL (ISOVUE-300) INJECTION 61%, 20mL ISOVUE-300 IOPAMIDOL (ISOVUE-300) INJECTION 61% - administered into the collecting system(s) FLUOROSCOPY TIME:  Fluoroscopy Time: 2 minutes 24 seconds (11 mGy). COMPLICATIONS: None immediate. TECHNIQUE: The procedure, risks, benefits, and alternatives were explained to the patient. Questions regarding the procedure were encouraged and answered. The patient understands and consents to the procedure. LEFT PCN The left flank was prepped with chlorhexidine in a sterile fashion, and a sterile drape was applied covering the operative field. A sterile gown and sterile gloves were used for the procedure. Local anesthesia was provided with 1% Lidocaine. The left flank was interrogated with ultrasound and the left kidney identified. The kidney is hydronephrotic. A suitable access site on the skin overlying the lower pole, posterior calix was identified. After local mg anesthesia was achieved, a small skin nick was made with an 11 blade scalpel. A 21 gauge Accustick needle was then advanced under direct sonographic guidance into the lower pole of the left kidney. A 0.018 inch wire was advanced under fluoroscopic guidance into the left renal collecting system. The Accustick sheath was then advanced over the wire and a 0.018 system exchanged for a 0.035 system. Gentle hand injection of contrast material confirms placement of the sheath within the renal collecting system. There is hydronephrosis. The tract from the scan into the renal collecting system was then dilated serially to 10-French. A 10-French Cook all-purpose drain was then placed and positioned under fluoroscopic guidance. The locking loop is well formed within the left renal pelvis. The catheter was secured to the skin with 2-0 Prolene  and a sterile bandage was placed. Catheter was left to gravity bag drainage. RIGHT PCN The right flank was prepped with chlorhexidine in a sterile fashion, and a  sterile drape was applied covering the operative field. A sterile gown and sterile gloves were used for the procedure. Local anesthesia was provided with 1% Lidocaine. The right flank was interrogated with ultrasound and the left kidney identified. The kidney is hydronephrotic. A suitable access site on the skin overlying the lower pole, posterior calix was identified. After local mg anesthesia was achieved, a small skin nick was made with an 11 blade scalpel. A 21 gauge Accustick needle was then advanced under direct sonographic guidance into the lower pole of the right kidney. A 0.018 inch wire was advanced under fluoroscopic guidance into the left renal collecting system. The Accustick sheath was then advanced over the wire and a 0.018 system exchanged for a 0.035 system. Gentle hand injection of contrast material confirms placement of the sheath within the renal collecting system. There is marked hydronephrosis. The tract from the scan into the renal collecting system was then dilated serially to 10-French. A 10-French Cook all-purpose drain was then placed and positioned under fluoroscopic guidance. The locking loop is well formed within the left renal pelvis. The catheter was secured to the skin with 2-0 Prolene and a sterile bandage was placed. Catheter was left to gravity bag drainage. IMPRESSION: Successful placement of a bilateral 10 French percutaneous nephrostomy tubes. Signed, Criselda Peaches, MD [facility] Radiology Electronically Signed   By: Jacqulynn Cadet M.D.   On: 10/01/2017 17:03    Medications: I have reviewed the patient's current medications.  Assessment/Plan: 1.  Prostate cancer 2005, Gleason 6, minute focus of prostate adenocarcinoma involving right prostate biopsies, Gleason 6 adenocarcinoma involving less than 10% of the left prostate biopsies, treated with radiation seed therapy 2.  Hypercalcemia-resolved following  intravenous hydration and pamidronate 3.  Obstructive nephropathy with hydronephrosis and acute renal failure-renal failure has improved  Status post bilateral percutaneous nephrostomy tubes 10/01/2017. 4.  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  Diverting colostomy 6.  Weight loss 7.  Altered mental status 8.  History of tobacco and alcohol use  The renal failure and hypercalcemia have improved. I reviewed the CT images and radiology.  There appears to be tumor involving the prostate and adjacent soft tissues with destruction of the left pubis. This would be an unusual presentation for recurrence of the low-grade adenocarcinoma of the prostate diagnosed in 2005.  He could have a secondary malignancy or a urothelial carcinoma.  I recommended proceeding with a diagnostic biopsy.  When the pathology is available we can decide on hospice care versus a trial of systemic therapy.  His family is not present this morning.  I will discuss the case with his family when they are available.  Recommendations: 1.  Proceed with diagnostic biopsy of tumor at the left pubis 2.  Continue supportive care measures 3.  Please call Oncology as needed over the weekend, I will see him 10/06/2017    LOS: 3 days   Betsy Coder, MD   10/03/2017, 8:36 AM

## 2017-10-03 NOTE — Procedures (Signed)
  Procedure: CT core L pubic bone lesion 18gx3 EBL:   minimal Complications:  none immediate  See full dictation in [facility].  Dillard Cannon MD Main # [phone] Pager  [phone]

## 2017-10-03 NOTE — Progress Notes (Addendum)
PROGRESS NOTE    Ricardo Johnson  ELF:[id number] DOB: October 13, 1945 DOA: 09/30/2017 PCP: [name], MD   Brief Narrative: Patient is a 72 year old male with past medical history  of hypertension, COPD, previous smoker, alcohol abuse, nephrolithiasis, pressure cancer diagnosed 20 years ago status post brachytherapy 2005, prior treatment at [facility] with known rectovesical fistula of the bladder neck as well as obliterated urethra, status post colostomy in 2018 October who was admitted here for management of   altered mental status, lethargy, confusion and hypercalcemia.   Assessment & Plan:   Principal Problem:   Hypercalcemia Active Problems:   ADENOCARCINOMA, PROSTATE with Liver Mets   Essential hypertension   AKI (acute kidney injury) ([place])   Anemia   Suprapubic catheter in place   Acute bilateral obstructive uropathy/Bil hydroureter and Bil hydronephrosis   Encounter for palliative care   Goals of care, counseling/discussion   Protein-calorie malnutrition, severe  Acute kidney injury: Resolved. Initially presented with acute kidney injury due to obstruction and dehydration.  Patient was also on ACE inhibitors, naproxen and hydrochlorthiazide at home. Presented with creatinine of 3.5 .  Baseline creatinine 1. Acute kidney injury resolved with percutaneous nephrostomy tube placement.  Continue gentle IV fluids. Status post bilateral kidneys nephrostomy tube placement on 4/17  Hypokalemia/hypomagnesemia: Being supplemented..We will check levels tomorrow.  Hypernatremia: Improved.  Metastatic Cancer:  Hx of prostate cancer stage T1c gleason 3+3 s/p brachytherapy in 2005.  Concern for prostate cancer on imaging, but normal PSA.  Pathology from recent liver biopsy on 3/28 (care everywhere) suspicious for "possible metastatic prostatic adenocarcinoma, but not definitive."  Per oncology, concerning for undifferentiated prostate cancer vs second primary malignancy.    Patient underwent  biopsy today.  Obstructive Uropathy with AKI and Bilateral Hydronephrosis and Hydroureter:  S/p perc nephrostomy tubes on 4/17 by IR Seen by [name] on 4/16 who recommended bilateral nephrostomy tube placement for decompression (of note, pt with hx of obliterated urethra and unable to place ureteral stents.  Pt with suprapubic catheter) Pt sees urologist, [name] at [facility], flomax  Obliterated urethra with rectorethral/rectovesical fisula: Suprapubic catheter and s/p bowel diversion.  Of note, [name] noted clear yellow drainage in colostomy bag (concerning for possible new fistula,S/P  diversion with nephrostomy tubes which has been done).  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  Dietician c/s.  Poor PO intake over past few weeks.     Acute on chronic Anemia- hemoglobin isdown to 9.6 from 12.2 on admission due to hemodilution, suspect chronic anemia related to underlying malignancy.  We will continue to monitor H&H  HTN-hold lisinopril and HCTZ due to AKIas above #1,continue amlodipine 10 mg daily,metoprolol 100 mg daily  UTI: Urine culture grew Pseudomonas and Klebsiella, both pan sensitive .  Currently on ceftazidime .  We will change to ciprofloxacin soon.   DVT prophylaxis: Heparin [place] Code Status: Full Family Communication: None present at the bedside Disposition Plan: Needs to be determined   Consultants: Urology, oncology  Procedures: Bilateral percutaneous nephrostomy tubes on 4/17  Antimicrobials: Ceftazidime since 10/02/2017.  Subjective: Patient seen and examined the pressure this morning.  Remains comfortable.  Denies overnight fever, nausea or vomiting.  Objective: Vitals:   10/03/17 1245 10/03/17 1250 10/03/17 1255 10/03/17 1427  BP: 117/62 101/68 106/70 132/60  Pulse: 75 74 73 76  Resp: 12 11 10 16     Temp:  98.2 F (36.8 C)  TempSrc:    Oral  SpO2: 100% 100% 100% 99%  Weight:      Height:        Intake/Output Summary (Last 24 hours) at 10/03/2017 1438 Last data filed at 10/03/2017 1349 Gross per 24 hour  Intake 4108 ml  Output 4100 ml  Net 8 ml   Filed Weights   09/30/17 1856  Weight: 49.9 kg (110 lb)    Examination:  General exam: Appears calm and comfortable ,Not in distress, thin/cachectic, chronically ill HEENT:PERRL,Oral mucosa moist, Ear/Nose normal on gross exam Respiratory system: Bilateral equal air entry, normal vesicular breath sounds, no wheezes or crackles  Cardiovascular system: S1 & S2 heard, RRR. No JVD, murmurs, rubs, gallops or clicks. No pedal edema. Gastrointestinal system: Abdomen is nondistended, soft and nontender. No organomegaly or masses felt. Normal bowel sounds heard.  Colostomy bilateral percutaneous nephrostomy tubes, Central nervous system: Alert and oriented. No focal neurological deficits. Extremities: No edema, no clubbing ,no cyanosis, distal peripheral pulses palpable. Skin: No rashes, lesions or ulcers,no icterus ,no pallor    Data Reviewed: I have personally reviewed following labs and imaging studies  CBC: Recent Labs  Lab 09/30/17 1218 10/01/17 0520 10/02/17 1028 10/03/17 0606  WBC 10.2 9.9 10.5 11.3*  NEUTROABS  --   --  8.3*  --   HGB 12.2* 9.7* 9.6* 10.6*  HCT 37.2* 29.8* 29.9* 32.3*  MCV 89.4 89.2 90.6 89.2  PLT 374 325 308 756   Basic Metabolic Panel: Recent Labs  Lab 09/30/17 1218 10/01/17 0520 10/02/17 1028 10/03/17 0606  NA 142 143 148* 145  K 3.4* 3.7 3.2* 2.7*  CL 110 117* 123* 117*  CO2 19* 13* 17* 19*  GLUCOSE 100* 132* 135* 173*  BUN 73* 62* 32* 17  CREATININE 3.53* 3.26* 1.60* 1.23  CALCIUM 15.0* 11.9* 10.2 9.9  MG  --   --  1.5* 1.6*   GFR: Estimated Creatinine Clearance: 37.8 mL/min (by C-G formula based on SCr of 1.23 mg/dL). Liver Function Tests: Recent Labs  Lab 09/30/17 1218  10/01/17 0520 10/02/17 1028 10/03/17 0606  AST 11* 11*  --  15  ALT 10* 8*  --  9*  ALKPHOS 84 60  --  64  BILITOT 0.4 0.5  --  0.3  PROT 8.4* 6.3*  --  6.9  ALBUMIN 3.2* 2.3* 2.4* 2.5*   Recent Labs  Lab 09/30/17 1218  LIPASE 18   No results for input(s): AMMONIA in the last 168 hours. Coagulation Profile: Recent Labs  Lab 10/01/17 0934  INR 1.23   Cardiac Enzymes: No results for input(s): CKTOTAL, CKMB, CKMBINDEX, TROPONINI in the last 168 hours. BNP (last 3 results) No results for input(s): PROBNP in the last 8760 hours. HbA1C: No results for input(s): HGBA1C in the last 72 hours. CBG: Recent Labs  Lab 10/02/17 2141 10/03/17 0745 10/03/17 1145  GLUCAP 126* 155* 134*   Lipid Profile: No results for input(s): CHOL, HDL, LDLCALC, TRIG, CHOLHDL, LDLDIRECT in the last 72 hours. Thyroid Function Tests: No results for input(s): TSH, T4TOTAL, FREET4, T3FREE, THYROIDAB in the last 72 hours. Anemia Panel: No results for input(s): VITAMINB12, FOLATE, FERRITIN, TIBC, IRON, RETICCTPCT in the last 72 hours. Sepsis Labs: No results for input(s): PROCALCITON, LATICACIDVEN in the last 168 hours.  Recent Results (from the past 240 hour(s))  Urine Culture     Status: Abnormal   Collection Time: 09/30/17  3:48 PM  Result Value Ref Range Status  Specimen Description   Final    URINE, SUPRAPUBIC Performed at [facility], [place] [address], [place], [place] [address]    Special Requests   Final    Immunocompromised Performed at [facility], [place] [address], [place], [place] [address]    Culture (A)  Final    >=100,000 COLONIES/mL PSEUDOMONAS AERUGINOSA >=100,000 COLONIES/mL KLEBSIELLA PNEUMONIAE    Report Status 10/03/2017 FINAL  Final   Organism ID, Bacteria PSEUDOMONAS AERUGINOSA (A)  Final   Organism ID, Bacteria KLEBSIELLA PNEUMONIAE (A)  Final      Susceptibility   Klebsiella pneumoniae - MIC*    AMPICILLIN >=32 RESISTANT  Resistant     CEFAZOLIN <=4 SENSITIVE Sensitive     CEFTRIAXONE <=1 SENSITIVE Sensitive     CIPROFLOXACIN <=0.25 SENSITIVE Sensitive     GENTAMICIN <=1 SENSITIVE Sensitive     IMIPENEM <=0.25 SENSITIVE Sensitive     NITROFURANTOIN 32 SENSITIVE Sensitive     TRIMETH/SULFA <=20 SENSITIVE Sensitive     AMPICILLIN/SULBACTAM 4 SENSITIVE Sensitive     PIP/TAZO <=4 SENSITIVE Sensitive     Extended ESBL NEGATIVE Sensitive     * >=100,000 COLONIES/mL KLEBSIELLA PNEUMONIAE   Pseudomonas aeruginosa - MIC*    CEFTAZIDIME 4 SENSITIVE Sensitive     CIPROFLOXACIN <=0.25 SENSITIVE Sensitive     GENTAMICIN <=1 SENSITIVE Sensitive     IMIPENEM 1 SENSITIVE Sensitive     PIP/TAZO 8 SENSITIVE Sensitive     CEFEPIME 2 SENSITIVE Sensitive     * >=100,000 COLONIES/mL PSEUDOMONAS AERUGINOSA         Radiology Studies: Ir Nephrostomy Placement Left  Result Date: 10/01/2017 INDICATION: 72 year old male with bilateral hydronephrosis in the setting of prostate cancer. EXAM: IR NEPHROSTOMY PLACEMENT RIGHT; IR NEPHROSTOMY PLACEMENT LEFT COMPARISON:  CT abdomen/pelvis 09/30/2017 MEDICATIONS: 1 g Rocephin; The antibiotic was administered in an appropriate time frame prior to skin puncture. ANESTHESIA/SEDATION: Fentanyl 1 mcg IV; Versed 75 mg IV Moderate Sedation Time:  23 minutes The patient was continuously monitored during the procedure by the interventional radiology nurse under my direct supervision. CONTRAST:  75mL ISOVUE-300 IOPAMIDOL (ISOVUE-300) INJECTION 61%, 67mL ISOVUE-300 IOPAMIDOL (ISOVUE-300) INJECTION 61% - administered into the collecting system(s) FLUOROSCOPY TIME:  Fluoroscopy Time: 2 minutes 24 seconds (11 mGy). COMPLICATIONS: None immediate. TECHNIQUE: The procedure, risks, benefits, and alternatives were explained to the patient. Questions regarding the procedure were encouraged and answered. The patient understands and consents to the procedure. LEFT PCN The left flank was prepped with chlorhexidine  in a sterile fashion, and a sterile drape was applied covering the operative field. A sterile gown and sterile gloves were used for the procedure. Local anesthesia was provided with 1% Lidocaine. The left flank was interrogated with ultrasound and the left kidney identified. The kidney is hydronephrotic. A suitable access site on the skin overlying the lower pole, posterior calix was identified. After local mg anesthesia was achieved, a small skin nick was made with an 11 blade scalpel. A 21 gauge Accustick needle was then advanced under direct sonographic guidance into the lower pole of the left kidney. A 0.018 inch wire was advanced under fluoroscopic guidance into the left renal collecting system. The Accustick sheath was then advanced over the wire and a 0.018 system exchanged for a 0.035 system. Gentle hand injection of contrast material confirms placement of the sheath within the renal collecting system. There is hydronephrosis. The tract from the scan into the renal collecting system was then dilated serially to 10-French. A  10-French Cook all-purpose drain was then placed and positioned under fluoroscopic guidance. The locking loop is well formed within the left renal pelvis. The catheter was secured to the skin with 2-0 Prolene and a sterile bandage was placed. Catheter was left to gravity bag drainage. RIGHT PCN The right flank was prepped with chlorhexidine in a sterile fashion, and a sterile drape was applied covering the operative field. A sterile gown and sterile gloves were used for the procedure. Local anesthesia was provided with 1% Lidocaine. The right flank was interrogated with ultrasound and the left kidney identified. The kidney is hydronephrotic. A suitable access site on the skin overlying the lower pole, posterior calix was identified. After local mg anesthesia was achieved, a small skin nick was made with an 11 blade scalpel. A 21 gauge Accustick needle was then advanced under direct  sonographic guidance into the lower pole of the right kidney. A 0.018 inch wire was advanced under fluoroscopic guidance into the left renal collecting system. The Accustick sheath was then advanced over the wire and a 0.018 system exchanged for a 0.035 system. Gentle hand injection of contrast material confirms placement of the sheath within the renal collecting system. There is marked hydronephrosis. The tract from the scan into the renal collecting system was then dilated serially to 10-French. A 10-French Cook all-purpose drain was then placed and positioned under fluoroscopic guidance. The locking loop is well formed within the left renal pelvis. The catheter was secured to the skin with 2-0 Prolene and a sterile bandage was placed. Catheter was left to gravity bag drainage. IMPRESSION: Successful placement of a bilateral 10 French percutaneous nephrostomy tubes. Signed, Criselda Peaches, MD [facility] Radiology Electronically Signed   By: Jacqulynn Cadet M.D.   On: 10/01/2017 17:03   Ir Nephrostomy Placement Right  Result Date: 10/01/2017 INDICATION: 72 year old male with bilateral hydronephrosis in the setting of prostate cancer. EXAM: IR NEPHROSTOMY PLACEMENT RIGHT; IR NEPHROSTOMY PLACEMENT LEFT COMPARISON:  CT abdomen/pelvis 09/30/2017 MEDICATIONS: 1 g Rocephin; The antibiotic was administered in an appropriate time frame prior to skin puncture. ANESTHESIA/SEDATION: Fentanyl 1 mcg IV; Versed 75 mg IV Moderate Sedation Time:  23 minutes The patient was continuously monitored during the procedure by the interventional radiology nurse under my direct supervision. CONTRAST:  54mL ISOVUE-300 IOPAMIDOL (ISOVUE-300) INJECTION 61%, 68mL ISOVUE-300 IOPAMIDOL (ISOVUE-300) INJECTION 61% - administered into the collecting system(s) FLUOROSCOPY TIME:  Fluoroscopy Time: 2 minutes 24 seconds (11 mGy). COMPLICATIONS: None immediate. TECHNIQUE: The procedure, risks,  benefits, and alternatives were explained to the patient. Questions regarding the procedure were encouraged and answered. The patient understands and consents to the procedure. LEFT PCN The left flank was prepped with chlorhexidine in a sterile fashion, and a sterile drape was applied covering the operative field. A sterile gown and sterile gloves were used for the procedure. Local anesthesia was provided with 1% Lidocaine. The left flank was interrogated with ultrasound and the left kidney identified. The kidney is hydronephrotic. A suitable access site on the skin overlying the lower pole, posterior calix was identified. After local mg anesthesia was achieved, a small skin nick was made with an 11 blade scalpel. A 21 gauge Accustick needle was then advanced under direct sonographic guidance into the lower pole of the left kidney. A 0.018 inch wire was advanced under fluoroscopic guidance into the left renal collecting system. The Accustick sheath was then advanced over the wire and a 0.018 system exchanged for a 0.035 system. Gentle  hand injection of contrast material confirms placement of the sheath within the renal collecting system. There is hydronephrosis. The tract from the scan into the renal collecting system was then dilated serially to 10-French. A 10-French Cook all-purpose drain was then placed and positioned under fluoroscopic guidance. The locking loop is well formed within the left renal pelvis. The catheter was secured to the skin with 2-0 Prolene and a sterile bandage was placed. Catheter was left to gravity bag drainage. RIGHT PCN The right flank was prepped with chlorhexidine in a sterile fashion, and a sterile drape was applied covering the operative field. A sterile gown and sterile gloves were used for the procedure. Local anesthesia was provided with 1% Lidocaine. The right flank was interrogated with ultrasound and the left kidney identified. The kidney is hydronephrotic. A suitable access  site on the skin overlying the lower pole, posterior calix was identified. After local mg anesthesia was achieved, a small skin nick was made with an 11 blade scalpel. A 21 gauge Accustick needle was then advanced under direct sonographic guidance into the lower pole of the right kidney. A 0.018 inch wire was advanced under fluoroscopic guidance into the left renal collecting system. The Accustick sheath was then advanced over the wire and a 0.018 system exchanged for a 0.035 system. Gentle hand injection of contrast material confirms placement of the sheath within the renal collecting system. There is marked hydronephrosis. The tract from the scan into the renal collecting system was then dilated serially to 10-French. A 10-French Cook all-purpose drain was then placed and positioned under fluoroscopic guidance. The locking loop is well formed within the left renal pelvis. The catheter was secured to the skin with 2-0 Prolene and a sterile bandage was placed. Catheter was left to gravity bag drainage. IMPRESSION: Successful placement of a bilateral 10 French percutaneous nephrostomy tubes. Signed, Criselda Peaches, MD [facility] Radiology Electronically Signed   By: Jacqulynn Cadet M.D.   On: 10/01/2017 17:03        Scheduled Meds: . amLODipine  10 mg Oral Daily  . cholecalciferol  2,000 Units Oral Daily  . feeding supplement (ENSURE ENLIVE)  237 mL Oral BID  . fentaNYL      . finasteride  5 mg Oral Daily  . folic acid  1 mg Oral Daily  . heparin  5,000 Units Subcutaneous Q8H  . insulin aspart  0-5 Units Subcutaneous QHS  . insulin aspart  0-9 Units Subcutaneous TID WC  . levothyroxine  100 mcg Oral QAC breakfast  . megestrol  400 mg Oral Daily  . metoprolol succinate  100 mg Oral Daily  . midazolam      . multivitamin with minerals  1 tablet Oral Daily  . senna  1 tablet Oral BID  . sodium chloride flush  3 mL Intravenous Q12H  . sodium  chloride flush  5 mL Intracatheter Q8H  . tamsulosin  0.4 mg Oral Daily  . thiamine  100 mg Oral Daily   Continuous Infusions: . sodium chloride    . cefTAZidime (FORTAZ)  IV 1 g (10/03/17 1437)  . dextrose 5 % and 0.45% NaCl 100 mL/hr at 10/02/17 2130  . magnesium sulfate 1 - 4 g bolus IVPB    . potassium chloride Stopped (10/03/17 1242)     LOS: 3 days    Time spent: 35 mins.      Shelly Coss, MD [facility] Pager [phone]  If 7PM-7AM, please contact  night-coverage [email or web address] Password [facility] 10/03/2017, 2:38 PM
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
## 2017-10-03 NOTE — Care Management Important Message (Signed)
Important Message  Patient Details  Name: KENNET MCCORT MRN: [id number] Date of Birth: 08/25/1945   Medicare Important Message Given:  Yes    Kerin Salen 10/03/2017, 11:17 AMImportant Message  Patient Details  Name: RICCI DIROCCO MRN: [id number] Date of Birth: 1946-06-06   Medicare Important Message Given:  Yes    Kerin Salen 10/03/2017, 11:17 AM

## 2017-10-04 LAB — CBC WITH DIFFERENTIAL/PLATELET
BASOS PCT: 0 %
Basophils Absolute: 0 10*3/uL (ref 0.0–0.1)
EOS ABS: 0.2 10*3/uL (ref 0.0–0.7)
Eosinophils Relative: 2 %
HCT: 31.7 % — ABNORMAL LOW (ref 39.0–52.0)
HEMOGLOBIN: 10.6 g/dL — AB (ref 13.0–17.0)
Lymphocytes Relative: 18 %
Lymphs Abs: 1.9 10*3/uL (ref 0.7–4.0)
MCH: 29.1 pg (ref 26.0–34.0)
MCHC: 33.4 g/dL (ref 30.0–36.0)
MCV: 87.1 fL (ref 78.0–100.0)
MONOS PCT: 5 %
Monocytes Absolute: 0.5 10*3/uL (ref 0.1–1.0)
NEUTROS PCT: 75 %
Neutro Abs: 7.8 10*3/uL — ABNORMAL HIGH (ref 1.7–7.7)
PLATELETS: 326 10*3/uL (ref 150–400)
RBC: 3.64 MIL/uL — ABNORMAL LOW (ref 4.22–5.81)
RDW: 12.7 % (ref 11.5–15.5)
WBC: 10.4 10*3/uL (ref 4.0–10.5)

## 2017-10-04 LAB — GLUCOSE, CAPILLARY
GLUCOSE-CAPILLARY: 98 mg/dL (ref 65–99)
Glucose-Capillary: 99 mg/dL (ref 65–99)
Glucose-Capillary: 186 mg/dL — ABNORMAL HIGH (ref 65–99)
Glucose-Capillary: 77 mg/dL (ref 65–99)

## 2017-10-04 LAB — BASIC METABOLIC PANEL
Anion gap: 8 (ref 5–15)
BUN: 7 mg/dL (ref 6–20)
CALCIUM: 9.1 mg/dL (ref 8.9–10.3)
CHLORIDE: 109 mmol/L (ref 101–111)
CO2: 21 mmol/L — ABNORMAL LOW (ref 22–32)
CREATININE: 1.02 mg/dL (ref 0.61–1.24)
Glucose, Bld: 108 mg/dL — ABNORMAL HIGH (ref 65–99)
Potassium: 2.6 mmol/L — CL (ref 3.5–5.1)
SODIUM: 138 mmol/L (ref 135–145)

## 2017-10-04 LAB — MAGNESIUM: MAGNESIUM: 1.4 mg/dL — AB (ref 1.7–2.4)

## 2017-10-04 MED ORDER — POTASSIUM CHLORIDE 10 MEQ/100ML IV SOLN
INTRAVENOUS | Status: AC
  Administered 2017-10-04: 10 meq
  Filled 2017-10-04: qty 100

## 2017-10-04 MED ORDER — POTASSIUM CHLORIDE 10 MEQ/100ML IV SOLN
INTRAVENOUS | Status: AC
  Administered 2017-10-05: 10 meq
  Filled 2017-10-04: qty 100

## 2017-10-04 MED ORDER — POTASSIUM CHLORIDE 10 MEQ/100ML IV SOLN
10.0000 meq | Freq: Once | INTRAVENOUS | Status: AC
  Administered 2017-10-03: 10 meq via INTRAVENOUS

## 2017-10-04 MED ORDER — CIPROFLOXACIN HCL 500 MG PO TABS: 500.0000 mg | ORAL_TABLET | Freq: Two times a day (BID) | ORAL | Status: DC

## 2017-10-04 MED ORDER — POTASSIUM CHLORIDE CRYS ER 10 MEQ PO TBCR: 40.0000 meq | EXTENDED_RELEASE_TABLET | Freq: Once | ORAL | Status: DC

## 2017-10-04 MED ORDER — MAGNESIUM SULFATE 2 GM/50ML IV SOLN
2.0000 g | Freq: Once | INTRAVENOUS | Status: AC
  Administered 2017-10-04: 2 g via INTRAVENOUS
  Filled 2017-10-04: qty 50

## 2017-10-04 MED ORDER — POTASSIUM CHLORIDE 10 MEQ/100ML IV SOLN
10.0000 meq | INTRAVENOUS | Status: AC
  Administered 2017-10-04 (×4): 10 meq via INTRAVENOUS
  Filled 2017-10-04 (×3): qty 100

## 2017-10-04 MED ORDER — POTASSIUM CHLORIDE 10 MEQ/100ML IV SOLN
INTRAVENOUS | Status: AC
Start: 2017-10-04 — End: 2017-10-05
  Filled 2017-10-04: qty 100

## 2017-10-04 MED ORDER — CIPROFLOXACIN IN D5W 400 MG/200ML IV SOLN
400.0000 mg | Freq: Two times a day (BID) | INTRAVENOUS | Status: AC
  Administered 2017-10-04 – 2017-10-06 (×4): 400 mg via INTRAVENOUS
  Filled 2017-10-04 (×4): qty 200

## 2017-10-04 NOTE — Progress Notes (Signed)
Subjective: No complaints this morning.   Objective: Vital signs in last 24 hours: Temp:  [98.1 F (36.7 C)-98.9 F (37.2 C)] 98.1 F (36.7 C) (04/20 0746) Pulse Rate:  [72-77] 72 (04/20 0746) Resp:  [10-20] 19 (04/20 0441) BP: (101-137)/(60-75) 137/75 (04/20 0746) SpO2:  [96 %-100 %] 100 % (04/20 0746)  Intake/Output from previous day: 04/19 0701 - 04/20 0700 In: 140 [P.O.:120] Out: 1600 [Urine:1600]  Intake/Output this shift: Total I/O In: -  Out: 542 [Urine:542]  Physical Exam:  General: Alert and oriented CV: RRR, palpable distal pulses Lungs: CTAB, equal chest rise Abdomen: Soft, NTND, no rebound or guarding Gu: Bilateral nephrostomy tubes in place and draining clear-yellow urine.  SPT in place and draining clear-yellow urine.  No urine noted within his colostomy bag.  Ext: NT, No erythema  Lab Results: Recent Labs    10/02/17 1028 10/03/17 0606 10/04/17 0953  HGB 9.6* 10.6* 10.6*  HCT 29.9* 32.3* 31.7*   BMET Recent Labs    10/02/17 1028 10/03/17 0606 10/03/17 1959  NA 148* 145  --   K 3.2* 2.7* 3.2*  CL 123* 117*  --   CO2 17* 19*  --   GLUCOSE 135* 173*  --   BUN 32* 17  --   CREATININE 1.60* 1.23  --   CALCIUM 10.2 9.9  --      Studies/Results: Ct Biopsy  Result Date: 10/03/2017 CLINICAL DATA:  Prostate carcinoma. Expansile lytic lesion involving the inferior left pubic ramus EXAM: CT GUIDED CORE BIOPSY OF LEFT PUBIC BONE LESION ANESTHESIA/SEDATION: Intravenous Fentanyl and Versed were administered as conscious sedation during continuous monitoring of the patient's level of consciousness and physiological / cardiorespiratory status by the radiology RN, with a total moderate sedation time of 39 minutes. PROCEDURE: The procedure risks, benefits, and alternatives were explained to the patient. Questions regarding the procedure were encouraged and answered. The patient understands and consents to the procedure. Select axial scans through the pelvis  were obtained. The lesion was localized and an appropriate skin entry site was determined and marked. The operative field was prepped with chlorhexidinein a sterile fashion, and a sterile drape was applied covering the operative field. A sterile gown and sterile gloves were used for the procedure. Local anesthesia was provided with 1% Lidocaine. Under CT fluoroscopic guidance, a 17 gauge trocar needle was advanced to the margin of the lesion. Once needle tip position was confirmed, coaxial 18-gauge core biopsy samples were obtained, submitted in formalin to surgical pathology. The guide needle was removed. Postprocedure scans show no hemorrhage or other apparent complication. The patient tolerated the procedure well. COMPLICATIONS: None immediate FINDINGS: Lytic soft tissue mass involving inferior left pubic ramus was again localized. Representative core biopsy samples obtained as above. IMPRESSION: 1. Technically successful CT-guided core biopsy, left pubic bone lytic lesion. Electronically Signed   By: Lucrezia Europe M.D.   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  -ARF improving s/p bilateral PCNs. There does not appear to be urine draining from his colostomy on exam today.  Recommend keeping the PCNs in place until he can f/u with [name] at [facility] -s/p left pubic bone biopsy--path pending -Currently on ceftazidime for a Pseudomonal/Klebsiella UTI--plans pending to convert to PO cipro -  Please call with any questions or concerns. Will follow PRN   Ellison Hughs, MD [facility] Pager: [phone]  10/04/2017, 10:35 AM

## 2017-10-04 NOTE — Progress Notes (Signed)
Patient refused morning medications.  "I just want to be left alone".  MD notified via text page.

## 2017-10-04 NOTE — Progress Notes (Signed)
CRITICAL VALUE ALERT  Critical Value:  K+ 2.6  Date & Time Notied:  10/04/17 10:50  Provider Notified:  Yes via text page  Orders Received/Actions taken:

## 2017-10-04 NOTE — Progress Notes (Signed)
Patient refusing PO medications.  MD notified via text page.

## 2017-10-04 NOTE — Progress Notes (Signed)
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 He underwent CT imagings which showed tumor involving the  prostate and adjacent  soft tissues with destruction of left pubis.  Recurrence of the prostate cancer has been suspected but it could also be a secondary malignancy or a ureteral carcinoma.  Patient underwent biopsy of the tumor ar the left pubis on 10/03/17.   Assessment & Plan:   Principal Problem:   Hypercalcemia Active Problems:   ADENOCARCINOMA, PROSTATE with Liver Mets   Essential hypertension   AKI (acute kidney injury) ([place])   Anemia   Suprapubic catheter in place   Acute bilateral obstructive uropathy/Bil hydroureter and Bil hydronephrosis   Encounter for palliative care   Goals of care, counseling/discussion   Protein-calorie malnutrition, severe  Metastatic Cancer:  Hx of prostate cancer stage T1c gleason 3+3 s/p brachytherapy in 2005.  Concern for recurrent prostate cancer on imaging, but normal PSA.  Pathology from recent liver biopsy on 3/28 (care everywhere) suspicious for "possible metastatic prostatic adenocarcinoma, but not definitive."  Per oncology, concerning for undifferentiated prostate cancer vs second primary malignancy.    Patient underwent biopsy today.We will follow up biopsy report.  Acute kidney injury: Resolved. Initially  presented with acute kidney injury due to obstruction and dehydration.  Patient was also on ACE inhibitors, naproxen and hydrochlorthiazide at home. Presented with creatinine of 3.5 .  Baseline creatinine 1. Acute kidney injury resolved with percutaneous nephrostomy tube placement.   Status post bilateral kidneys nephrostomy tube placement on 4/17  Obstructive Uropathy with AKI and Bilateral Hydronephrosis and Hydroureter:  S/p percutaneous nephrostomy tubes on 4/17 by IR Seen by [name] on 4/16 who recommended bilateral nephrostomy tube placement for decompression (of note, pt with hx of obliterated urethra and unable to place ureteral stents.  Pt with suprapubic catheter. Pt sees urologist, [name] at [place], flomax  Hypokalemia/hypomagnesemia: Being supplemented.We will check levels tomorrow.  Hypernatremia: Improved.  Obliterated urethra with rectourethral/rectovesical fisula: Suprapubic catheter and s/p bowel diversion.   [name] noted clear yellow drainage in colostomy bag concerning for possible new fistula.S/P  diversion with nephrostomy tubes .  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  Dietician c/s.  Poor PO intake over past few weeks.     Acute on chronic Anemia- hemoglobin isdown to 9.6 from 12.2 on admission due to hemodilution, suspect chronic anemia related to underlying malignancy.  We will continue to monitor H&H  HTN-Held lisinopril and HCTZ due to AKIas above #1,continue amlodipine 10 mg daily,metoprolol 100 mg daily.BP stable  UTI: Urine culture grew Pseudomonas and Klebsiella, both pan sensitive .  Was on ceftazidime .  We will change to ciprofloxacin today.  Deconditioning/debility: Will request for physical therapy evaluation today.   DVT prophylaxis: Heparin [place] Code Status: Full Family Communication:  None present at the bedside Disposition Plan:  Pending PT evaluation and biopsy report   Consultants: Urology, oncology  Procedures: Bilateral percutaneous nephrostomy tubes on 4/17 Biopsy left pubis on 10/03/17 on 10/03/17  Antimicrobials: Ceftazidime since 10/02/2017-10/04/17 Cipro: Since 10/04/17  Subjective: Patient seen and examined the bedside this morning.   Continues to remain weak.  No other active issues  Objective: Vitals:   10/03/17 1427 10/03/17 2159 10/04/17 0441 10/04/17 0746  BP: 132/60 130/75 118/68 137/75  Pulse: 76 74 76 72  Resp: 16 20 19    Temp: 98.2 F (36.8 C) 98.1 F (36.7 C) 98.9 F (37.2 C) 98.1 F (36.7 C)  TempSrc: Oral Oral Oral Oral  SpO2: 99% 100% 96% 100%  Weight:      Height:        Intake/Output Summary (Last 24 hours) at 10/04/2017 1103 Last data filed at 10/04/2017 0842 Gross per 24 hour  Intake 140 ml  Output 1592 ml  Net -1452 ml   Filed Weights   09/30/17 1856  Weight: 49.9 kg (110 lb)    Examination:  General exam: Appears calm and comfortable , cachectic, chronically ill  HEENT:PERRL,Oral mucosa moist, Ear/Nose normal on gross exam Respiratory system: Bilateral equal air entry, normal vesicular breath sounds, no wheezes or crackles  Cardiovascular system: S1 & S2 heard, RRR. No JVD, murmurs, rubs, gallops or clicks. Gastrointestinal system: Abdomen is nondistended, soft and nontender. No organomegaly or masses felt. Normal bowel sounds heard.  Colostomy bag, bilateral percutaneous nephrostomy tubes, suprapubic catheter Central nervous system: Alert and oriented. No focal neurological deficits. Extremities: No edema, no clubbing ,no cyanosis, distal peripheral pulses palpable. Skin: No rashes, lesions or ulcers,no icterus ,no pallor    Data Reviewed: I have personally reviewed following labs and imaging studies  CBC: Recent Labs  Lab 09/30/17 1218 10/01/17 0520 10/02/17 1028 10/03/17 0606 10/04/17 0953  WBC 10.2 9.9  10.5 11.3* 10.4  NEUTROABS  --   --  8.3*  --  7.8*  HGB 12.2* 9.7* 9.6* 10.6* 10.6*  HCT 37.2* 29.8* 29.9* 32.3* 31.7*  MCV 89.4 89.2 90.6 89.2 87.1  PLT 374 325 308 323 536   Basic Metabolic Panel: Recent Labs  Lab 09/30/17 1218 10/01/17 0520 10/02/17 1028 10/03/17 0606 10/03/17 1959 10/04/17 0953  NA 142 143 148* 145  --  138  K 3.4* 3.7 3.2* 2.7* 3.2* 2.6*  CL 110 117* 123* 117*  --  109  CO2 19* 13* 17* 19*  --  21*  GLUCOSE 100* 132* 135* 173*  --  108*  BUN 73* 62* 32* 17  --  7  CREATININE 3.53* 3.26* 1.60* 1.23  --  1.02  CALCIUM 15.0* 11.9* 10.2 9.9  --  9.1  MG  --   --  1.5* 1.6*  --  1.4*   GFR: Estimated Creatinine Clearance: 45.5 mL/min (by C-G formula based on SCr of 1.02 mg/dL). Liver Function Tests: Recent Labs  Lab 09/30/17 1218 10/01/17 0520 10/02/17 1028 10/03/17 0606  AST 11* 11*  --  15  ALT 10* 8*  --  9*  ALKPHOS 84 60  --  64  BILITOT 0.4 0.5  --  0.3  PROT 8.4* 6.3*  --  6.9  ALBUMIN 3.2* 2.3* 2.4* 2.5*   Recent Labs  Lab 09/30/17 1218  LIPASE 18   No results for input(s): AMMONIA in the last 168 hours. Coagulation Profile: Recent Labs  Lab 10/01/17 0934  INR 1.23   Cardiac Enzymes: No results for input(s): CKTOTAL, CKMB, CKMBINDEX, TROPONINI  in the last 168 hours. BNP (last 3 results) No results for input(s): PROBNP in the last 8760 hours. HbA1C: No results for input(s): HGBA1C in the last 72 hours. CBG: Recent Labs  Lab 10/14/2017 0745 10-14-2017 1145 10/14/17 1716 10/14/17 2142 10/04/17 0758  GLUCAP 155* 134* 115* 131* 98   Lipid Profile: No results for input(s): CHOL, HDL, LDLCALC, TRIG, CHOLHDL, LDLDIRECT in the last 72 hours. Thyroid Function Tests: No results for input(s): TSH, T4TOTAL, FREET4, T3FREE, THYROIDAB in the last 72 hours. Anemia Panel: No results for input(s): VITAMINB12, FOLATE, FERRITIN, TIBC, IRON, RETICCTPCT in the last 72 hours. Sepsis Labs: No results for input(s): PROCALCITON, LATICACIDVEN in  the last 168 hours.  Recent Results (from the past 240 hour(s))  Urine Culture     Status: Abnormal   Collection Time: 09/30/17  3:48 PM  Result Value Ref Range Status   Specimen Description   Final    URINE, SUPRAPUBIC Performed at [facility] [address], [place], [place] [address]    Special Requests   Final    Immunocompromised Performed at [facility], [place] [address], [place], [place] [address]    Culture (A)  Final    >=100,000 COLONIES/mL PSEUDOMONAS AERUGINOSA >=100,000 COLONIES/mL KLEBSIELLA PNEUMONIAE    Report Status Oct 14, 2017 FINAL  Final   Organism ID, Bacteria PSEUDOMONAS AERUGINOSA (A)  Final   Organism ID, Bacteria KLEBSIELLA PNEUMONIAE (A)  Final      Susceptibility   Klebsiella pneumoniae - MIC*    AMPICILLIN >=32 RESISTANT Resistant     CEFAZOLIN <=4 SENSITIVE Sensitive     CEFTRIAXONE <=1 SENSITIVE Sensitive     CIPROFLOXACIN <=0.25 SENSITIVE Sensitive     GENTAMICIN <=1 SENSITIVE Sensitive     IMIPENEM <=0.25 SENSITIVE Sensitive     NITROFURANTOIN 32 SENSITIVE Sensitive     TRIMETH/SULFA <=20 SENSITIVE Sensitive     AMPICILLIN/SULBACTAM 4 SENSITIVE Sensitive     PIP/TAZO <=4 SENSITIVE Sensitive     Extended ESBL NEGATIVE Sensitive     * >=100,000 COLONIES/mL KLEBSIELLA PNEUMONIAE   Pseudomonas aeruginosa - MIC*    CEFTAZIDIME 4 SENSITIVE Sensitive     CIPROFLOXACIN <=0.25 SENSITIVE Sensitive     GENTAMICIN <=1 SENSITIVE Sensitive     IMIPENEM 1 SENSITIVE Sensitive     PIP/TAZO 8 SENSITIVE Sensitive     CEFEPIME 2 SENSITIVE Sensitive     * >=100,000 COLONIES/mL PSEUDOMONAS AERUGINOSA         Radiology Studies: Ct Biopsy  Result Date: 10/14/2017 CLINICAL DATA:  Prostate carcinoma. Expansile lytic lesion involving the inferior left pubic ramus EXAM: CT GUIDED CORE BIOPSY OF LEFT PUBIC BONE LESION ANESTHESIA/SEDATION: Intravenous Fentanyl and Versed were administered as conscious sedation during  continuous monitoring of the patient's level of consciousness and physiological / cardiorespiratory status by the radiology RN, with a total moderate sedation time of 39 minutes. PROCEDURE: The procedure risks, benefits, and alternatives were explained to the patient. Questions regarding the procedure were encouraged and answered. The patient understands and consents to the procedure. Select axial scans through the pelvis were obtained. The lesion was localized and an appropriate skin entry site was determined and marked. The operative field was prepped with chlorhexidinein a sterile fashion, and a sterile drape was applied covering the operative field. A sterile gown and sterile gloves were used for the procedure. Local anesthesia was provided with 1% Lidocaine. Under CT fluoroscopic guidance, a 17 gauge trocar needle was advanced to the margin of the lesion.  Once needle tip position was confirmed, coaxial 18-gauge core biopsy samples were obtained, submitted in formalin to surgical pathology. The guide needle was removed. Postprocedure scans show no hemorrhage or other apparent complication. The patient tolerated the procedure well. COMPLICATIONS: None immediate FINDINGS: Lytic soft tissue mass involving inferior left pubic ramus was again localized. Representative core biopsy samples obtained as above. IMPRESSION: 1. Technically successful CT-guided core biopsy, left pubic bone lytic lesion. Electronically Signed   By: Lucrezia Europe M.D.   On: 10/03/2017 15:07        Scheduled Meds: . amLODipine  10 mg Oral Daily  . cholecalciferol  2,000 Units Oral Daily  . ciprofloxacin  500 mg Oral BID  . feeding supplement (ENSURE ENLIVE)  237 mL Oral BID  . finasteride  5 mg Oral Daily  . folic acid  1 mg Oral Daily  . heparin  5,000 Units Subcutaneous Q8H  . insulin aspart  0-5 Units Subcutaneous QHS  . insulin aspart  0-9 Units Subcutaneous TID WC  . levothyroxine  100 mcg Oral QAC breakfast  . megestrol   400 mg Oral Daily  . metoprolol succinate  100 mg Oral Daily  . multivitamin with minerals  1 tablet Oral Daily  . potassium chloride  40 mEq Oral Once  . senna  1 tablet Oral BID  . sodium chloride flush  3 mL Intravenous Q12H  . sodium chloride flush  5 mL Intracatheter Q8H  . tamsulosin  0.4 mg Oral Daily  . thiamine  100 mg Oral Daily   Continuous Infusions: . sodium chloride    . dextrose 5 % and 0.45% NaCl 75 mL/hr at 10/03/17 1443  . magnesium sulfate 1 - 4 g bolus IVPB    . potassium chloride       LOS: 4 days    Time spent: 25 mins.      Shelly Coss, MD [facility] Pager [phone]  If 7PM-7AM, please contact night-coverage [email or web address] Password [email or web address] 10/04/2017, 11:03 AM

## 2017-10-05 LAB — GLUCOSE, CAPILLARY
Glucose-Capillary: 97 mg/dL (ref 65–99)
Glucose-Capillary: 117 mg/dL — ABNORMAL HIGH (ref 65–99)
Glucose-Capillary: 142 mg/dL — ABNORMAL HIGH (ref 65–99)
Glucose-Capillary: 79 mg/dL (ref 65–99)

## 2017-10-05 LAB — BASIC METABOLIC PANEL
Anion gap: 6 (ref 5–15)
BUN: 5 mg/dL — AB (ref 6–20)
CALCIUM: 8 mg/dL — AB (ref 8.9–10.3)
CHLORIDE: 108 mmol/L (ref 101–111)
CO2: 21 mmol/L — AB (ref 22–32)
CREATININE: 1 mg/dL (ref 0.61–1.24)
GFR calc Af Amer: >60 mL/min (ref >60)
GFR calc non Af Amer: >60 mL/min (ref >60)
GLUCOSE: 101 mg/dL — AB (ref 65–99)
Potassium: 3.1 mmol/L — ABNORMAL LOW (ref 3.5–5.1)
Sodium: 135 mmol/L (ref 135–145)

## 2017-10-05 LAB — MAGNESIUM: Magnesium: 1.4 mg/dL — ABNORMAL LOW (ref 1.7–2.4)

## 2017-10-05 MED ORDER — MAGNESIUM OXIDE 400 (241.3 MG) MG PO TABS: 400.0000 mg | ORAL_TABLET | Freq: Every day | ORAL | Status: DC

## 2017-10-05 MED ORDER — POTASSIUM CHLORIDE CRYS ER 10 MEQ PO TBCR
20.0000 meq | EXTENDED_RELEASE_TABLET | Freq: Every day | ORAL | Status: DC
  Administered 2017-10-06: 20 meq via ORAL
  Filled 2017-10-05: qty 2

## 2017-10-05 MED ORDER — POTASSIUM CHLORIDE 10 MEQ/100ML IV SOLN
10.0000 meq | INTRAVENOUS | Status: AC
  Administered 2017-10-05 (×3): 10 meq via INTRAVENOUS
  Filled 2017-10-05 (×5): qty 100

## 2017-10-05 MED ORDER — POTASSIUM CHLORIDE 10 MEQ/100ML IV SOLN
10.0000 meq | INTRAVENOUS | Status: AC
  Administered 2017-10-05 (×2): 10 meq via INTRAVENOUS

## 2017-10-05 MED ORDER — MAGNESIUM SULFATE 2 GM/50ML IV SOLN
2.0000 g | Freq: Once | INTRAVENOUS | Status: AC
  Administered 2017-10-05: 2 g via INTRAVENOUS
  Filled 2017-10-05: qty 50

## 2017-10-05 NOTE — Progress Notes (Signed)
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 He underwent CT imagings which showed tumor involving the  prostate and adjacent  soft tissues with destruction of left pubis.  Recurrence of the prostate cancer has been suspected but it could also be a secondary malignancy or a ureteral carcinoma.  Patient underwent biopsy of the tumor ar the left pubis on 10/03/17.Waiting for biopsy report and further input from oncology.   Assessment & Plan:   Principal Problem:   Hypercalcemia Active Problems:   ADENOCARCINOMA, PROSTATE with Liver Mets   Essential hypertension   AKI (acute kidney injury) ([place])   Anemia   Suprapubic catheter in place   Acute bilateral obstructive uropathy/Bil hydroureter and Bil hydronephrosis   Encounter for palliative care   Goals of care, counseling/discussion   Protein-calorie malnutrition, severe  Metastatic Cancer:  Hx of prostate cancer stage T1c gleason 3+3 s/p brachytherapy in 2005.  Concern for recurrent prostate cancer on imaging, but normal PSA.  Pathology from recent liver biopsy on 3/28 (care everywhere) suspicious for "possible metastatic prostatic adenocarcinoma, but not definitive."  Per oncology, concerning for undifferentiated prostate cancer vs second primary malignancy.    Patient underwent biopsy of the tumor ar the left  pubis on 10/03/17.We will follow up biopsy report.  Acute kidney injury: Resolved. Initially presented with acute kidney injury due to obstruction and dehydration.  Patient was also on ACE inhibitors, naproxen and hydrochlorthiazide at home. Presented with creatinine of 3.5 .  Baseline creatinine 1. Acute kidney injury resolved with percutaneous nephrostomy tube placement.   Status post bilateral kidneys nephrostomy tube placement on 4/17  Obstructive Uropathy with AKI and Bilateral Hydronephrosis and Hydroureter:  S/p percutaneous nephrostomy tubes on 4/17 by IR Seen by [name] on 4/16 who recommended bilateral nephrostomy tube placement for decompression (of note, pt with hx of obliterated urethra and unable to place ureteral stents.  Pt with suprapubic catheter. Pt sees urologist, [name] at [place], flomax  Hypokalemia/hypomagnesemia: Being supplemented.We will check levels tomorrow.  Hypernatremia: Improved.  Obliterated urethra with rectourethral/rectovesical fisula: Suprapubic catheter and s/p bowel diversion.   [name] noted clear yellow drainage in colostomy bag concerning for possible new fistula.S/P  diversion with nephrostomy tubes .  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  Dietician c/s.  Poor PO intake over past few weeks.     Acute on chronic Anemia- hemoglobin isdown to 9.6 from 12.2 on admission due to hemodilution, suspect chronic anemia related to underlying malignancy.  We will continue to monitor H&H  HTN-Held lisinopril and HCTZ due to AKI.Continue amlodipine 10 mg daily,metoprolol 100 mg daily.BP stable  UTI: Urine culture grew Pseudomonas and Klebsiella, both pan sensitive .  Was on ceftazidime .  Changed to ciprofloxacin.He will finish antibiotic  course tomorrow.  Deconditioning/debility: PT following  DVT prophylaxis: Heparin [place] Code Status: Full Family Communication: None present at the bedside Disposition Plan: Pending PT evaluation and biopsy report,oncolgy input   Consultants: Urology, oncology  Procedures: Bilateral percutaneous nephrostomy tubes on 4/17 Biopsy left pubis on 10/03/17  Antimicrobials: Ceftazidime from  10/02/2017 thorugh 10/04/17 Cipro: Since 10/04/17  Subjective: Patient seen and examined the bedside this morning.  Looks comfortable.  Continues to have poor oral intake.  Denies any oral medications or food. I encouraged him for oral intake.  Objective: Vitals:   10/04/17 1357 10/04/17 1958 10/05/17 0515 10/05/17 1427  BP: (!) 120/59 (!) 144/77 135/81 129/73  Pulse: 90 79 79 87  Resp: 20 18 17 18   Temp: 98.4 F (36.9 C) 99.2 F (37.3 C) 98.6 F (37 C) 98.6 F (37 C)  TempSrc: Oral Oral Oral Oral  SpO2: 100% 100% 100% 100%  Weight:      Height:        Intake/Output Summary (Last 24 hours) at 10/05/2017 1458 Last data filed at 10/05/2017 1300 Gross per 24 hour  Intake 1720 ml  Output 1851 ml  Net -131 ml   Filed Weights   09/30/17 1856  Weight: 49.9 kg (110 lb)    Examination:  General exam: Appears calm and comfortable, Cachetic, chronically ill HEENT:PERRL,Oral mucosa moist, Ear/Nose normal on gross exam Respiratory system: Bilateral equal air entry, normal vesicular breath sounds, no wheezes or crackles  Cardiovascular system: S1 & S2 heard, RRR. No JVD, murmurs, rubs, gallops or clicks. Gastrointestinal system: Abdomen is nondistended, soft and nontender. No organomegaly or masses felt. Normal bowel sounds heard. Colostomy bag, bilateral percutaneous nephrostomy tubes, suprapubic catheter Central nervous system: Alert and oriented. No focal neurological deficits. Extremities: No edema, no clubbing ,no cyanosis, distal peripheral pulses palpable. Skin: No rashes, lesions or ulcers,no  icterus ,no pallor Psychiatry: Judgement and insight appear normal. Mood & affect appropriate.   Data Reviewed: I have personally reviewed following labs and imaging studies  CBC: Recent Labs  Lab 09/30/17 1218 10/01/17 0520 10/02/17 1028 10/03/17 0606 10/04/17 0953  WBC 10.2 9.9 10.5 11.3* 10.4  NEUTROABS  --   --  8.3*  --  7.8*  HGB 12.2* 9.7* 9.6* 10.6* 10.6*  HCT 37.2* 29.8* 29.9* 32.3* 31.7*  MCV 89.4 89.2 90.6 89.2 87.1  PLT 374 325 308 323 440   Basic Metabolic Panel: Recent Labs  Lab 10/01/17 0520 10/02/17 1028 10/03/17 0606 10/03/17 1959 10/04/17 0953 10/05/17 0548  NA 143 148* 145  --  138 135  K 3.7 3.2* 2.7* 3.2* 2.6* 3.1*  CL 117* 123* 117*  --  109 108  CO2 13* 17* 19*  --  21* 21*  GLUCOSE 132* 135* 173*  --  108* 101*  BUN 62* 32* 17  --  7 5*  CREATININE 3.26* 1.60* 1.23  --  1.02 1.00  CALCIUM 11.9* 10.2 9.9  --  9.1 8.0*  MG  --  1.5* 1.6*  --  1.4* 1.4*   GFR: Estimated Creatinine Clearance: 46.4 mL/min (by C-G formula based on SCr of 1 mg/dL). Liver Function Tests: Recent Labs  Lab 09/30/17 1218 10/01/17 0520 10/02/17 1028 10/03/17 0606  AST 11* 11*  --  15  ALT 10* 8*  --  9*  ALKPHOS 84 60  --  64  BILITOT 0.4 0.5  --  0.3  PROT 8.4* 6.3*  --  6.9  ALBUMIN 3.2* 2.3* 2.4* 2.5*   Recent Labs  Lab  09/30/17 1218  LIPASE 18   No results for input(s): AMMONIA in the last 168 hours. Coagulation Profile: Recent Labs  Lab 10/01/17 0934  INR 1.23   Cardiac Enzymes: No results for input(s): CKTOTAL, CKMB, CKMBINDEX, TROPONINI in the last 168 hours. BNP (last 3 results) No results for input(s): PROBNP in the last 8760 hours. HbA1C: No results for input(s): HGBA1C in the last 72 hours. CBG: Recent Labs  Lab 10/04/17 1209 10/04/17 1700 10/04/17 1956 10/05/17 0746 10/05/17 1151  GLUCAP 99 186* 77 97 117*   Lipid Profile: No results for input(s): CHOL, HDL, LDLCALC, TRIG, CHOLHDL, LDLDIRECT in the last 72 hours. Thyroid  Function Tests: No results for input(s): TSH, T4TOTAL, FREET4, T3FREE, THYROIDAB in the last 72 hours. Anemia Panel: No results for input(s): VITAMINB12, FOLATE, FERRITIN, TIBC, IRON, RETICCTPCT in the last 72 hours. Sepsis Labs: No results for input(s): PROCALCITON, LATICACIDVEN in the last 168 hours.  Recent Results (from the past 240 hour(s))  Urine Culture     Status: Abnormal   Collection Time: 09/30/17  3:48 PM  Result Value Ref Range Status   Specimen Description   Final    URINE, SUPRAPUBIC Performed at [facility] [address], [place], [place] [address]    Special Requests   Final    Immunocompromised Performed at [facility], [place] [address], [place], [place] [address]    Culture (A)  Final    >=100,000 COLONIES/mL PSEUDOMONAS AERUGINOSA >=100,000 COLONIES/mL KLEBSIELLA PNEUMONIAE    Report Status 10/03/2017 FINAL  Final   Organism ID, Bacteria PSEUDOMONAS AERUGINOSA (A)  Final   Organism ID, Bacteria KLEBSIELLA PNEUMONIAE (A)  Final      Susceptibility   Klebsiella pneumoniae - MIC*    AMPICILLIN >=32 RESISTANT Resistant     CEFAZOLIN <=4 SENSITIVE Sensitive     CEFTRIAXONE <=1 SENSITIVE Sensitive     CIPROFLOXACIN <=0.25 SENSITIVE Sensitive     GENTAMICIN <=1 SENSITIVE Sensitive     IMIPENEM <=0.25 SENSITIVE Sensitive     NITROFURANTOIN 32 SENSITIVE Sensitive     TRIMETH/SULFA <=20 SENSITIVE Sensitive     AMPICILLIN/SULBACTAM 4 SENSITIVE Sensitive     PIP/TAZO <=4 SENSITIVE Sensitive     Extended ESBL NEGATIVE Sensitive     * >=100,000 COLONIES/mL KLEBSIELLA PNEUMONIAE   Pseudomonas aeruginosa - MIC*    CEFTAZIDIME 4 SENSITIVE Sensitive     CIPROFLOXACIN <=0.25 SENSITIVE Sensitive     GENTAMICIN <=1 SENSITIVE Sensitive     IMIPENEM 1 SENSITIVE Sensitive     PIP/TAZO 8 SENSITIVE Sensitive     CEFEPIME 2 SENSITIVE Sensitive     * >=100,000 COLONIES/mL PSEUDOMONAS AERUGINOSA         Radiology Studies: No  results found.      Scheduled Meds: . amLODipine  10 mg Oral Daily  . cholecalciferol  2,000 Units Oral Daily  . feeding supplement (ENSURE ENLIVE)  237 mL Oral BID  . finasteride  5 mg Oral Daily  . folic acid  1 mg Oral Daily  . heparin  5,000 Units Subcutaneous Q8H  . insulin aspart  0-5 Units Subcutaneous QHS  . insulin aspart  0-9 Units Subcutaneous TID WC  . levothyroxine  100 mcg Oral QAC breakfast  . magnesium oxide  400 mg Oral Daily  . megestrol  400 mg Oral Daily  . metoprolol succinate  100 mg Oral Daily  . multivitamin with minerals  1 tablet Oral Daily  . [START ON 10/06/2017] potassium chloride  20 mEq Oral Daily  . potassium chloride  40 mEq Oral Once  . senna  1 tablet Oral BID  . sodium chloride flush  3 mL Intravenous Q12H  . sodium chloride flush  5 mL Intracatheter Q8H  . tamsulosin  0.4 mg Oral Daily  . thiamine  100 mg Oral Daily   Continuous Infusions: . sodium chloride    . ciprofloxacin 400 mg (10/05/17 1437)  . potassium chloride 10 mEq (10/05/17 1251)     LOS: 5 days    Time spent: 25 mins.      Shelly Coss, MD [facility] Pager [phone]  If 7PM-7AM, please contact night-coverage [email or web address] Password [facility] 10/05/2017, 2:58 PM

## 2017-10-05 NOTE — Progress Notes (Signed)
Patient refusing PO medications.  MD notified via text page.

## 2017-10-05 NOTE — Progress Notes (Signed)
Chief Complaint: Patient was seen today for follow up PCNs   Supervising Physician: Jacqulynn Cadet  Patient Status: [facility] - In-pt  Subjective: Pt resting in bed, no specific c/o today S/p (B)PCN placement on 4/17 for hydronephrosis No issues with drains  Objective: Physical Exam: BP 135/81 (BP Location: Right Arm)   Pulse 79   Temp 98.6 F (37 C) (Oral)   Resp 17   Ht 5\' 6"  (1.676 m)   Wt 110 lb (49.9 kg)   SpO2 100%   BMI 17.75 kg/m  (B)PCNs intact, sites clean, dry Good clear UOP from both   Current Facility-Administered Medications:  .  0.9 %  sodium chloride infusion, 250 mL, Intravenous, PRN, Emokpae, Courage, MD .  acetaminophen (TYLENOL) tablet 650 mg, 650 mg, Oral, Q6H PRN **OR** acetaminophen (TYLENOL) suppository 650 mg, 650 mg, Rectal, Q6H PRN, Emokpae, Courage, MD .  albuterol (PROVENTIL) (2.5 MG/3ML) 0.083% nebulizer solution 2.5 mg, 2.5 mg, Nebulization, Q2H PRN, Emokpae, Courage, MD .  amLODipine (NORVASC) tablet 10 mg, 10 mg, Oral, Daily, Emokpae, Courage, MD, 10 mg at 10/04/17 0939 .  cholecalciferol (VITAMIN D) tablet 2,000 Units, 2,000 Units, Oral, Daily, Denton Brick, Courage, MD, 2,000 Units at 10/02/17 0919 .  ciprofloxacin (CIPRO) IVPB 400 mg, 400 mg, Intravenous, Q12H, Shelly Coss, MD, Stopped at 10/05/17 0039 .  feeding supplement (ENSURE ENLIVE) (ENSURE ENLIVE) liquid 237 mL, 237 mL, Oral, BID, Emokpae, Courage, MD, 237 mL at 10/03/17 1649 .  finasteride (PROSCAR) tablet 5 mg, 5 mg, Oral, Daily, Emokpae, Courage, MD, 5 mg at 10/02/17 0918 .  folic acid (FOLVITE) tablet 1 mg, 1 mg, Oral, Daily, Emokpae, Courage, MD, 1 mg at 10/02/17 0918 .  heparin injection 5,000 Units, 5,000 Units, Subcutaneous, Q8H, Minda Ditto, RPH, 5,000 Units at 10/05/17 0552 .  insulin aspart (novoLOG) injection 0-5 Units, 0-5 Units, Subcutaneous, QHS, Powell, A Clint Lipps., MD .  insulin aspart (novoLOG) injection 0-9 Units, 0-9 Units, Subcutaneous, TID WC, Elodia Florence., MD, 2 Units at 10/04/17 1800 .  levothyroxine (SYNTHROID, LEVOTHROID) tablet 100 mcg, 100 mcg, Oral, QAC breakfast, Emokpae, Courage, MD, 100 mcg at 10/04/17 0831 .  lip balm (CARMEX) ointment, , Topical, PRN, Emokpae, Courage, MD .  magnesium oxide (MAG-OX) tablet 400 mg, 400 mg, Oral, Daily, Adhikari, Amrit, MD .  megestrol (MEGACE) 400 MG/10ML suspension 400 mg, 400 mg, Oral, Daily, Emokpae, Courage, MD, 400 mg at 10/02/17 0919 .  metoprolol succinate (TOPROL-XL) 24 hr tablet 100 mg, 100 mg, Oral, Daily, Emokpae, Courage, MD, 100 mg at 10/02/17 0919 .  multivitamin with minerals tablet 1 tablet, 1 tablet, Oral, Daily, Emokpae, Courage, MD, 1 tablet at 10/02/17 0918 .  ondansetron (ZOFRAN) tablet 4 mg, 4 mg, Oral, Q6H PRN **OR** ondansetron (ZOFRAN) injection 4 mg, 4 mg, Intravenous, Q6H PRN, Denton Brick, Courage, MD, 4 mg at 10/01/17 0924 .  oxyCODONE (Oxy IR/ROXICODONE) immediate release tablet 5 mg, 5 mg, Oral, Q4H PRN, Denton Brick, Courage, MD, 5 mg at 10/04/17 0316 .  polyethylene glycol (MIRALAX / GLYCOLAX) packet 17 g, 17 g, Oral, Daily PRN, Denton Brick, Courage, MD .  Derrill Memo ON 10/06/2017] potassium chloride (K-DUR,KLOR-CON) CR tablet 20 mEq, 20 mEq, Oral, Daily, Adhikari, Amrit, MD .  potassium chloride (K-DUR,KLOR-CON) CR tablet 40 mEq, 40 mEq, Oral, Once, Adhikari, Amrit, MD .  potassium chloride 10 mEq in 100 mL IVPB, 10 mEq, Intravenous, Q1 Hr x 5, Adhikari, Amrit, MD, Last Rate: 100 mL/hr at 10/05/17 1146, 10 mEq at 10/05/17 1146 .  senna (SENOKOT) tablet 8.6 mg, 1 tablet, Oral, BID, Emokpae, Courage, MD, 8.6 mg at 10/04/17 2218 .  sodium chloride flush (NS) 0.9 % injection 3 mL, 3 mL, Intravenous, Q12H, Emokpae, Courage, MD, 3 mL at 10/04/17 2221 .  sodium chloride flush (NS) 0.9 % injection 3 mL, 3 mL, Intravenous, PRN, Emokpae, Courage, MD .  sodium chloride flush (NS) 0.9 % injection 5 mL, 5 mL, Intracatheter, Q8H, Jacqulynn Cadet, MD, 5 mL at 10/05/17 0552 .  tamsulosin  (FLOMAX) capsule 0.4 mg, 0.4 mg, Oral, Daily, Emokpae, Courage, MD, 0.4 mg at 10/02/17 0918 .  thiamine (VITAMIN B-1) tablet 100 mg, 100 mg, Oral, Daily, Emokpae, Courage, MD .  traZODone (DESYREL) tablet 50 mg, 50 mg, Oral, QHS PRN, Denton Brick, Courage, MD, 50 mg at 10/02/17 2125  Labs: CBC Recent Labs    10/03/17 0606 10/04/17 0953  WBC 11.3* 10.4  HGB 10.6* 10.6*  HCT 32.3* 31.7*  PLT 323 326   BMET Recent Labs    10/04/17 0953 10/05/17 0548  NA 138 135  K 2.6* 3.1*  CL 109 108  CO2 21* 21*  GLUCOSE 108* 101*  BUN 7 5*  CREATININE 1.02 1.00  CALCIUM 9.1 8.0*   LFT Recent Labs    10/03/17 0606  PROT 6.9  ALBUMIN 2.5*  AST 15  ALT 9*  ALKPHOS 64  BILITOT 0.3   PT/INR No results for input(s): LABPROT, INR in the last 72 hours.   Studies/Results: Ct Biopsy  Result Date: 10/03/2017 CLINICAL DATA:  Prostate carcinoma. Expansile lytic lesion involving the inferior left pubic ramus EXAM: CT GUIDED CORE BIOPSY OF LEFT PUBIC BONE LESION ANESTHESIA/SEDATION: Intravenous Fentanyl and Versed were administered as conscious sedation during continuous monitoring of the patient's level of consciousness and physiological / cardiorespiratory status by the radiology RN, with a total moderate sedation time of 39 minutes. PROCEDURE: The procedure risks, benefits, and alternatives were explained to the patient. Questions regarding the procedure were encouraged and answered. The patient understands and consents to the procedure. Select axial scans through the pelvis were obtained. The lesion was localized and an appropriate skin entry site was determined and marked. The operative field was prepped with chlorhexidinein a sterile fashion, and a sterile drape was applied covering the operative field. A sterile gown and sterile gloves were used for the procedure. Local anesthesia was provided with 1% Lidocaine. Under CT fluoroscopic guidance, a 17 gauge trocar needle was advanced to the margin of  the lesion. Once needle tip position was confirmed, coaxial 18-gauge core biopsy samples were obtained, submitted in formalin to surgical pathology. The guide needle was removed. Postprocedure scans show no hemorrhage or other apparent complication. The patient tolerated the procedure well. COMPLICATIONS: None immediate FINDINGS: Lytic soft tissue mass involving inferior left pubic ramus was again localized. Representative core biopsy samples obtained as above. IMPRESSION: 1. Technically successful CT-guided core biopsy, left pubic bone lytic lesion. Electronically Signed   By: Lucrezia Europe M.D.   On: 10/03/2017 15:07    Assessment/Plan: (B)hydronephrosis in setting of prostate cancer S/p (B)PCN 4/17 Tubes functioning well. IR following    LOS: 5 days   I spent a total of 15 minutes in face to face in clinical consultation, greater than 50% of which was counseling/coordinating care for (B)PCNs  Ascencion Dike PA-C 10/05/2017 12:29 PM

## 2017-10-05 NOTE — Progress Notes (Signed)
PT Cancellation Note  Patient Details Name: Ricardo Johnson MRN: [id number] DOB: [birth year]   Cancelled Treatment:    Reason Eval/Treat Not Completed: Attempted PT eval. Pt refused participation. Will check back another day.    Weston Anna, MPT Pager: [phone]

## 2017-10-06 DIAGNOSIS — N179 Acute kidney failure, unspecified: Principal | ICD-10-CM

## 2017-10-06 LAB — BASIC METABOLIC PANEL
ANION GAP: 11 (ref 5–15)
BUN: 7 mg/dL (ref 6–20)
CO2: 18 mmol/L — ABNORMAL LOW (ref 22–32)
Calcium: 7.8 mg/dL — ABNORMAL LOW (ref 8.9–10.3)
Chloride: 108 mmol/L (ref 101–111)
Creatinine, Ser: 0.95 mg/dL (ref 0.61–1.24)
GFR calc Af Amer: >60 mL/min (ref >60)
GFR calc non Af Amer: >60 mL/min (ref >60)
GLUCOSE: 92 mg/dL (ref 65–99)
Potassium: 3.4 mmol/L — ABNORMAL LOW (ref 3.5–5.1)
Sodium: 137 mmol/L (ref 135–145)

## 2017-10-06 LAB — GLUCOSE, CAPILLARY
GLUCOSE-CAPILLARY: 87 mg/dL (ref 65–99)
Glucose-Capillary: 91 mg/dL (ref 65–99)
Glucose-Capillary: 104 mg/dL — ABNORMAL HIGH (ref 65–99)
Glucose-Capillary: 102 mg/dL — ABNORMAL HIGH (ref 65–99)

## 2017-10-06 LAB — MAGNESIUM: Magnesium: 1.5 mg/dL — ABNORMAL LOW (ref 1.7–2.4)

## 2017-10-06 MED ORDER — CIPROFLOXACIN HCL 500 MG PO TABS
500.0000 mg | ORAL_TABLET | Freq: Two times a day (BID) | ORAL | Status: DC
  Administered 2017-10-06 – 2017-10-08 (×5): 500 mg via ORAL
  Filled 2017-10-06 (×5): qty 1

## 2017-10-06 MED ORDER — POTASSIUM CHLORIDE CRYS ER 10 MEQ PO TBCR
40.0000 meq | EXTENDED_RELEASE_TABLET | Freq: Two times a day (BID) | ORAL | Status: DC
  Administered 2017-10-06: 40 meq via ORAL
  Filled 2017-10-06 (×2): qty 4

## 2017-10-06 MED ORDER — MAGNESIUM OXIDE 400 (241.3 MG) MG PO TABS
800.0000 mg | ORAL_TABLET | Freq: Two times a day (BID) | ORAL | Status: DC
  Administered 2017-10-06: 800 mg via ORAL
  Filled 2017-10-06 (×2): qty 2

## 2017-10-06 MED ORDER — POTASSIUM CHLORIDE CRYS ER 10 MEQ PO TBCR: 20.0000 meq | EXTENDED_RELEASE_TABLET | Freq: Two times a day (BID) | ORAL | Status: DC

## 2017-10-06 MED ORDER — SODIUM CHLORIDE 0.9 % IV SOLN
INTRAVENOUS | Status: DC
  Administered 2017-10-07 – 2017-10-08 (×3): via INTRAVENOUS

## 2017-10-06 NOTE — Progress Notes (Signed)
Nurse Notified.

## 2017-10-06 NOTE — Progress Notes (Addendum)
IP PROGRESS NOTE  Subjective:   Mr. Pieczynski reports pain at the lower abdomen.  He reports adequate pain relief with the current narcotic regimen.  Objective: Vital signs in last 24 hours: Blood pressure 135/75, pulse 92, temperature 98.7 F (37.1 C), temperature source Oral, resp. rate 18, height 5\' 6"  (1.676 m), weight 110 lb (49.9 kg), SpO2 100 %.  Intake/Output from previous day: 04/21 0701 - 04/22 0700 In: 4 [P.O.:520; IV Piggyback:200] Out: 2415 [Urine:2415]  Physical Exam:  GU: Suprapubic cathete  Rectal: There is firm masslike fullness at the anterior anal verge.  He has severe pain with palpation of this area.  I was unable to enter the anal canal secondary to stenosis and pain.   Lab Results: Recent Labs    10/04/17 0953  WBC 10.4  HGB 10.6*  HCT 31.7*  PLT 326    BMET Recent Labs    10/05/17 0548 10/06/17 0552  NA 135 137  K 3.1* 3.4*  CL 108 108  CO2 21* 18*  GLUCOSE 101* 92  BUN 5* 7  CREATININE 1.00 0.95  CALCIUM 8.0* 7.8*    No results found for: CEA1  Studies/Results: No results found.  Medications: I have reviewed the patient's current medications.  Assessment/Plan: 1.  Prostate cancer 2005, Gleason 6, minute focus of prostate adenocarcinoma involving right prostate biopsies, Gleason 6 adenocarcinoma involving less than 10% of the left prostate biopsies, treated with radiation seed therapy 2.  Hypercalcemia-resolved following intravenous hydration and pamidronate 3.  Obstructive nephropathy with hydronephrosis and acute renal failure-renal failure has resolved  Status post bilateral percutaneous nephrostomy tubes 10/01/2017. 4.  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  Diverting colostomy 6.  Weight loss 7.  Altered mental status 8.  History of tobacco and alcohol use 9.  Squamous cell carcinoma  CT 09/07/2017- progression of pelvic tumor involving the urinary bladder, and perineum.   Rectourethral fistula, obstruction of the distal right ureter, new hepatic lesions  CT abdomen/pelvis 09/30/2017-bilateral hydronephrosis and hydroureter, enlarged/irregular prostate, tumor involves the bladder and anterior rectal wall and likely the seminal vesicles, bone destruction of the left pubic body and left inferior pubic ramus  CT biopsy of the left pubic bone lesion on 10/03/2017-squamous cell carcinoma  Mr. Shatto appears unchanged.  The biopsy of the destructive left pubic mass reveals squamous cell carcinoma.  Physical exam today reveals firm masslike fullness of the anterior anal verge.  I suspect he has anal cancer with direct extension to the rectum, prostate, and bladder.  The differential diagnosis includes a primary squamous cell carcinoma of the rectum, bladder, or urethra.  This may be a secondary malignancy.  I will ask radiation oncology to evaluate him.  He may be a candidate for a palliative course of chemotherapy/radiation.  He most likely has distant metastatic disease involving the liver.  I will present his case at the GI tumor conference on 10/08/2017.  I discussed the situation with his daughter by telephone.  I explained that he has a locally advanced and probably metastatic malignancy.  She reports he is quiet difficult to understand.  He has been declining for the past few months.  She stated that it would be difficult for him to tolerate treatment for the cancer. He lives alone and will need nursing facility placement at discharge.  We discussed the likelihood that he may best benefit from hospice care.  She understands and is in agreement.  She plans to discuss  his case with other family members.  She will meet me in his room in the morning on 10/07/2017.  I discussed CPR and ACLS issues with his daughter.  This discussion will continue on 10/07/2017. Recommendations: 1.  Continue supportive care measures 2.  I will meet with Mr. Dombek daughter in the a.m. on  10/07/2017 to discuss treatment options 3.  Radiation oncology consult 4.  Increase IV fluids for the postobstructive diuresis 5.  Consider stopping the finasteride and tamsulosin    LOS: 6 days   Betsy Coder, MD   10/06/2017, 7:22 AM

## 2017-10-06 NOTE — Progress Notes (Addendum)
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 He underwent CT imagings which showed tumor involving the  prostate and adjacent  soft tissues with destruction of left pubis.  Recurrence of the prostate cancer has been suspected but it could also be a secondary malignancy or a ureteral carcinoma.  Patient underwent biopsy of the tumor ar the left pubis on 10/03/17.Waiting for biopsy report and further input from oncology.   Assessment & Plan:   Principal Problem:   Hypercalcemia Active Problems:   ADENOCARCINOMA, PROSTATE with Liver Mets   Essential hypertension   AKI (acute kidney injury) ([place])   Anemia   Suprapubic catheter in place   Acute bilateral obstructive uropathy/Bil hydroureter and Bil hydronephrosis   Encounter for palliative care   Goals of care, counseling/discussion   Protein-calorie malnutrition, severe  Metastatic Cancer:  Hx of prostate cancer stage T1c gleason 3+3 s/p brachytherapy in 2005.  Concern for recurrent prostate cancer on imaging, but normal PSA.  Pathology from recent liver biopsy on 3/28 (care everywhere) suspicious for "possible metastatic prostatic adenocarcinoma, but not definitive."  Per oncology, concerning for undifferentiated prostate cancer vs second primary malignancy.    Patient underwent biopsy of the tumor ar the left pubis on  10/03/17. 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 Patient will be seen by oncology 4/22 as per Dr. Benay Spice  Acute kidney injury:  in the setting of obstruction and UTI Initially presented with acute kidney injury due to obstruction and dehydration.  Patient was also on ACE inhibitors, naproxen and hydrochlorthiazide at home. Presented with creatinine of 3.5 .  Baseline creatinine 1. Creatinine is back to baseline Acute kidney injury resolved with percutaneous nephrostomy tube placement.   Status post bilateral kidneys nephrostomy tube placement on 4/17  UTI: Urine culture grew Pseudomonas and Klebsiella, both pan sensitive Treated with multiple different antibiotics  will treat with ciprofloxacin until 4/26  Obstructive Uropathy with AKI and Bilateral Hydronephrosis and Hydroureter:  S/p percutaneous nephrostomy tubes on 4/17 by IR Seen by [name] on 4/16 who recommended bilateral nephrostomy tube placement for decompression (of note, pt with hx of obliterated urethra and unable to place ureteral stents.  Pt with suprapubic catheter. Pt sees urologist, [name] at [place], flomax  Hypokalemia/hypomagnesemia:  Repleted, repeat levels tomorrow  Hypernatremia: Improved.  Obliterated urethra with rectourethral/rectovesical fisula: Suprapubic catheter and s/p bowel diversion.   [name] noted clear yellow drainage in colostomy bag concerning for possible new fistula.S/P  diversion with nephrostomy tubes .  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  Dietician c/s.  Poor PO intake over past few weeks.     Acute on chronic Anemia- hemoglobin isdown to 9.6 from 12.2 on admission due to hemodilution, suspect chronic anemia related to underlying  malignancy.  We will continue to monitor H&H  HTN-Held lisinopril and HCTZ due to AKI.Continue amlodipine 10 mg daily,metoprolol 100 mg daily.BP stable    Deconditioning/debility: PT following  DVT prophylaxis: Heparin [place] Code Status: Full Family Communication: None present at the bedside Disposition Plan: patient declined PT, oncology to see the patient today regarding the results of his biopsy from 4/19   Consultants: Urology, oncology  Procedures: Bilateral percutaneous nephrostomy tubes on 4/17 Biopsy left pubis on 10/03/17  Antimicrobials:  Anti-infectives (From admission, onward)   Start     Dose/Rate Route Frequency Ordered Stop   10/04/17 1215  ciprofloxacin (CIPRO) IVPB 400 mg     400 mg 200 mL/hr over 60 Minutes Intravenous Every 12 hours 10/04/17 1205 10/06/17 0113   10/04/17 1200  ciprofloxacin (CIPRO) tablet 500 mg  Status:  Discontinued     500 mg Oral 2 times daily 10/04/17 1102 10/04/17 1205   10/03/17 1000  cefTAZidime (FORTAZ) 1 g in sodium chloride 0.9 % 100 mL IVPB  Status:  Discontinued     1 g 200 mL/hr over 30 Minutes Intravenous Every 12 hours 10/03/17 0833 10/04/17 1102   10/02/17 1200  cefTAZidime (FORTAZ) 1 g in sodium chloride 0.9 % 100 mL IVPB  Status:  Discontinued     1 g 200 mL/hr over 30 Minutes Intravenous Every 24 hours 10/02/17 1107 10/03/17 0833   09/30/17 2000  cefTRIAXone (ROCEPHIN) 1 g in sodium chloride 0.9 % 100 mL IVPB  Status:  Discontinued     1 g 200 mL/hr over 30 Minutes Intravenous Every 24 hours 09/30/17 1914 10/02/17 1051       Subjective:  chronically ill-appearing, in the seated, laying in bed, depressed, made him aware of his results on the biopsy 4/19 also notified him that Dr. Benay Spice  Will be seeing him, otherwise stable   Objective: Vitals:   10/05/17 0515 10/05/17 1427 10/05/17 2015 10/06/17 0521  BP: 135/81 129/73 138/77 135/75  Pulse: 79 87 84 92  Resp: 17 18 17 18   Temp: 98.6 F (37 C) 98.6 F (37 C) 98  F (36.7 C) 98.7 F (37.1 C)  TempSrc: Oral Oral Oral Oral  SpO2: 100% 100% 98% 100%  Weight:      Height:        Intake/Output Summary (Last 24 hours) at 10/06/2017 1219 Last data filed at 10/06/2017 0522 Gross per 24 hour  Intake 630 ml  Output 1690 ml  Net -1060 ml   Filed Weights   09/30/17 1856  Weight: 49.9 kg (110 lb)    Examination:  General exam: Appears calm and comfortable, Cachetic, chronically ill HEENT:PERRL,Oral mucosa moist, Ear/Nose normal on gross exam Respiratory system: Bilateral equal air entry, normal vesicular breath sounds, no wheezes or crackles  Cardiovascular system: S1 & S2 heard, RRR. No JVD, murmurs, rubs, gallops or clicks. Gastrointestinal system: Abdomen is nondistended, soft and nontender. No organomegaly or masses felt. Normal bowel sounds heard. Colostomy bag, bilateral percutaneous nephrostomy tubes, suprapubic catheter Central nervous system: Alert and oriented. No focal neurological deficits. Extremities: No edema, no clubbing ,no cyanosis, distal peripheral pulses palpable. Skin: No rashes, lesions or ulcers,no icterus ,no pallor Psychiatry: Judgement and insight appear normal. Mood & affect appropriate.   Data Reviewed: I have personally reviewed following labs and imaging studies  CBC: Recent Labs  Lab 09/30/17 1218 10/01/17 0520 10/02/17 1028 10/03/17 0606 10/04/17 0953  WBC 10.2 9.9 10.5 11.3* 10.4  NEUTROABS  --   --  8.3*  --  7.8*  HGB 12.2* 9.7* 9.6* 10.6* 10.6*  HCT 37.2* 29.8* 29.9* 32.3* 31.7*  MCV 89.4 89.2 90.6 89.2 87.1  PLT 374 325 308 323 301   Basic Metabolic Panel: Recent Labs  Lab 10/02/17 1028 10/03/17 0606 10/03/17 1959 10/04/17 0953 10/05/17 0548 10/06/17 0552  NA 148* 145  --  138 135 137  K 3.2* 2.7* 3.2* 2.6* 3.1* 3.4*  CL 123* 117*  --  109 108 108  CO2 17* 19*  --  21* 21* 18*  GLUCOSE 135* 173*  --  108* 101* 92  BUN 32* 17  --  7 5* 7  CREATININE 1.60* 1.23  --  1.02 1.00 0.95    CALCIUM 10.2 9.9  --  9.1 8.0* 7.8*  MG 1.5* 1.6*  --  1.4* 1.4* 1.5*   GFR: Estimated Creatinine Clearance: 48.9 mL/min (by C-G formula based on SCr of 0.95 mg/dL). Liver Function Tests: Recent Labs  Lab 09/30/17 1218 10/01/17 0520 10/02/17 1028 10/03/17 0606  AST 11* 11*  --  15  ALT 10* 8*  --  9*  ALKPHOS 84 60  --  64  BILITOT 0.4 0.5  --  0.3  PROT 8.4* 6.3*  --  6.9  ALBUMIN 3.2* 2.3* 2.4* 2.5*   Recent Labs  Lab 09/30/17 1218  LIPASE 18   No results for input(s): AMMONIA in the last 168 hours. Coagulation Profile: Recent Labs  Lab 10/01/17 0934  INR 1.23   Cardiac Enzymes: No results for input(s): CKTOTAL, CKMB, CKMBINDEX, TROPONINI in the last 168 hours. BNP (last 3 results) No results for input(s): PROBNP in the last 8760 hours. HbA1C: No results for input(s): HGBA1C in the last 72 hours. CBG: Recent Labs  Lab 10/05/17 1151 10/05/17 1717 10/05/17 2013 10/06/17 0720 10/06/17 1138  GLUCAP 117* 142* 79 91 104*   Lipid Profile: No results for input(s): CHOL, HDL, LDLCALC, TRIG, CHOLHDL, LDLDIRECT in the last 72 hours. Thyroid Function Tests: No results for input(s): TSH, T4TOTAL, FREET4, T3FREE, THYROIDAB in the last 72 hours. Anemia Panel: No results for input(s): VITAMINB12, FOLATE, FERRITIN, TIBC, IRON, RETICCTPCT in the last 72 hours. Sepsis Labs: No results for input(s): PROCALCITON, LATICACIDVEN in the last 168 hours.  Recent Results (from the past 240 hour(s))  Urine Culture     Status: Abnormal   Collection Time: 09/30/17  3:48 PM  Result Value Ref Range Status   Specimen Description   Final    URINE, SUPRAPUBIC Performed at [facility] [address], [place], [place] [address]    Special Requests   Final    Immunocompromised Performed at [facility], [place] [address] [place], [place] [address]    Culture (A)  Final    >=100,000 COLONIES/mL PSEUDOMONAS AERUGINOSA >=100,000 COLONIES/mL  KLEBSIELLA PNEUMONIAE    Report Status 10/03/2017 FINAL  Final   Organism ID, Bacteria PSEUDOMONAS AERUGINOSA (A)  Final   Organism ID, Bacteria KLEBSIELLA PNEUMONIAE (A)  Final      Susceptibility   Klebsiella pneumoniae - MIC*    AMPICILLIN >=32 RESISTANT Resistant     CEFAZOLIN <=4 SENSITIVE Sensitive     CEFTRIAXONE <=1 SENSITIVE Sensitive     CIPROFLOXACIN <=0.25 SENSITIVE Sensitive     GENTAMICIN <=1 SENSITIVE Sensitive     IMIPENEM <=0.25 SENSITIVE Sensitive     NITROFURANTOIN 32 SENSITIVE Sensitive     TRIMETH/SULFA <=20 SENSITIVE Sensitive     AMPICILLIN/SULBACTAM 4 SENSITIVE Sensitive     PIP/TAZO <=4  SENSITIVE Sensitive     Extended ESBL NEGATIVE Sensitive     * >=100,000 COLONIES/mL KLEBSIELLA PNEUMONIAE   Pseudomonas aeruginosa - MIC*    CEFTAZIDIME 4 SENSITIVE Sensitive     CIPROFLOXACIN <=0.25 SENSITIVE Sensitive     GENTAMICIN <=1 SENSITIVE Sensitive     IMIPENEM 1 SENSITIVE Sensitive     PIP/TAZO 8 SENSITIVE Sensitive     CEFEPIME 2 SENSITIVE Sensitive     * >=100,000 COLONIES/mL PSEUDOMONAS AERUGINOSA         Radiology Studies: No results found.      Scheduled Meds: . amLODipine  10 mg Oral Daily  . cholecalciferol  2,000 Units Oral Daily  . feeding supplement (ENSURE ENLIVE)  237 mL Oral BID  . finasteride  5 mg Oral Daily  . folic acid  1 mg Oral Daily  . heparin  5,000 Units Subcutaneous Q8H  . insulin aspart  0-5 Units Subcutaneous QHS  . insulin aspart  0-9 Units Subcutaneous TID WC  . levothyroxine  100 mcg Oral QAC breakfast  . magnesium oxide  400 mg Oral Daily  . megestrol  400 mg Oral Daily  . metoprolol succinate  100 mg Oral Daily  . multivitamin with minerals  1 tablet Oral Daily  . potassium chloride  20 mEq Oral BID  . potassium chloride  40 mEq Oral Once  . senna  1 tablet Oral BID  . sodium chloride flush  3 mL Intravenous Q12H  . sodium chloride flush  5 mL Intracatheter Q8H  . tamsulosin  0.4 mg Oral Daily  . thiamine   100 mg Oral Daily   Continuous Infusions: . sodium chloride       LOS: 6 days    Time spent: 25 mins.      Reyne Dumas, MD    If 7PM-7AM, please contact night-coverage [email or web address] Password [facility] 10/06/2017, 12:19 PM

## 2017-10-06 NOTE — Progress Notes (Signed)
PT Cancellation Note  Patient Details Name: Ricardo Johnson MRN: [id number] DOB: Aug 01, 1945   Cancelled Treatment:    Reason Eval/Treat Not Completed: Pain limiting ability to participate Pt declined reporting not feeling well today and states he has pain.  Agreeable to pain meds and informed RN.  Pt declined PT checking back today after pain meds.   Ezequiel Macauley,KATHrine E 10/06/2017, 1:39 PM Carmelia Bake, PT, DPT 10/06/2017 Pager: [phone]

## 2017-10-07 ENCOUNTER — Telehealth: Payer: Self-pay

## 2017-10-07 DIAGNOSIS — E43 Unspecified severe protein-calorie malnutrition: Secondary | ICD-10-CM

## 2017-10-07 DIAGNOSIS — C61 Malignant neoplasm of prostate: Secondary | ICD-10-CM

## 2017-10-07 DIAGNOSIS — N139 Obstructive and reflux uropathy, unspecified: Secondary | ICD-10-CM

## 2017-10-07 LAB — HEMOGLOBIN AND HEMATOCRIT, BLOOD
HEMATOCRIT: 24.6 % — AB (ref 39.0–52.0)
HEMOGLOBIN: 8.3 g/dL — AB (ref 13.0–17.0)

## 2017-10-07 LAB — COMPREHENSIVE METABOLIC PANEL
ALT: 10 U/L — ABNORMAL LOW (ref 17–63)
ANION GAP: 6 (ref 5–15)
AST: 12 U/L — ABNORMAL LOW (ref 15–41)
Albumin: 2.2 g/dL — ABNORMAL LOW (ref 3.5–5.0)
Alkaline Phosphatase: 50 U/L (ref 38–126)
BUN: 8 mg/dL (ref 6–20)
CALCIUM: 8 mg/dL — AB (ref 8.9–10.3)
CHLORIDE: 113 mmol/L — AB (ref 101–111)
CO2: 20 mmol/L — ABNORMAL LOW (ref 22–32)
Creatinine, Ser: 1.05 mg/dL (ref 0.61–1.24)
Glucose, Bld: 101 mg/dL — ABNORMAL HIGH (ref 65–99)
Potassium: 4.1 mmol/L (ref 3.5–5.1)
Sodium: 139 mmol/L (ref 135–145)
Total Bilirubin: 0.2 mg/dL — ABNORMAL LOW (ref 0.3–1.2)
Total Protein: 5.9 g/dL — ABNORMAL LOW (ref 6.5–8.1)

## 2017-10-07 LAB — GLUCOSE, CAPILLARY
Glucose-Capillary: 109 mg/dL — ABNORMAL HIGH (ref 65–99)
Glucose-Capillary: 120 mg/dL — ABNORMAL HIGH (ref 65–99)
Glucose-Capillary: 93 mg/dL (ref 65–99)
Glucose-Capillary: 155 mg/dL — ABNORMAL HIGH (ref 65–99)

## 2017-10-07 LAB — CBC
HCT: 23.5 % — ABNORMAL LOW (ref 39.0–52.0)
HEMOGLOBIN: 7.9 g/dL — AB (ref 13.0–17.0)
MCH: 29.2 pg (ref 26.0–34.0)
MCHC: 33.6 g/dL (ref 30.0–36.0)
MCV: 86.7 fL (ref 78.0–100.0)
PLATELETS: 245 10*3/uL (ref 150–400)
RBC: 2.71 MIL/uL — AB (ref 4.22–5.81)
RDW: 12.8 % (ref 11.5–15.5)
WBC: 10.6 10*3/uL — AB (ref 4.0–10.5)

## 2017-10-07 LAB — MAGNESIUM: MAGNESIUM: 1.4 mg/dL — AB (ref 1.7–2.4)

## 2017-10-07 LAB — CALCIUM, IONIZED: CALCIUM, IONIZED, SERUM: 5 mg/dL (ref 4.5–5.6)

## 2017-10-07 MED ORDER — ENOXAPARIN SODIUM 30 MG/0.3ML ~~LOC~~ SOLN
30.0000 mg | Freq: Every day | SUBCUTANEOUS | Status: DC
  Administered 2017-10-07: 30 mg via SUBCUTANEOUS
  Filled 2017-10-07: qty 0.3

## 2017-10-07 MED ORDER — CIPROFLOXACIN HCL 500 MG PO TABS: 500.0000 mg | ORAL_TABLET | Freq: Two times a day (BID) | ORAL | 0 refills | Status: AC

## 2017-10-07 NOTE — Progress Notes (Addendum)
LCSW following for residential hospice placement.   4:27: UPDATE- Patient is not transporting. Per Harmon Pier, patient's daughter states she did not understand that IV fluids would stop. She states she has 3 other siblings that need to be involved in this decision. Daughter is to meet with Dr. Benay Spice in the morning.   LCSW will continue to follow for dc needs.   Patient has bed at [facility] place.   LCSW confirmed bed.  Patient will transport by [facility]. RN will call for [facility].  Packet on patients chart.  LCSW notified family.   LCSW faxed dc docs to facility.   RN report #: [place], Shawna Clamp Murphy [phone]

## 2017-10-07 NOTE — Consult Note (Signed)
   [facility] Inpatient Consult   10/07/2017  Diyari Cherne Zinn 03-Aug-1945 [id number]   Patient screened for potential [facility] Care Management due to recent outreach from [place].  Chart reviewed. Noted Mr. Jourdan will discharge to a residential hospice facility. No identifiable [facility] Care Management needs.    Marthenia Rolling, MSN-Ed, RN,BSN [facility] Liaison [phone]

## 2017-10-07 NOTE — Progress Notes (Addendum)
Referring Physician(s): Bell,E/Sherrill,B  Supervising Physician: Aletta Edouard  Patient Status:  [facility] - In-pt  Chief Complaint:  Prostate cancer, squamous cell cancer (unknown origin), bilateral hydronephrosis  Subjective:  Pt resting quietly in bed.  Family in room.  Utters few responses to questions, mainly yes and no; no acute change from previous   Allergies: Patient has no known allergies.  Medications: Prior to Admission medications   Medication Sig Start Date End Date Taking? Authorizing Provider  amLODipine (NORVASC) 10 MG tablet Take 10 mg by mouth daily.  08/25/15   [provider]  Cholecalciferol (VITAMIN D) 2000 units CAPS Take 2,000 Units by mouth daily.     [provider]  finasteride (PROSCAR) 5 MG tablet TAKE 1 TABLET BY MOUTH EVERY DAY Patient taking differently: TAKE 1 TABLET (5mg ) BY MOUTH EVERY DAY 01/04/11   Kalia-Reynolds, Maitri S, DO  folic acid (FOLVITE) 1 MG tablet Take 1 tablet (1 mg total) by mouth daily. 08/11/16   Modena Jansky, MD  levothyroxine (SYNTHROID, LEVOTHROID) 100 MCG tablet Take 100 mcg by mouth daily before breakfast. 07/16/16   [provider]  lisinopril-hydrochlorothiazide (PRINZIDE,ZESTORETIC) 20-25 MG tablet Take 1 tablet by mouth daily. 04/22/16   [provider]  metoprolol succinate (TOPROL-XL) 100 MG 24 hr tablet Take 100 mg by mouth daily. 08/25/15   [provider]  Multiple Vitamin (MULTIVITAMIN WITH MINERALS) TABS tablet Take 1 tablet by mouth daily. 08/11/16   Hongalgi, Lenis Dickinson, MD  naproxen sodium (ALEVE) 220 MG tablet Take 220 mg by mouth 2 (two) times daily as needed (pain).    [provider]  oxyCODONE (OXY IR/ROXICODONE) 5 MG immediate release tablet Take 5 mg by mouth every 4 (four) hours. 09/17/17   [provider]  Tamsulosin HCl (FLOMAX) 0.4 MG CAPS TAKE ONE CAPSULE BY MOUTH EVERY DAY Patient taking differently: TAKE ONE CAPSULE (0.4mg ) BY MOUTH EVERY  DAY 11/27/10   Kalia-Reynolds, Maitri S, DO  thiamine 100 MG tablet Take 1 tablet (100 mg total) by mouth daily. 08/11/16   Modena Jansky, MD     Vital Signs: BP 126/75 (BP Location: Left Arm)   Pulse 78   Temp 98.3 F (36.8 C)   Resp 16   Ht 5\' 6"  (1.676 m)   Wt 110 lb (49.9 kg)   SpO2 100%   BMI 17.75 kg/m   Physical Exam bilat PCN's intact, outputs L- 750 cc light yellow urine; R- 200 cc light yellow urine; sites not sig tender  Imaging: No results found.  Labs:  CBC: Recent Labs    10/02/17 1028 10/03/17 0606 10/04/17 0953 10/07/17 0101 10/07/17 1006  WBC 10.5 11.3* 10.4 10.6*  --   HGB 9.6* 10.6* 10.6* 7.9* 8.3*  HCT 29.9* 32.3* 31.7* 23.5* 24.6*  PLT 308 323 326 245  --     COAGS: Recent Labs    10/01/17 0934  INR 1.23    BMP: Recent Labs    10/04/17 0953 10/05/17 0548 10/06/17 0552 10/07/17 0101  NA 138 135 137 139  K 2.6* 3.1* 3.4* 4.1  CL 109 108 108 113*  CO2 21* 21* 18* 20*  GLUCOSE 108* 101* 92 101*  BUN 7 5* 7 8  CALCIUM 9.1 8.0* 7.8* 8.0*  CREATININE 1.02 1.00 0.95 1.05  GFRNONAA >60 >60 >60 >60  GFRAA >60 >60 >60 >60    LIVER FUNCTION TESTS: Recent Labs    09/30/17 1218 10/01/17 0520 10/02/17 1028 10/03/17 0606 10/07/17  0101  BILITOT 0.4 0.5  --  0.3 0.2*  AST 11* 11*  --  15 12*  ALT 10* 8*  --  9* 10*  ALKPHOS 84 60  --  64 50  PROT 8.4* 6.3*  --  6.9 5.9*  ALBUMIN 3.2* 2.3* 2.4* 2.5* 2.2*    Assessment and Plan: Patient with history of prostate cancer, bilateral hydronephrosis, known rectovesical fistula as well as obliterated urethra post brachytherapy 2005; status post diverting  bilateral PCNs on 4/17; status post biopsy of left pubic bone lytic lesion on 4/19 yielding squamous cell carcinoma; afebrile, creatinine normal, WBC 10.6, hemoglobin 8.3; patient now under evaluation for comfort care/hospice.   Electronically Signed: D. Rowe Robert, PA-C 10/07/2017, 3:01 PM   I spent a total of 15 minutes at the  the patient's bedside AND on the patient's hospital floor or unit, greater than 50% of which was counseling/coordinating care for bilateral nephrostomies    Patient ID: Ricardo Johnson, male   DOB: May 22, 1946, 72 y.o.   MRN: [id number]

## 2017-10-07 NOTE — Progress Notes (Signed)
IP PROGRESS NOTE  Subjective:   Ricardo Johnson appears unchanged.  His daughter is at the bedside.  He nods his head yes and no, but is not speaking in sentences. Objective: Vital signs in last 24 hours: Blood pressure 126/75, pulse 78, temperature 98.3 F (36.8 C), resp. rate 16, height 5\' 6"  (1.676 m), weight 110 lb (49.9 kg), SpO2 100 %.  Intake/Output from previous day: 04/22 0701 - 04/23 0700 In: 611.7 [P.O.:220; I.V.:361.7] Out: 1425 [Urine:1325; Stool:100]  Physical Exam: Not performed today  Lab Results: Recent Labs    10/07/17 0101 10/07/17 1006  WBC 10.6*  --   HGB 7.9* 8.3*  HCT 23.5* 24.6*  PLT 245  --     BMET Recent Labs    10/06/17 0552 10/07/17 0101  NA 137 139  K 3.4* 4.1  CL 108 113*  CO2 18* 20*  GLUCOSE 92 101*  BUN 7 8  CREATININE 0.95 1.05  CALCIUM 7.8* 8.0*    No results found for: CEA1  Studies/Results: No results found.  Medications: I have reviewed the patient's current medications.  Assessment/Plan: 1.  Prostate cancer 2005, Gleason 6, minute focus of prostate adenocarcinoma involving right prostate biopsies, Gleason 6 adenocarcinoma involving less than 10% of the left prostate biopsies, treated with radiation seed therapy 2.  Hypercalcemia-resolved following intravenous hydration and pamidronate 3.  Obstructive nephropathy with hydronephrosis and acute renal failure-renal failure has resolved  Status post bilateral percutaneous nephrostomy tubes 10/01/2017. 4.  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  Diverting colostomy 6.  Weight loss 7.  Altered mental status 8.  History of tobacco and alcohol use 9.  Squamous cell carcinoma  CT 09/07/2017- progression of pelvic tumor involving the urinary bladder, and perineum.  Rectourethral fistula, obstruction of the distal right ureter, new hepatic lesions  CT abdomen/pelvis 09/30/2017-bilateral hydronephrosis and hydroureter,  enlarged/irregular prostate, tumor involves the bladder and anterior rectal wall and likely the seminal vesicles, bone destruction of the left pubic body and left inferior pubic ramus  CT biopsy of the left pubic bone lesion on 10/03/2017-squamous cell carcinoma  Mr. Schellenberg appears unchanged.  He has been diagnosed with squamous cell carcinoma.  The primary tumor source is unclear, but may be the anus, bladder, or urethra.  He most likely has distant metastatic disease involving the liver.  He has a poor performance status.  I had a lengthy discussion with Ricardo Johnson and his daughter.  He is withdrawn and does not participate in conversation, but nods his head yes and no.  He appears to understand the diagnosis.  His daughter says that his mental status has been this way for a while and she feels he understands the conversation.  We discussed comfort care/hospice versus a trial of systemic therapy.  We also discussed palliative radiation, but I doubt he will be a candidate for radiation with the history of prostate radiation and fistula.  It would be difficult for him to undergo treatment based on his poor performance status and need for skilled nursing facility placement.  He and his daughter are in agreement with comfort care/hospice.  He is a candidate for [facility].  I will contact [name] at [facility].  His daughter reports [name] was following [name] closely prior to this hospital admission.  We discussed CPR and ACLS issues.  He agrees to a no CODE BLUE status. Recommendations: 1.  [facility] hosp[facility] referral to consider transfer to [facility]  2.  No CODE BLUE 3.  Narcotic analgesics as needed for pain 4.  Consolidate medical regimen for comfort   LOS: 7 days   Betsy Coder, MD   10/07/2017, 1:48 PM

## 2017-10-07 NOTE — Telephone Encounter (Signed)
Per Dr Benay Spice contact Dr. Mabeline Caras office and have them have [name] page him. Spoke with Dr. Mabeline Caras office and gave Dr. Gearldine Shown pager number and advised them to have [name] page him

## 2017-10-07 NOTE — Discharge Summary (Signed)
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 placement of a bilateral 10 French percutaneous nephrostomy tubes. Signed, Criselda Peaches, MD [facility] Radiology Electronically Signed   By: Jacqulynn Cadet M.D.   On: 10/01/2017 17:03   Ir Nephrostomy Placement Right  Result Date: 10/01/2017 INDICATION: 72 year old male with  bilateral hydronephrosis in the setting of prostate cancer. EXAM: IR NEPHROSTOMY PLACEMENT RIGHT; IR NEPHROSTOMY PLACEMENT LEFT COMPARISON:  CT abdomen/pelvis 09/30/2017 MEDICATIONS: 1 g Rocephin; The antibiotic was administered in an appropriate time frame prior to skin puncture. ANESTHESIA/SEDATION: Fentanyl 1 mcg IV; Versed 75 mg IV Moderate Sedation Time:  23 minutes The patient was continuously monitored during the procedure by the interventional radiology nurse under my direct supervision. CONTRAST:  1mL ISOVUE-300 IOPAMIDOL (ISOVUE-300) INJECTION 61%, 4mL ISOVUE-300 IOPAMIDOL (ISOVUE-300) INJECTION 61% - administered into the collecting system(s) FLUOROSCOPY TIME:  Fluoroscopy Time: 2 minutes 24 seconds (11 mGy). COMPLICATIONS: None immediate. TECHNIQUE: The procedure, risks, benefits,  and alternatives were explained to the patient. Questions regarding the procedure were encouraged and answered. The patient understands and consents to the procedure. LEFT PCN The left flank was prepped with chlorhexidine in a sterile fashion, and a sterile drape was applied covering the operative field. A sterile gown and sterile gloves were used for the procedure. Local anesthesia was provided with 1% Lidocaine. The left flank was interrogated with ultrasound and the left kidney identified. The kidney is hydronephrotic. A suitable access site on the skin overlying the lower pole, posterior calix was identified. After local mg anesthesia was achieved, a small skin nick was made with an 11 blade scalpel. A 21 gauge Accustick needle was then advanced under direct sonographic guidance into the lower pole of the left kidney. A 0.018 inch wire was advanced under fluoroscopic guidance into the left renal collecting system. The Accustick sheath was then advanced over the wire and a 0.018 system exchanged for a 0.035 system. Gentle hand injection of contrast material confirms placement of the sheath within the renal collecting system. There is hydronephrosis. The tract from the scan into the renal collecting system was then dilated serially to 10-French. A 10-French Cook all-purpose drain was then placed and positioned under fluoroscopic guidance. The locking loop is well formed within the left renal pelvis. The catheter was secured to the skin with 2-0 Prolene and a sterile bandage was placed. Catheter was left to gravity bag drainage. RIGHT PCN The right flank was prepped with chlorhexidine in a sterile fashion, and a sterile drape was applied covering the operative field. A sterile gown and sterile gloves were used for the procedure. Local anesthesia was provided with 1% Lidocaine. The right flank was interrogated with ultrasound and the left kidney identified. The kidney is hydronephrotic. A suitable access site on the  skin overlying the lower pole, posterior calix was identified. After local mg anesthesia was achieved, a small skin nick was made with an 11 blade scalpel. A 21 gauge Accustick needle was then advanced under direct sonographic guidance into the lower pole of the right kidney. A 0.018 inch wire was advanced under fluoroscopic guidance into the left renal collecting system. The Accustick sheath was then advanced over the wire and a 0.018 system exchanged for a 0.035 system. Gentle hand injection of contrast material confirms placement of the sheath within the renal collecting system. There is marked hydronephrosis. The tract from the scan into the renal collecting system was then dilated serially to 10-French. A 10-French Cook all-purpose drain was then placed and positioned under fluoroscopic guidance. The locking loop is well formed within the left renal pelvis. The catheter was secured to the skin with 2-0 Prolene and a sterile bandage was placed. Catheter was left to gravity bag drainage. IMPRESSION: Successful placement of a bilateral 10 French percutaneous nephrostomy tubes. Signed, Criselda Peaches, MD  [facility] Radiology Electronically Signed   By: Jacqulynn Cadet M.D.   On: 10/01/2017 17:03      Subjective: No issues  Discharge Exam: Vitals:   10/07/17 0437 10/07/17 1335  BP: (!) 142/85 126/75  Pulse: 91 78  Resp: 20 16  Temp:  98.3 F (36.8 C)  SpO2: 100% 100%   Vitals:   10/06/17 1410 10/06/17 2021 10/07/17 0437 10/07/17 1335  BP: 117/65 120/64 (!) 142/85 126/75  Pulse: 92 87 91 78  Resp: 16 (!) 24 20 16   Temp: 98.2 F (36.8 C) 99.2 F (37.3 C)  98.3 F (36.8 C)  TempSrc:  Oral    SpO2: 100% 100% 100% 100%  Weight:      Height:        General: Pt is alert, awake, not in acute distress Cardiovascular: RRR, S1/S2 +, no rubs, no gallops Respiratory: CTA bilaterally, no wheezing, no rhonchi Abdominal: Soft, NT, ND, bowel  sounds + Extremities: no edema, no cyanosis Psych: withdrawn, flat affect, depressed mood.    The results of significant diagnostics from this hospitalization (including imaging, microbiology, ancillary and laboratory) are listed below for reference.     Microbiology: Recent Results (from the past 240 hour(s))  Urine Culture     Status: Abnormal   Collection Time: 09/30/17  3:48 PM  Result Value Ref Range Status   Specimen Description   Final    URINE, SUPRAPUBIC Performed at [facility] [address] [place], [place] [address]    Special Requests   Final    Immunocompromised Performed at [facility], [place] [address], [place], [place] [address]    Culture (A)  Final    >=100,000 COLONIES/mL PSEUDOMONAS AERUGINOSA >=100,000 COLONIES/mL KLEBSIELLA PNEUMONIAE    Report Status 10/03/2017 FINAL  Final   Organism ID, Bacteria PSEUDOMONAS AERUGINOSA (A)  Final   Organism ID, Bacteria KLEBSIELLA PNEUMONIAE (A)  Final      Susceptibility   Klebsiella pneumoniae - MIC*    AMPICILLIN >=32 RESISTANT Resistant     CEFAZOLIN <=4 SENSITIVE Sensitive     CEFTRIAXONE <=1 SENSITIVE Sensitive     CIPROFLOXACIN <=0.25 SENSITIVE Sensitive     GENTAMICIN <=1 SENSITIVE Sensitive     IMIPENEM <=0.25 SENSITIVE Sensitive     NITROFURANTOIN 32 SENSITIVE Sensitive     TRIMETH/SULFA <=20 SENSITIVE Sensitive     AMPICILLIN/SULBACTAM 4 SENSITIVE Sensitive     PIP/TAZO <=4 SENSITIVE Sensitive     Extended ESBL NEGATIVE Sensitive     * >=100,000 COLONIES/mL KLEBSIELLA PNEUMONIAE   Pseudomonas aeruginosa - MIC*    CEFTAZIDIME 4 SENSITIVE Sensitive     CIPROFLOXACIN <=0.25 SENSITIVE Sensitive     GENTAMICIN <=1 SENSITIVE Sensitive     IMIPENEM 1 SENSITIVE Sensitive     PIP/TAZO 8 SENSITIVE Sensitive     CEFEPIME 2 SENSITIVE Sensitive     * >=100,000 COLONIES/mL PSEUDOMONAS AERUGINOSA     Labs: BNP (last 3 results) No results for input(s): BNP in the last  8760 hours. Basic Metabolic Panel: Recent Labs  Lab 10/03/17 0606 10/03/17 1959 10/04/17 0953 10/05/17 0548 10/06/17 0552 10/07/17 0101  NA 145  --  138 135 137 139  K 2.7* 3.2* 2.6* 3.1* 3.4* 4.1  CL 117*  --  109 108 108 113*  CO2 19*  --  21* 21* 18* 20*  GLUCOSE 173*  --  108* 101* 92 101*  BUN 17  --  7 5*  7 8  CREATININE 1.23  --  1.02 1.00 0.95 1.05  CALCIUM 9.9  --  9.1 8.0* 7.8* 8.0*  MG 1.6*  --  1.4* 1.4* 1.5* 1.4*   Liver Function Tests: Recent Labs  Lab 10/01/17 0520 10/02/17 1028 10/03/17 0606 10/07/17 0101  AST 11*  --  15 12*  ALT 8*  --  9* 10*  ALKPHOS 60  --  64 50  BILITOT 0.5  --  0.3 0.2*  PROT 6.3*  --  6.9 5.9*  ALBUMIN 2.3* 2.4* 2.5* 2.2*   No results for input(s): LIPASE, AMYLASE in the last 168 hours. No results for input(s): AMMONIA in the last 168 hours. CBC: Recent Labs  Lab 10/01/17 0520 10/02/17 1028 10/03/17 0606 10/04/17 0953 10/07/17 0101 10/07/17 1006  WBC 9.9 10.5 11.3* 10.4 10.6*  --   NEUTROABS  --  8.3*  --  7.8*  --   --   HGB 9.7* 9.6* 10.6* 10.6* 7.9* 8.3*  HCT 29.8* 29.9* 32.3* 31.7* 23.5* 24.6*  MCV 89.2 90.6 89.2 87.1 86.7  --   PLT 325 308 323 326 245  --    Cardiac Enzymes: No results for input(s): CKTOTAL, CKMB, CKMBINDEX, TROPONINI in the last 168 hours. BNP: Invalid input(s): POCBNP CBG: Recent Labs  Lab 10/06/17 1138 10/06/17 1836 10/06/17 2144 10/07/17 0801 10/07/17 1132  GLUCAP 104* 87 102* 109* 120*   D-Dimer No results for input(s): DDIMER in the last 72 hours. Hgb A1c No results for input(s): HGBA1C in the last 72 hours. Lipid Profile No results for input(s): CHOL, HDL, LDLCALC, TRIG, CHOLHDL, LDLDIRECT in the last 72 hours. Thyroid function studies No results for input(s): TSH, T4TOTAL, T3FREE, THYROIDAB in the last 72 hours.  Invalid input(s): FREET3 Anemia work up No results for input(s): VITAMINB12, FOLATE, FERRITIN, TIBC, IRON, RETICCTPCT in the last 72 hours. Urinalysis      Component Value Date/Time   COLORURINE YELLOW 09/30/2017 1548   APPEARANCEUR TURBID (A) 09/30/2017 1548   LABSPEC 1.009 09/30/2017 1548   PHURINE 9.0 (H) 09/30/2017 1548   GLUCOSEU NEGATIVE 09/30/2017 1548   HGBUR SMALL (A) 09/30/2017 1548   BILIRUBINUR NEGATIVE 09/30/2017 1548   KETONESUR NEGATIVE 09/30/2017 1548   PROTEINUR 100 (A) 09/30/2017 1548   UROBILINOGEN 1.0 01/27/2015 0611   NITRITE NEGATIVE 09/30/2017 1548   LEUKOCYTESUR LARGE (A) 09/30/2017 1548   Sepsis Labs Invalid input(s): PROCALCITONIN,  WBC,  LACTICIDVEN Microbiology Recent Results (from the past 240 hour(s))  Urine Culture     Status: Abnormal   Collection Time: 09/30/17  3:48 PM  Result Value Ref Range Status   Specimen Description   Final    URINE, SUPRAPUBIC Performed at [facility], [place] [address], [place], [place] [address]    Special Requests   Final    Immunocompromised Performed at [facility], [place] [address] [place], [place] [address]    Culture (A)  Final    >=100,000 COLONIES/mL PSEUDOMONAS AERUGINOSA >=100,000 COLONIES/mL KLEBSIELLA PNEUMONIAE    Report Status 10/03/2017 FINAL  Final   Organism ID, Bacteria PSEUDOMONAS AERUGINOSA (A)  Final   Organism ID, Bacteria KLEBSIELLA PNEUMONIAE (A)  Final      Susceptibility   Klebsiella pneumoniae - MIC*    AMPICILLIN >=32 RESISTANT Resistant     CEFAZOLIN <=4 SENSITIVE Sensitive     CEFTRIAXONE <=1 SENSITIVE Sensitive     CIPROFLOXACIN <=0.25 SENSITIVE Sensitive     GENTAMICIN <=1 SENSITIVE Sensitive  IMIPENEM <=0.25 SENSITIVE Sensitive     NITROFURANTOIN 32 SENSITIVE Sensitive     TRIMETH/SULFA <=20 SENSITIVE Sensitive     AMPICILLIN/SULBACTAM 4 SENSITIVE Sensitive     PIP/TAZO <=4 SENSITIVE Sensitive     Extended ESBL NEGATIVE Sensitive     * >=100,000 COLONIES/mL KLEBSIELLA PNEUMONIAE   Pseudomonas aeruginosa - MIC*    CEFTAZIDIME 4 SENSITIVE Sensitive     CIPROFLOXACIN <=0.25 SENSITIVE  Sensitive     GENTAMICIN <=1 SENSITIVE Sensitive     IMIPENEM 1 SENSITIVE Sensitive     PIP/TAZO 8 SENSITIVE Sensitive     CEFEPIME 2 SENSITIVE Sensitive     * >=100,000 COLONIES/mL PSEUDOMONAS AERUGINOSA     SIGNED:   Cordelia Poche, MD [facility] 10/07/2017, 2:53 PM Pager [phone]  If 7PM-7AM, please contact night-coverage [email or web address] Password [email or web address]

## 2017-10-08 DIAGNOSIS — G8929 Other chronic pain: Secondary | ICD-10-CM

## 2017-10-08 LAB — GLUCOSE, CAPILLARY
GLUCOSE-CAPILLARY: 91 mg/dL (ref 65–99)
GLUCOSE-CAPILLARY: 109 mg/dL — AB (ref 65–99)

## 2017-10-08 NOTE — Progress Notes (Signed)
Physical Therapy Discharge Patient Details Name: DONALDSON RICHTER MRN: [id number] DOB: 23-Feb-1946 Today's Date: 10/08/2017 Time:  -     Patient discharged from PT services secondary to DC to [facility].   GP     Claretha Cooper 10/08/2017, 1:41 PM

## 2017-10-08 NOTE — Progress Notes (Signed)
[facility])  Late entry from yesterday 10/07/17  Received request from [place] for family interest in [facility]. Spoke with daughter by phone before coming over to complete paperwork for transfer. Daughter declined offer to transfer due to [facility] is not able to offer continued hospital level of care as requested by daughter. Daughter fearful patient will decline rapidly without support of IV fluids. Spoke with Dr. Benay Spice, bedside RN and CSW to make aware of change in plans. Daughter asking about other options and wanting to discuss with her siblings. Made daughter aware of Dr. Gearldine Shown plan to see patient early Wednesday morning.   Please advise is family still interested in [facility] after discussing with family and MD.  Thank you,  Erling Conte, LCSW [phone]

## 2017-10-08 NOTE — Progress Notes (Signed)
[facility] with daughter by phone this morning to confirm desire for patient to transfer to [facility] today. Paper work for transfer completed yesterday. Daughter requesting that PTAR not be called until she is present.   Thank you,  Erling Conte, LCSW [phone]

## 2017-10-08 NOTE — Progress Notes (Signed)
Called report to [employer]. Gave report to RN.

## 2017-10-08 NOTE — Progress Notes (Signed)
Chief Complaint  Patient presents with  . Weakness  . Altered Mental Status   BP 137/78 (BP Location: Left Arm)   Pulse 78   Temp 98.4 F (36.9 C) (Oral)   Resp 16   Ht 5\' 6"  (1.676 m)   Wt 49.9 kg (110 lb)   SpO2 100%   BMI 17.75 kg/m   Patient reports some pain today, otherwise, no issues. Concern about discharge to [facility] yesterday. Discussed with Dr. Benay Spice who has had further discussions with family. Family is now okay for discharge to [facility]. Patient stable for transportation to [facility]. Patient appears withdrawn, but in no distress. Continue oxycodone prn for pain management in addition to Senna and Miralax for constipation prophylaxis. Anticipate discharge to [facility] this morning/afternoon.  Cordelia Poche, MD [facility] 10/08/2017, 9:47 AM Pager: [phone]

## 2017-10-08 NOTE — Clinical Social Work Note (Signed)
Clinical Social Work Assessment  Patient Details  Name: Ricardo Johnson MRN: [id number] Date of Birth: 09-22-1945  Date of referral:  10/07/17               Reason for consult:  Facility Placement                Permission sought to share information with:  Case Manager, Customer service manager, Family Supports Permission granted to share information::  No(Patient sedated)  Name::     Ricardo Johnson  Agency::  Hospice  Relationship::  Daughter  Contact Information:     Housing/Transportation Living arrangements for the past 2 months:  Single Family Home Source of Information:  Adult Children Patient Interpreter Needed:  None Criminal Activity/Legal Involvement Pertinent to Current Situation/Hospitalization:  No - Comment as needed Significant Relationships:  Adult Children Lives with:  Self Do you feel safe going back to the place where you live?    Need for family participation in patient care:     Care giving concerns:  Patient's daughter, Arbie Cookey, reports that patient lives alone and needs assistance with ADLs. Arbie Cookey states that she lives close to patient and comes by daily to assist him.    Social Worker assessment / plan:  LCSW following for residential hospice placement.   LCSW spoke with patients daughter by phone. LCSW explained role and reason for call.   Per notes Daughter Glendell Docker spoke with attending and oncology MD about disposition for patient. LCSW discussed recommendation for hospice. Arbie Cookey expressed that she was agreeable and preferred [employer] [facility].  LCSW explained hospice referral process.   Arbie Cookey stated that for the past couple months patient has declined. Daughter states that patient required assistance with all ADLs. Daughter stated that patient is not always willing to accept assistance. Arbie Cookey reports that patient uses a cane at baseline.   Employment status:  Retired Nurse, adult PT Recommendations:  Not assessed at this  time [place] / Referral to community resources:     Patient/Family's Response to care:  Arbie Cookey thankful for [employer].   Patient/Family's Understanding of and Emotional Response to Diagnosis, Current Treatment, and Prognosis:  Unable to assess. Arbie Cookey asked questions about how soon patient would be transferred. LCSW explained if there was a bed available he would transfer after paperwork is complete. Arbie Cookey expressed understanding.   Emotional Assessment Appearance:  Appears stated age Attitude/Demeanor/Rapport:  Sedated Affect (typically observed):    Orientation:  Oriented to Self, Oriented to Place, Oriented to  Time, Oriented to Situation Alcohol / Substance use:  Not Applicable Psych involvement (Current and /or in the community):  No (Comment)  Discharge Needs  Concerns to be addressed:  No discharge needs identified Readmission within the last 30 days:  No Current discharge risk:  None Barriers to Discharge:  Continued Medical Work up   [employer], LCSW 10/08/2017, 9:22 AM

## 2017-11-15 DEATH — deceased

## 2019-04-30 IMAGING — XA IR NEPHROSTOMY PLACEMENT LEFT
6 series · 12 of 24 positions shown · non-contrast
Comparison: CT abdomen/pelvis 09/30/2017

INDICATION: 71-year-old male with bilateral hydronephrosis in the setting of
prostate cancer.

EXAM:
IR NEPHROSTOMY PLACEMENT RIGHT; IR NEPHROSTOMY PLACEMENT LEFT
TECHNIQUE: The procedure, risks, benefits, and alternatives were explained to
the patient. Questions regarding the procedure were encouraged and
answered. The patient understands and consents to the procedure.

[Series 1: fl - angio · 2 of 67 frames shown (1 of 5)]
[frame 11/67]
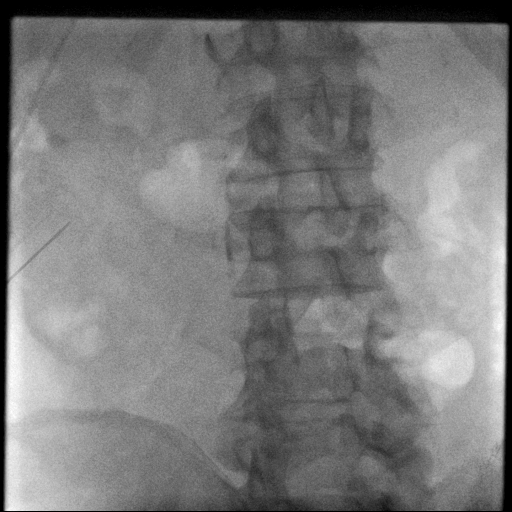
[frame 57/67]
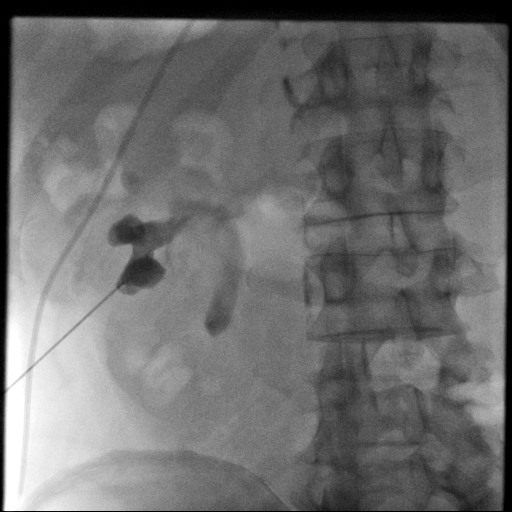

[Series 2: fl - angio · 1 of 3 frames shown (2 of 5)]
[frame 2/3]
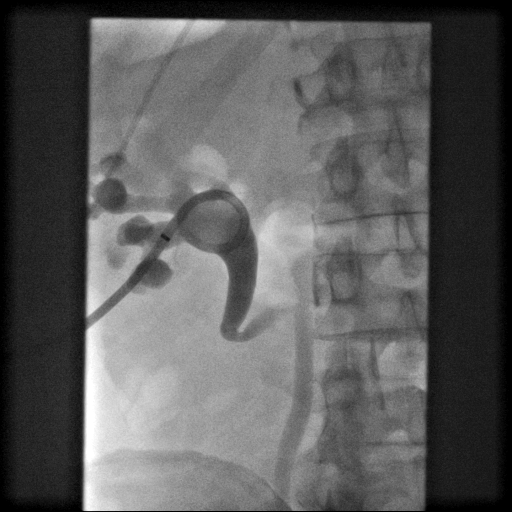

[Series 3: fl - angio · 2 of 73 frames shown (3 of 5)]
[frame 11/73]
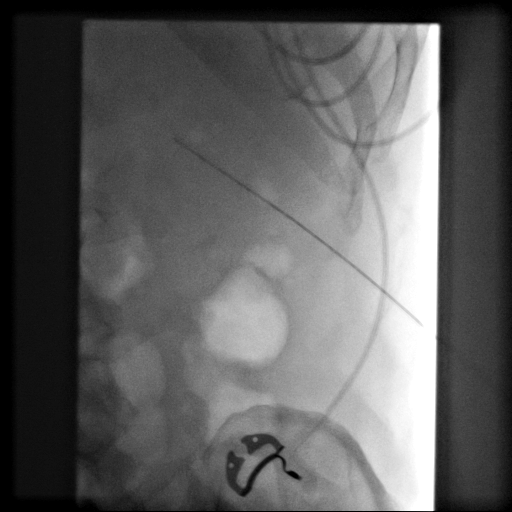
[frame 63/73]
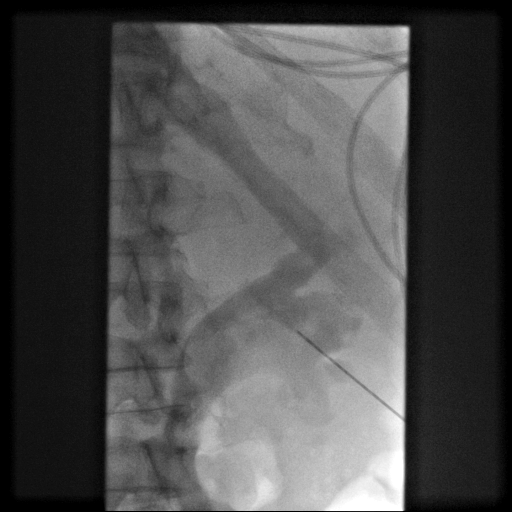

[Series 4: fl - angio · 2 of 114 frames shown (4 of 5)]
[frame 18/114]
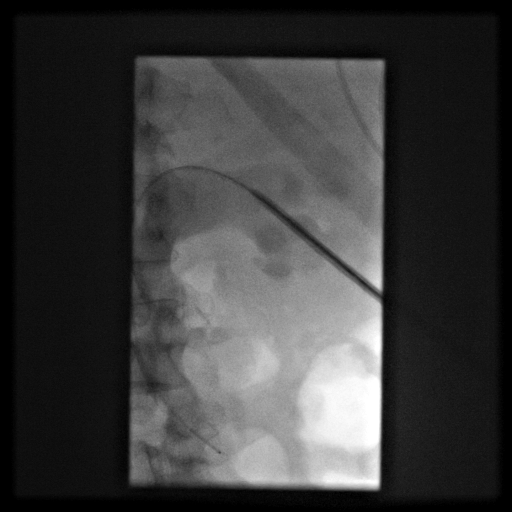
[frame 58/114]
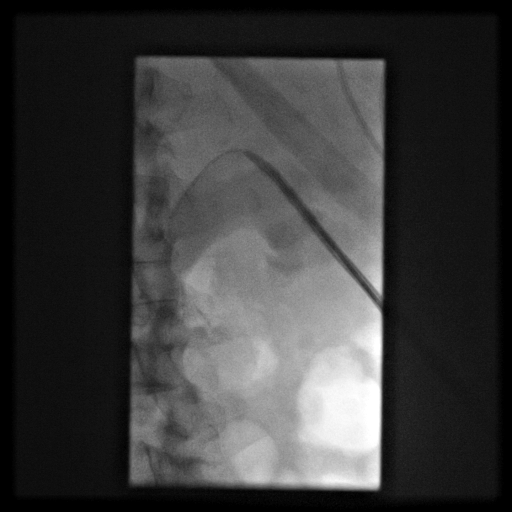

[Series 5: fl - angio · 2 of 20 frames shown (5 of 5)]
[frame 3/20]
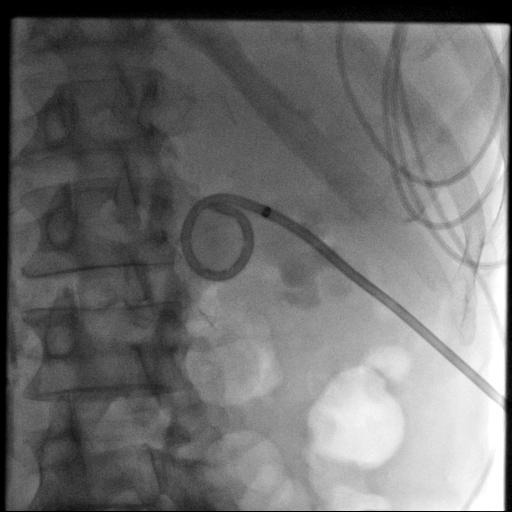
[frame 11/20]
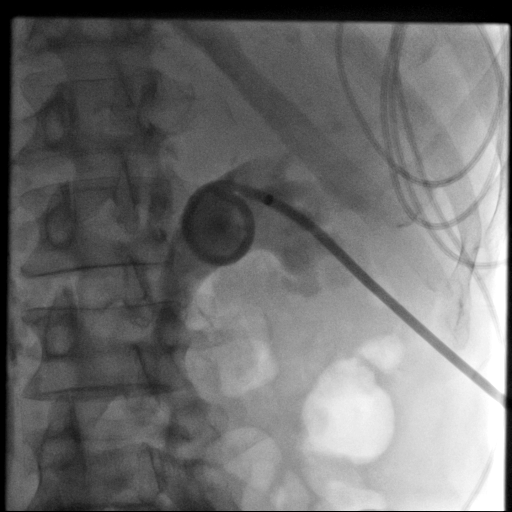

[Series 300: tube placements · 3 of 5 slices shown]
[im 1/5]
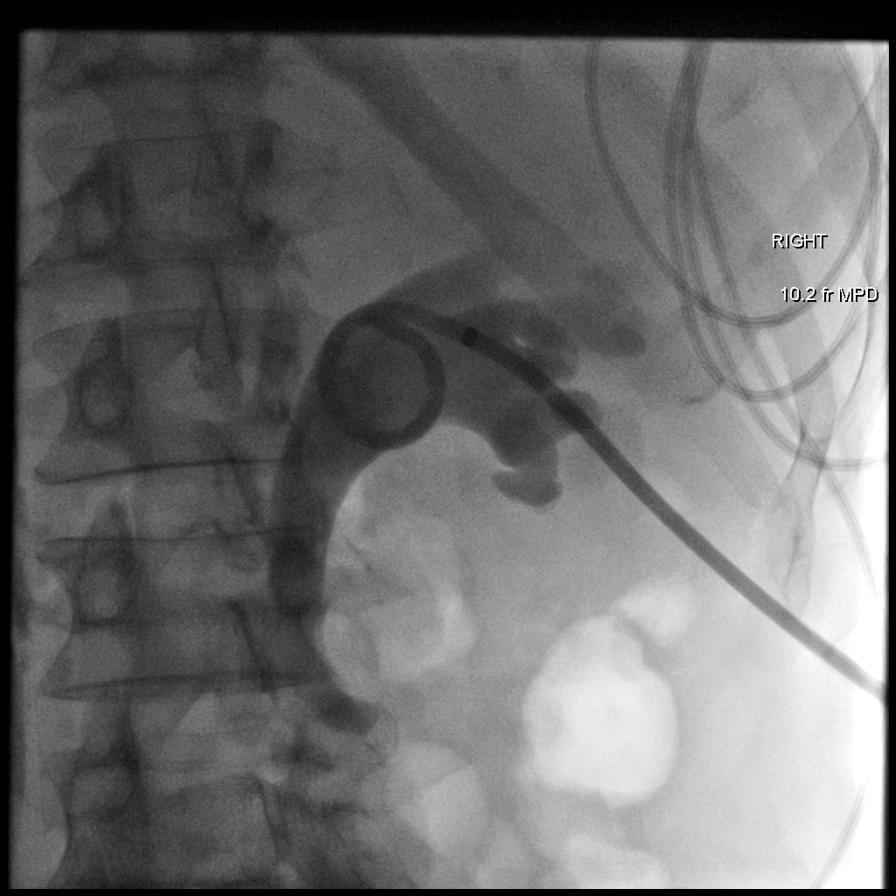
[im 3/5]
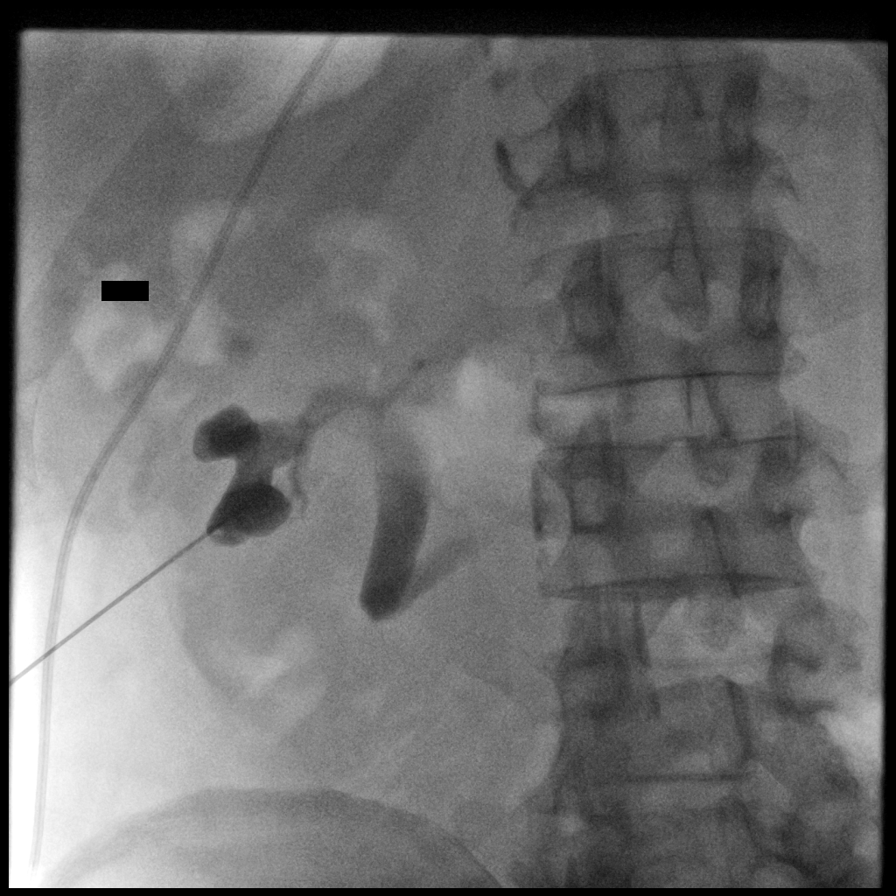
[im 5/5]
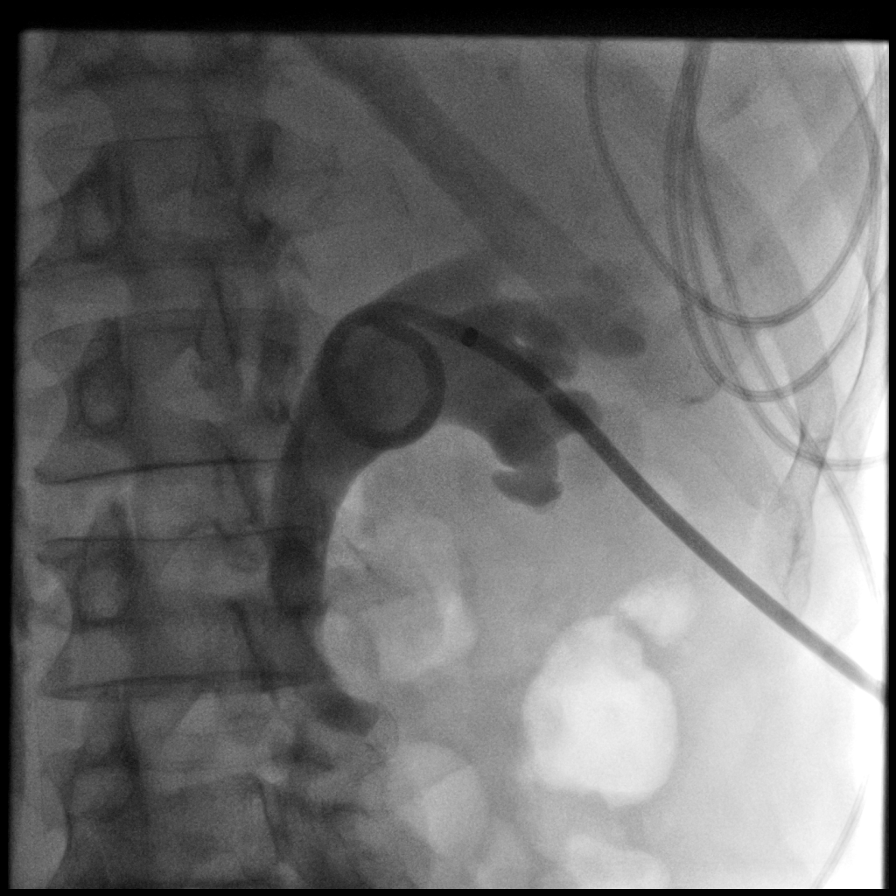

[12 of 24 positions shown; findings below may reference images not displayed]

MEDICATIONS:
1 g Rocephin; The antibiotic was administered in an appropriate time
frame prior to skin puncture.

ANESTHESIA/SEDATION:
Fentanyl 1 mcg IV; Versed 75 mg IV

Moderate Sedation Time:  23 minutes

The patient was continuously monitored during the procedure by the
interventional radiology nurse under my direct supervision.

CONTRAST:  20mL P0VD0Y-LGG IOPAMIDOL (P0VD0Y-LGG) INJECTION 61%,
20mL P0VD0Y-LGG IOPAMIDOL (P0VD0Y-LGG) INJECTION 61% - administered
into the collecting system(s)

FLUOROSCOPY TIME:  Fluoroscopy Time: 2 minutes 24 seconds (11 mGy).

COMPLICATIONS:
None immediate.
LEFT PCN

The left flank was prepped with chlorhexidine in a sterile fashion,
and a sterile drape was applied covering the operative field. A
sterile gown and sterile gloves were used for the procedure. Local
anesthesia was provided with 1% Lidocaine.

The left flank was interrogated with ultrasound and the left kidney
identified. The kidney is hydronephrotic. A suitable access site on
the skin overlying the lower pole, posterior calix was identified.
After local mg anesthesia was achieved, a small skin nick was made
with an 11 blade scalpel. A 21 gauge Accustick needle was then
advanced under direct sonographic guidance into the lower pole of
the left kidney. A 0.018 inch wire was advanced under fluoroscopic
guidance into the left renal collecting system. The Accustick sheath
was then advanced over the wire and a 0.018 system exchanged for a
0.035 system. Gentle hand injection of contrast material confirms
placement of the sheath within the renal collecting system. There is
hydronephrosis. The tract from the scan into the renal collecting
system was then dilated serially to 10-French. A 10-[employer]
Mthembeni Dibuseng was then placed and positioned under fluoroscopic
guidance. The locking loop is well formed within the left renal
pelvis. The catheter was secured to the skin with 2-0 Prolene and a
sterile bandage was placed. Catheter was left to gravity bag
drainage.

RIGHT PCN

The right flank was prepped with chlorhexidine in a sterile fashion,
and a sterile drape was applied covering the operative field. A
sterile gown and sterile gloves were used for the procedure. Local
anesthesia was provided with 1% Lidocaine.

The right flank was interrogated with ultrasound and the left kidney
identified. The kidney is hydronephrotic. A suitable access site on
the skin overlying the lower pole, posterior calix was identified.
After local mg anesthesia was achieved, a small skin nick was made
with an 11 blade scalpel. A 21 gauge Accustick needle was then
advanced under direct sonographic guidance into the lower pole of
the right kidney. A 0.018 inch wire was advanced under fluoroscopic
guidance into the left renal collecting system. The Accustick sheath
was then advanced over the wire and a 0.018 system exchanged for a
0.035 system. Gentle hand injection of contrast material confirms
placement of the sheath within the renal collecting system. There is
marked hydronephrosis. The tract from the scan into the renal
collecting system was then dilated serially to 10-French. A
10-Auntyjatty Delowr was then placed and positioned
under fluoroscopic guidance. The locking loop is well formed within
the left renal pelvis. The catheter was secured to the skin with 2-0
Prolene and a sterile bandage was placed. Catheter was left to
gravity bag drainage.
IMPRESSION: Successful placement of a bilateral 10 French percutaneous
nephrostomy tubes.
